# Patient Record
Sex: Male | Born: 1940
Health system: Southern US, Community
[De-identification: ages and names within clinical notes are randomized; demographics above are authoritative.]

## PROBLEM LIST (undated history)

## (undated) DIAGNOSIS — I739 Peripheral vascular disease, unspecified: Secondary | ICD-10-CM

## (undated) DIAGNOSIS — D649 Anemia, unspecified: Secondary | ICD-10-CM

## (undated) DIAGNOSIS — T7840XA Allergy, unspecified, initial encounter: Secondary | ICD-10-CM

## (undated) DIAGNOSIS — I10 Essential (primary) hypertension: Secondary | ICD-10-CM

## (undated) DIAGNOSIS — M1990 Unspecified osteoarthritis, unspecified site: Secondary | ICD-10-CM

## (undated) HISTORY — DX: Peripheral vascular disease, unspecified: I73.9

## (undated) HISTORY — DX: Allergy, unspecified, initial encounter: T78.40XA

## (undated) HISTORY — DX: Anemia, unspecified: D64.9

## (undated) HISTORY — DX: Unspecified osteoarthritis, unspecified site: M19.90

## (undated) HISTORY — DX: Essential (primary) hypertension: I10

## (undated) HISTORY — PX: NM MYOVIEW LTD: HXRAD82

---

## 2005-01-04 ENCOUNTER — Ambulatory Visit: Payer: Self-pay | Admitting: Internal Medicine

## 2005-02-12 ENCOUNTER — Ambulatory Visit: Payer: Self-pay | Admitting: Internal Medicine

## 2005-03-26 ENCOUNTER — Ambulatory Visit: Payer: Self-pay | Admitting: Internal Medicine

## 2005-07-12 ENCOUNTER — Ambulatory Visit: Payer: Self-pay | Admitting: Internal Medicine

## 2005-09-23 ENCOUNTER — Ambulatory Visit: Payer: Self-pay | Admitting: Internal Medicine

## 2005-09-23 LAB — CONVERTED CEMR LAB: PSA: 0.66 ng/mL

## 2005-10-10 ENCOUNTER — Ambulatory Visit: Payer: Self-pay | Admitting: Internal Medicine

## 2006-03-27 ENCOUNTER — Ambulatory Visit: Payer: Self-pay | Admitting: Internal Medicine

## 2006-04-04 ENCOUNTER — Ambulatory Visit: Payer: Self-pay

## 2006-04-11 ENCOUNTER — Ambulatory Visit: Payer: Self-pay | Admitting: Internal Medicine

## 2006-04-30 ENCOUNTER — Ambulatory Visit: Payer: Self-pay | Admitting: Internal Medicine

## 2006-07-16 ENCOUNTER — Ambulatory Visit: Payer: Self-pay | Admitting: Internal Medicine

## 2006-09-18 ENCOUNTER — Encounter: Payer: Self-pay | Admitting: Internal Medicine

## 2006-09-18 DIAGNOSIS — I10 Essential (primary) hypertension: Secondary | ICD-10-CM

## 2006-09-18 DIAGNOSIS — J301 Allergic rhinitis due to pollen: Secondary | ICD-10-CM | POA: Insufficient documentation

## 2006-09-18 DIAGNOSIS — D510 Vitamin B12 deficiency anemia due to intrinsic factor deficiency: Secondary | ICD-10-CM | POA: Insufficient documentation

## 2006-09-26 ENCOUNTER — Ambulatory Visit: Payer: Self-pay | Admitting: Internal Medicine

## 2006-09-29 LAB — CONVERTED CEMR LAB
Albumin: 3.7 g/dL (ref 3.5–5.2)
BUN: 19 mg/dL (ref 6–23)
Basophils Relative: 0.1 % (ref 0.0–1.0)
CO2: 29 meq/L (ref 19–32)
Calcium: 9 mg/dL (ref 8.4–10.5)
Creatinine, Ser: 1.3 mg/dL (ref 0.4–1.5)
Eosinophils Relative: 6.3 % — ABNORMAL HIGH (ref 0.0–5.0)
GFR calc non Af Amer: 59 mL/min
Monocytes Relative: 12.4 % — ABNORMAL HIGH (ref 3.0–11.0)
Neutro Abs: 3 10*3/uL (ref 1.4–7.7)
PSA: 0.7 ng/mL (ref 0.10–4.00)
Phosphorus: 3.1 mg/dL (ref 2.3–4.6)
Platelets: 235 10*3/uL (ref 150–400)
RBC: 4.55 M/uL (ref 4.22–5.81)
RDW: 12.4 % (ref 11.5–14.6)
TSH: 0.79 microintl units/mL (ref 0.35–5.50)
WBC: 5.5 10*3/uL (ref 4.5–10.5)

## 2006-10-16 ENCOUNTER — Ambulatory Visit: Payer: Self-pay | Admitting: Internal Medicine

## 2006-10-17 ENCOUNTER — Encounter (INDEPENDENT_AMBULATORY_CARE_PROVIDER_SITE_OTHER): Payer: Self-pay | Admitting: *Deleted

## 2007-03-04 ENCOUNTER — Ambulatory Visit: Payer: Self-pay | Admitting: Family Medicine

## 2007-05-29 ENCOUNTER — Ambulatory Visit: Payer: Self-pay | Admitting: Family Medicine

## 2007-07-03 ENCOUNTER — Ambulatory Visit: Payer: Self-pay | Admitting: Internal Medicine

## 2007-07-20 ENCOUNTER — Ambulatory Visit: Payer: Self-pay | Admitting: Gastroenterology

## 2008-01-05 ENCOUNTER — Ambulatory Visit: Payer: Self-pay | Admitting: Internal Medicine

## 2008-01-06 LAB — CONVERTED CEMR LAB
ALT: 31 units/L (ref 0–53)
AST: 27 units/L (ref 0–37)
Alkaline Phosphatase: 63 units/L (ref 39–117)
BUN: 20 mg/dL (ref 6–23)
Basophils Absolute: 0 10*3/uL (ref 0.0–0.1)
CO2: 28 meq/L (ref 19–32)
Calcium: 8.9 mg/dL (ref 8.4–10.5)
Creatinine, Ser: 1.5 mg/dL (ref 0.4–1.5)
Glucose, Bld: 90 mg/dL (ref 70–99)
Hemoglobin: 14.1 g/dL (ref 13.0–17.0)
Lymphocytes Relative: 20.1 % (ref 12.0–46.0)
MCHC: 35 g/dL (ref 30.0–36.0)
Monocytes Relative: 10.5 % (ref 3.0–12.0)
Neutrophils Relative %: 65.5 % (ref 43.0–77.0)
Phosphorus: 3.3 mg/dL (ref 2.3–4.6)
RBC: 4.49 M/uL (ref 4.22–5.81)
RDW: 12 % (ref 11.5–14.6)
Total Bilirubin: 1.4 mg/dL — ABNORMAL HIGH (ref 0.3–1.2)

## 2008-01-12 ENCOUNTER — Ambulatory Visit: Payer: Self-pay | Admitting: Family Medicine

## 2008-01-25 ENCOUNTER — Ambulatory Visit: Payer: Self-pay | Admitting: Family Medicine

## 2008-07-05 ENCOUNTER — Ambulatory Visit: Payer: Self-pay | Admitting: Internal Medicine

## 2008-07-05 DIAGNOSIS — M1990 Unspecified osteoarthritis, unspecified site: Secondary | ICD-10-CM | POA: Insufficient documentation

## 2008-08-15 ENCOUNTER — Ambulatory Visit: Payer: Self-pay | Admitting: Gastroenterology

## 2008-08-26 ENCOUNTER — Ambulatory Visit: Payer: Self-pay | Admitting: Gastroenterology

## 2008-08-26 ENCOUNTER — Encounter: Payer: Self-pay | Admitting: Gastroenterology

## 2008-08-29 ENCOUNTER — Encounter: Payer: Self-pay | Admitting: Gastroenterology

## 2008-10-24 ENCOUNTER — Ambulatory Visit: Payer: Self-pay | Admitting: Internal Medicine

## 2009-01-16 ENCOUNTER — Ambulatory Visit: Payer: Self-pay | Admitting: Internal Medicine

## 2009-01-17 LAB — CONVERTED CEMR LAB
ALT: 25 units/L (ref 0–53)
Albumin: 3.9 g/dL (ref 3.5–5.2)
BUN: 21 mg/dL (ref 6–23)
Basophils Relative: 0.5 % (ref 0.0–3.0)
Bilirubin, Direct: 0.1 mg/dL (ref 0.0–0.3)
Calcium: 8.9 mg/dL (ref 8.4–10.5)
Eosinophils Absolute: 0.3 10*3/uL (ref 0.0–0.7)
Glucose, Bld: 90 mg/dL (ref 70–99)
HCT: 40.8 % (ref 39.0–52.0)
Hemoglobin: 14.2 g/dL (ref 13.0–17.0)
Lymphocytes Relative: 24.8 % (ref 12.0–46.0)
Lymphs Abs: 1.5 10*3/uL (ref 0.7–4.0)
MCHC: 34.8 g/dL (ref 30.0–36.0)
MCV: 90.6 fL (ref 78.0–100.0)
Monocytes Absolute: 0.8 10*3/uL (ref 0.1–1.0)
Neutro Abs: 3.5 10*3/uL (ref 1.4–7.7)
Potassium: 4.4 meq/L (ref 3.5–5.1)
RBC: 4.51 M/uL (ref 4.22–5.81)
Total Protein: 7.3 g/dL (ref 6.0–8.3)

## 2009-02-02 ENCOUNTER — Ambulatory Visit: Payer: Self-pay | Admitting: Family Medicine

## 2009-07-18 ENCOUNTER — Ambulatory Visit: Payer: Self-pay | Admitting: Internal Medicine

## 2009-07-18 DIAGNOSIS — K5289 Other specified noninfective gastroenteritis and colitis: Secondary | ICD-10-CM

## 2009-08-09 ENCOUNTER — Ambulatory Visit: Payer: Self-pay | Admitting: Family Medicine

## 2009-08-09 DIAGNOSIS — L255 Unspecified contact dermatitis due to plants, except food: Secondary | ICD-10-CM

## 2009-08-21 ENCOUNTER — Ambulatory Visit: Payer: Self-pay | Admitting: Family Medicine

## 2010-01-17 ENCOUNTER — Ambulatory Visit: Payer: Self-pay | Admitting: Internal Medicine

## 2010-01-18 LAB — CONVERTED CEMR LAB
Albumin: 3.8 g/dL (ref 3.5–5.2)
Bilirubin, Direct: 0.1 mg/dL (ref 0.0–0.3)
Calcium: 8.9 mg/dL (ref 8.4–10.5)
Creatinine, Ser: 1.6 mg/dL — ABNORMAL HIGH (ref 0.4–1.5)
Eosinophils Absolute: 0.3 10*3/uL (ref 0.0–0.7)
Glucose, Bld: 88 mg/dL (ref 70–99)
MCHC: 34.3 g/dL (ref 30.0–36.0)
MCV: 89.8 fL (ref 78.0–100.0)
Monocytes Absolute: 0.7 10*3/uL (ref 0.1–1.0)
Neutrophils Relative %: 60.9 % (ref 43.0–77.0)
Platelets: 217 10*3/uL (ref 150.0–400.0)
Sodium: 139 meq/L (ref 135–145)
Total Bilirubin: 1.1 mg/dL (ref 0.3–1.2)
WBC: 6.3 10*3/uL (ref 4.5–10.5)

## 2010-03-06 NOTE — Assessment & Plan Note (Signed)
Summary: Darren IVEY/DLO   Vital Signs:  Patient profile:   70 year old male Height:      72 inches Weight:      188.25 pounds BMI:     25.62 Temp:     98.1 degrees F oral Pulse rate:   76 / minute Pulse rhythm:   regular BP sitting:   138 / 76  (left arm) Cuff size:   large  Vitals Entered By: Lewanda Rife LPN (August 10, 6071 12:41 PM) CC: ?Darren Atkins on legs and arms   History of Present Illness: got out in [place] recently - now on arms and legs and ankles and itches all night long  started with benadryl and calamine lotion ankles are better  arms and legs are not  still getting new spots   day of exposure close to 2 weeks ago   did wash his clothes afterwards  no animals exposed     Allergies: 1)  * Sulfa (Sulfonamides) Group 2)  Dyazide (Triamterene-Hctz)  Past History:  Past Medical History: Last updated: 07/05/2008 Allergic rhinitis Hypertension Pernicious anemia Osteoarthritis  Past Surgical History: Last updated: 10/03/2006 [place]. Myoview- apical thinning, no ischemia EF 53% 02/08  Family History: Last updated: 10/03/06 Dad died @58  of cerebral hemorrhage. Had HTN Mom died @86 --old age.  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 Walks a lot at work  Risk Factors: Smoking Status: never (2006/10/03)  Review of Systems General:  Denies fatigue, loss of appetite, and malaise. Eyes:  Denies blurring, discharge, and eye irritation. CV:  Denies palpitations. Resp:  Denies cough, shortness of breath, and wheezing. GI:  Denies nausea and vomiting. MS:  Denies joint pain. Derm:  Complains of itching and rash; denies poor wound healing. Heme:  Denies abnormal bruising  and bleeding.  Physical Exam  General:  alert and normal appearance.   Head:  normocephalic, atraumatic, and no abnormalities observed.  no rash on face  Eyes:  vision grossly intact, pupils equal, pupils round, pupils reactive to light, and no injection.   Mouth:  pharynx pink and moist.   Neck:  No deformities, masses, or tenderness noted. Lungs:  Normal respiratory effort, chest expands symmetrically. Lungs are clear to auscultation, no crackles or wheezes. Heart:  Normal rate and regular rhythm. S1 and S2 normal without gallop, murmur, click, rub or other extra sounds. Skin:  diffuse rash of erythematous vesicles over arms and legs in diff stages of healing and scab no signs of bacterial infection some in linear distribution Cervical Nodes:  No lymphadenopathy noted Psych:  normal affect, talkative and pleasant    Impression & Recommendations:  Problem # 1:  PLANT DERMATITIS (ICD-692.6) Assessment New  [place] arms and legs - not fully resolved after 2 weeks  will cover with pred 40 mg taper and update disc poss side eff- hunger/ anx / and infection risk  adv to keep clean try zyrtec for itch calamine ok  pt advised to update me if symptoms worsen or do not improve - esp if redness or fever  His updated medication list for this problem includes:    Prednisone 10 Mg Tabs (Prednisone) .Marland Kitchen... Take by mouth as directed  Orders: Prescription Created Electronically [id number])  Complete Medication List: 1)  Amlodipine Besylate 5 Mg Tabs (Amlodipine besylate) .... Take 1 tablet by mouth once a day 2)  Cyanocobalamin 1000 Mcg/ml Inj Soln (Cyanocobalamin) .... Inject one cc once monthly 3)  Fluticasone Propionate 50 Mcg/act Susp (Fluticasone propionate) .... 2 sprays each nostril once daily as needed 4)  Folic Acid 1 Mg Tabs (Folic acid) .... Take one by mouth once a day 5)  Multivitamins Tabs (Multiple vitamin) .... Take one by mouth once a day 6)  Dulcolax Stool Softener 100 Mg  Caps (Docusate sodium) .... Nightly as needed 7)  Calamine Lotn (Calamine) .... Otc as directed. 8)  Prednisone 10 Mg Tabs (Prednisone) .... Take by mouth as directed  Patient Instructions: 1)  continue the calamine lotion as needed  2)  try zyrtec 10 mg one pill daily for itch  3)  keep areas clean and dry  4)  take the prednisone as follows 5)  4 pills once daily for 3 days then  6)  3 pills once daily for 3 days then  7)  2 pills once daily for 3 days and then  8)  1 pill once daily for 3 days and then stop  9)  update me if worse or not improved in 1 weeks 10)  wash anything that touched the plant   Prescriptions: PREDNISONE 10 MG TABS (PREDNISONE) take by mouth as directed  #30 x 0   Entered and Authorized by:   Judith Part MD   Signed by:   Judith Part MD on 08/09/2009   Method used:   Electronically to        [facility] [id number]* (retail)       [address]       [place], [place]  [address]       Ph: [phone]       Fax: [phone]   RxID:   [id number]   Current Allergies (reviewed today): * SULFA (SULFONAMIDES) GROUP DYAZIDE (TRIAMTERENE-HCTZ)

## 2010-03-06 NOTE — Assessment & Plan Note (Signed)
Summary: 6 Month follow up   Vital Signs:  Patient profile:   70 year old male Weight:      187 pounds Temp:     98.3 degrees F oral Pulse rate:   64 / minute Pulse rhythm:   regular BP sitting:   140 / 80  (left arm) Cuff size:   large  Vitals Entered By: Mervin Hack CMA [place] [facility]) (July 18, 2009 10:28 AM) CC: 6 month follow-up   History of Present Illness: Doing fine Has stomach virus last week may have lost some weight with it---- couldn't eat for a while took 4-5 days to get back to normal no fever Bloating and some diarrhea  Occ checks BP--not in some time No headaches No chest pain No cheange in exercise tolerance Stays busy despite retirement---keeps cows, goat, farm  allergies have been good  No arthritis pain of note only mild in hands--no regular meds  Allergies: 1)  * Sulfa (Sulfonamides) Group 2)  Dyazide (Triamterene-Hctz)  Past History:  Past medical, surgical, family and social histories (including risk factors) reviewed for relevance to current acute and chronic problems.  Past Medical History: Reviewed history from 07/05/2008 and no changes required. Allergic rhinitis Hypertension Pernicious anemia Osteoarthritis  Past Surgical History: Reviewed history from 09/18/2006 and no changes required. [place]. Myoview- apical thinning, no ischemia EF 53% 02/08  Family History: Reviewed history from 09/18/2006 and no changes required. Dad died @58  of cerebral hemorrhage. Had HTN Mom died @86 --old age.  Had osteroporosis, HTN No CAD, DM, or cancer  Social History: Reviewed history from 01/16/2009 and no changes required. 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 Walks a lot at work  Physical Exam  General:  alert and normal appearance.   Neck:  supple,  no masses, no thyromegaly, no carotid bruits, and no cervical lymphadenopathy.   Lungs:  normal respiratory effort and normal breath sounds.   Heart:  normal rate, regular rhythm, no murmur, and no gallop.   Abdomen:  soft, non-tender, and no masses.   Msk:  no joint tenderness and no joint swelling.   Extremities:  no edema Psych:  normally interactive, good eye contact, not anxious appearing, and not depressed appearing.     Impression & Recommendations:  Problem # 1:  GASTROENTERITIS (ICD-558.9) Assessment New may just have been viral infection could have been diverticulitis though discussed criteria for evaluation and possible antibiotics if this happens again  Problem # 2:  HYPERTENSION (ICD-401.9) Assessment: Unchanged good control no changes needed  His updated medication list for this problem includes:    Amlodipine Besylate 5 Mg Tabs (Amlodipine besylate) .Marland Kitchen... Take 1 tablet by mouth once a day  BP today: 140/80 Prior BP: 128/78 (02/02/2009)  Labs Reviewed: K+: 4.4 (01/16/2009) Creat: : 1.6 (01/16/2009)     Problem # 3:  OSTEOARTHRITIS (ICD-715.90) Assessment: Unchanged mild symptoms no Rx needed  Problem # 4:  ALLERGIC RHINITIS (ICD-477.9) Assessment: Unchanged got through the season pretty well  His updated medication list for this problem includes:    Fluticasone Propionate 50 Mcg/act Susp (Fluticasone propionate) .Marland Kitchen... 2 sprays each nostril once daily as needed  Complete Medication List: 1)  Amlodipine Besylate 5 Mg Tabs (Amlodipine besylate) .... Take 1 tablet by mouth once a day 2)  Cyanocobalamin 1000 Mcg/ml Inj Soln (Cyanocobalamin) .... Inject  one cc once monthly 3)  Fluticasone Propionate 50 Mcg/act Susp (Fluticasone propionate) .... 2 sprays each nostril once daily as needed 4)  Folic Acid 1 Mg Tabs (Folic acid) .... Take one by mouth once a day 5)  Multivitamins Tabs (Multiple vitamin) .... Take one by mouth once a day 6)  Dulcolax Stool Softener  100 Mg Caps (Docusate sodium) .... Nightly as needed  Patient Instructions: 1)  Please schedule a follow-up appointment in 6 months for physical  Current Allergies (reviewed today): * SULFA (SULFONAMIDES) GROUP DYAZIDE (TRIAMTERENE-HCTZ)

## 2010-03-06 NOTE — Assessment & Plan Note (Signed)
Summary: POISON OAK/CLE   Vital Signs:  Patient profile:   70 year old male Weight:      186.13 pounds Temp:     98.9 degrees F oral Pulse rate:   94 / minute Pulse rhythm:   regular BP sitting:   178 / 81  (left arm) Cuff size:   large  Vitals Entered By: Janee Morn CMA (August 21, 2009 3:24 PM) CC: Poison oak   History of Present Illness: 70 yo here for persistent poison ivy. Seen on 7/6 for presumed poison ivy on arms, legs and ankles. Given prednisone taper.  Felt much better by day 3 of his prednisone but once he finsihed it on Friday, itching started back again. Zyrtec not helping. Itching woke him up last night. Now also has some spots on his arms, does not think the plant touched his arms.  did wash his clothes afterwards  no animals exposed   Washed all of his sheets, shoes, clothes.  Current Medications (verified): 1)  Amlodipine Besylate 5 Mg Tabs (Amlodipine Besylate) .... Take 1 Tablet By Mouth Once A Day 2)  Cyanocobalamin 1000 Mcg/ml Inj Soln (Cyanocobalamin) .... Inject One Cc Once Monthly 3)  Fluticasone Propionate 50 Mcg/act  Susp (Fluticasone Propionate) .... 2 Sprays Each Nostril Once Daily As Needed 4)  Folic Acid 1 Mg  Tabs (Folic Acid) .... Take One By Mouth Once A Day 5)  Multivitamins   Tabs (Multiple Vitamin) .... Take One By Mouth Once A Day 6)  Dulcolax Stool Softener 100 Mg  Caps (Docusate Sodium) .... Nightly As Needed 7)  Calamine  Lotn (Calamine) .... Otc As Directed. 8)  Prednisone 10 Mg  Tabs (Prednisone) .... Take By Mouth Every Morning As Directed.  Allergies: 1)  * Sulfa (Sulfonamides) Group 2)  Dyazide (Triamterene-Hctz)  Review of Systems      See HPI General:  Denies fever. GI:  Denies nausea.  Physical Exam  General:  alert and normal appearance.   Skin:  diffuse rash of erythematous vesicles over arms, ankles, and legs in diff stages of healing and scab no signs of bacterial infection some in linear distribution Psych:   normal affect, talkative and pleasant    Impression & Recommendations:  Problem # 1:  PLANT DERMATITIS (ICD-692.6) Assessment Deteriorated Discussed with pt.  Decadron IM vs oral steroid burst again. He prefers oral tablets.  Will prescirbe another course, advised oral benadryl at night. Advised to call me on Friday with update. The following medications were removed from the medication list:    Prednisone 10 Mg Tabs (Prednisone) .Marland Kitchen... Take by mouth as directed His updated medication list for this problem includes:    Prednisone 10 Mg Tabs (Prednisone) .Marland Kitchen... Take by mouth every morning as directed.  Complete Medication List: 1)  Amlodipine Besylate 5 Mg Tabs (Amlodipine besylate) .... Take 1 tablet by mouth once a day 2)  Cyanocobalamin 1000 Mcg/ml Inj Soln (Cyanocobalamin) .... Inject one cc once monthly 3)  Fluticasone Propionate 50 Mcg/act Susp (Fluticasone propionate) .... 2 sprays each nostril once daily as needed 4)  Folic Acid 1 Mg Tabs (Folic acid) .... Take one by mouth once a day 5)  Multivitamins Tabs (Multiple vitamin) .... Take one by mouth once a day 6)  Dulcolax Stool Softener 100 Mg Caps (Docusate sodium) .... Nightly as needed 7)  Calamine Lotn (Calamine) .... Otc as directed. 8)  Prednisone 10 Mg Tabs (Prednisone) .... Take by mouth every morning as directed.  Patient Instructions: 1)  continue the calamine lotion as needed  2)  try benadryl tablets at bedtime. 3)  keep areas clean and dry  4)  take the prednisone as follows 5)  4 pills once daily for 3 days then  6)  3 pills once daily for 3 days then  7)  2 pills once daily for 3 days and then  8)  1 pill once daily for 3 days and then stop  9)  update me if worse or not improved in 1 weeks 10)  wash anything that touched the plant  Prescriptions: PREDNISONE 10 MG  TABS (PREDNISONE) Take by mouth every morning as directed.  #30 x 0   Entered and Authorized by:   Ruthe Mannan MD   Signed by:   Ruthe Mannan MD on  08/21/2009   Method used:   Electronically to        [facility] [id number]* (retail)       [address]       [place], [place]  [address]       Ph: [phone]       Fax: [phone]   RxID:   [id number]   Current Allergies (reviewed today): * SULFA (SULFONAMIDES) GROUP DYAZIDE (TRIAMTERENE-HCTZ)

## 2010-03-08 NOTE — Assessment & Plan Note (Signed)
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 He helps in and out of tub, dries hair. Occ needs help even with transfers out of bed  No new concerns for him  Allergies: 1)  * Sulfa (Sulfonamides) Group 2)  Dyazide (Triamterene-Hctz)  Past History:  Past medical, surgical, family and social histories (including risk factors) reviewed for relevance to current acute and chronic problems.  Past Medical History: Reviewed history from 07/05/2008 and no changes required. Allergic rhinitis Hypertension Pernicious anemia Osteoarthritis  Past Surgical History: Reviewed history from 09/18/2006 and no changes required. [place]. Myoview- apical thinning, no ischemia EF 53% 02/08  Family History: Reviewed history from 09/18/2006 and no changes required. Dad died @58  of cerebral hemorrhage. Had HTN Mom died @86 --old age.  Had osteroporosis, HTN No CAD, DM, or cancer  Social History: Reviewed history from 01/16/2009 and no changes required. 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 Walks a lot at work  Review of Systems General:  weight up 3# sleeps okay wears seat belt . Eyes:  Denies double vision and vision loss-1 eye. ENT:  Denies decreased hearing and ringing in ears; full dentures. CV:  Denies chest pain or  discomfort, difficulty breathing at night, difficulty breathing while lying down, fainting, lightheadness, palpitations, and shortness of breath with exertion. Resp:  Denies cough and shortness of breath. GI:  Denies abdominal pain, bloody stools, change in bowel habits, dark tarry stools, indigestion, nausea, and vomiting. GU:  Complains of nocturia; denies urinary frequency and urinary hesitancy; nocturia x 1 at most No sexual problems. MS:  Denies joint pain and joint swelling. Derm:  Denies lesion(s) and rash. Neuro:  Complains of headaches; denies numbness, tingling, and weakness; occ headaches---advil cold and sinus helps. Psych:  Denies anxiety and depression. Heme:  Denies abnormal bruising and enlarge lymph nodes. Allergy:  Complains of seasonal allergies and sneezing; uses fluticasone seasonally.  Physical Exam  General:  alert and normal appearance.   Eyes:  pupils equal, pupils round, pupils reactive to light, and no optic disk abnormalities.   Ears:  R ear normal and L ear normal.   Mouth:  no erythema, no exudates, and no lesions.   Neck:  supple, no masses, no thyromegaly, no carotid bruits, and no cervical lymphadenopathy.   Lungs:  normal respiratory effort, no intercostal retractions, no accessory muscle use, and normal breath sounds.   Heart:  normal rate, regular rhythm, no murmur, and no gallop.   Abdomen:  soft, non-tender, and no masses.   Msk:  no joint tenderness and no joint swelling.   Pulses:  normal in feet Extremities:  no edema Neurologic:  alert & oriented X3, strength normal in all extremities, and gait normal.   Skin:  no rashes and no  suspicious lesions.   Axillary Nodes:  No palpable lymphadenopathy Psych:  normally interactive, good eye contact, not anxious appearing, and not depressed appearing.     Impression & Recommendations:  Problem # 1:  HEALTH MAINTENANCE EXAM (ICD-V70.0) Assessment Comment Only Healthy discussed PSA--will not do  anymore up to date on imms and colonoscopy  Problem # 2:  HYPERTENSION (ICD-401.9) Assessment: Unchanged  BP control is fine no changes needed due for labs  His updated medication list for this problem includes:    Amlodipine Besylate 5 Mg Tabs (Amlodipine besylate) .Marland Kitchen... Take 1 tablet by mouth once a day  BP today: 148/88 Prior BP: 178/81 (08/21/2009)  Labs Reviewed: K+: 4.4 (01/16/2009) Creat: : 1.6 (01/16/2009)     Orders: Venipuncture ([address]) TLB-Renal Function Panel (80069-RENAL) TLB-CBC Platelet - w/Differential (85025-CBCD) TLB-Hepatic/Liver Function Pnl (80076-HEPATIC) TLB-TSH (Thyroid Stimulating Hormone) (84443-TSH)  Problem # 3:  ANEMIA, PERNICIOUS (ICD-281.0) Assessment: Comment Only he gives himself shots monthly  His updated medication list for this problem includes:    Cyanocobalamin 1000 Mcg/ml Inj Soln (Cyanocobalamin) ..... Inject one cc once monthly    Folic Acid 1 Mg Tabs (Folic acid) .Marland Kitchen... Take one by mouth once a day  Complete Medication List: 1)  Amlodipine Besylate 5 Mg Tabs (Amlodipine besylate) .... Take 1 tablet by mouth once a day 2)  Cyanocobalamin 1000 Mcg/ml Inj Soln (Cyanocobalamin) .... Inject one cc once monthly 3)  Fluticasone Propionate 50 Mcg/act Susp (Fluticasone propionate) .... 2 sprays each nostril once daily as needed 4)  Folic Acid 1 Mg Tabs (Folic acid) .... Take one by mouth once a day 5)  Multivitamins Tabs (Multiple vitamin) .... Take one by mouth once a day 6)  Dulcolax Stool Softener 100 Mg Caps (Docusate sodium) .... Nightly as needed 7)  Calamine Lotn (Calamine) .... Otc as directed.  Other Orders: Flu Vaccine 62yrs + MEDICARE PATIENTS (U0454) Administration Flu vaccine - MCR (U9811)  Patient Instructions: 1)  Please schedule a follow-up appointment in 6 months .    Orders Added: 1)  Flu Vaccine 31yrs + MEDICARE PATIENTS [Q2039] 2)  Administration Flu vaccine - MCR [G0008] 3)  Est. Patient 65& > [99397] 4)   Venipuncture [36415] 5)  TLB-Renal Function Panel [80069-RENAL] 6)  TLB-CBC Platelet - w/Differential [85025-CBCD] 7)  TLB-Hepatic/Liver Function Pnl [80076-HEPATIC] 8)  TLB-TSH (Thyroid Stimulating Hormone) [91478-GNF]  Flu Vaccine Consent Questions     Do you have a history of severe allergic reactions to this vaccine? no    Any prior history of allergic reactions to egg and/or gelatin? no    Do you have a sensitivity to the preservative Thimersol? no    Do you have a past history of Guillan-Barre Syndrome? no    Do you currently have an acute febrile illness? no    Have you ever had a severe reaction to latex? no    Vaccine information given and explained to patient? yes    Are you currently pregnant? no    Lot Number:[id number]   Exp Date:08/04/2010   Site Given  Left Deltoid IM Current Allergies (reviewed today): * SULFA (SULFONAMIDES) GROUP DYAZIDE (TRIAMTERENE-HCTZ)    .[email or web address]

## 2010-05-10 ENCOUNTER — Encounter: Payer: Self-pay | Admitting: Internal Medicine

## 2010-05-14 ENCOUNTER — Ambulatory Visit (INDEPENDENT_AMBULATORY_CARE_PROVIDER_SITE_OTHER): Payer: Medicare Other | Admitting: Internal Medicine

## 2010-05-14 ENCOUNTER — Encounter: Payer: Self-pay | Admitting: Internal Medicine

## 2010-05-14 VITALS — BP 140/80 | HR 74 | Temp 98.4°F | Ht 72.0 in | Wt 188.0 lb

## 2010-05-14 DIAGNOSIS — N529 Male erectile dysfunction, unspecified: Secondary | ICD-10-CM | POA: Insufficient documentation

## 2010-05-14 MED ORDER — VARDENAFIL HCL 20 MG PO TABS: 10.0000 mg | ORAL_TABLET | Freq: Every day | ORAL | Status: DC | PRN

## 2010-05-14 NOTE — Progress Notes (Signed)
  Subjective:    Patient ID: Darren Atkins, male    DOB: [birth year], 70 y.o.   MRN: [id number]  HPI Having some trouble with erection problems Slowly worsening over 6 months or more Over the past few weeks, unable to penetrate at all now Had been progressively worsening but now really bad  No urinary problems No daytime urgency or frequency Nocturia is not an issue  Past Medical History  Diagnosis Date  . Allergy   . Hypertension   . Anemia   . Osteoarthritis     Past Surgical History  Procedure Date  . Nm myoview ltd     [place]. Myoview- apical thinning, no ischemia EF 53% 02/08    Family History  Problem Relation Age of Onset  . Hypertension Mother   . Osteoporosis Mother   . Hypertension Father   . Coronary artery disease Neg Hx   . Diabetes Neg Hx   . Cancer Neg Hx     History   Social History  . Marital Status: Married    Spouse Name: N/A    Number of Children: 1  . Years of Education: N/A   Occupational History  . 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 Smoking status: Never Smoker   . Smokeless tobacco: Former Neurosurgeon    Types: Chew    Quit date: 02/05/1983  . Alcohol Use: No  . Drug Use: Not on file  . Sexually Active: Not on file   Other Topics Concern  . Not on file   Social History Narrative   Does pushups and situps every day. Walks a lot at work   Review of [employer] enough Appetite is gone Relationship is still strong despite her disability    Objective:   Physical Exam  Psychiatric: He has a normal mood and affect. His behavior is normal. Judgment and thought content normal.          Assessment & Plan:

## 2010-07-13 ENCOUNTER — Ambulatory Visit (INDEPENDENT_AMBULATORY_CARE_PROVIDER_SITE_OTHER): Payer: Medicare Other | Admitting: Internal Medicine

## 2010-07-13 ENCOUNTER — Encounter: Payer: Self-pay | Admitting: Internal Medicine

## 2010-07-13 DIAGNOSIS — M1990 Unspecified osteoarthritis, unspecified site: Secondary | ICD-10-CM

## 2010-07-13 DIAGNOSIS — J309 Allergic rhinitis, unspecified: Secondary | ICD-10-CM

## 2010-07-13 DIAGNOSIS — I10 Essential (primary) hypertension: Secondary | ICD-10-CM

## 2010-07-13 DIAGNOSIS — N529 Male erectile dysfunction, unspecified: Secondary | ICD-10-CM

## 2010-07-13 DIAGNOSIS — D510 Vitamin B12 deficiency anemia due to intrinsic factor deficiency: Secondary | ICD-10-CM

## 2010-07-13 NOTE — Assessment & Plan Note (Signed)
Mild symptoms Tylenol only for Rx

## 2010-07-13 NOTE — Assessment & Plan Note (Signed)
Still gives himself the injections

## 2010-07-13 NOTE — Assessment & Plan Note (Signed)
Not a bad spring but seems to have secondary infection now---likely viral If worsens next week, would Rx with amoxil

## 2010-07-13 NOTE — Assessment & Plan Note (Signed)
BP Readings from Last 3 Encounters:  07/13/10 140/80  05/14/10 140/80  01/17/10 148/88   Good control No changes

## 2010-07-13 NOTE — Assessment & Plan Note (Signed)
levitra works well but hasn't needed it as much now

## 2010-07-13 NOTE — Progress Notes (Signed)
Subjective:    Patient ID: Darren Atkins, male    DOB: Dec 09, 1940, 70 y.o.   MRN: [id number]  HPI He is doing fine Has a touch of sinus infection--using advil cold and sinus  Uses the fluticasone bid at times now Gums are sore Started 2-3 days and is some better  Allergies not that bad this spring  Monitors BP at home Usually okay---doesn't remember systolic but diastolic under 90 No chest pain No SOB No edema  Occ mild arthritis pain Tylenol arthritis may help some No limitations in activity Notes esp CMC pain at times  Still gives vitamin b12 shots  Current Outpatient Prescriptions on File Prior to Visit  Medication Sig Dispense Refill  . amLODipine (NORVASC) 5 MG tablet Take 5 mg by mouth daily.        . cyanocobalamin (,VITAMIN B-12,) 1000 MCG/ML injection Inject 1,000 mcg into the muscle every 30 (thirty) days.        Marland Kitchen docusate sodium (COLACE) 100 MG capsule Take 100 mg by mouth at bedtime as needed.        . fluticasone (FLONASE) 50 MCG/ACT nasal spray 2 sprays by Nasal route daily as needed.        . folic acid (FOLVITE) 1 MG tablet Take 1 mg by mouth daily.        . Multiple Vitamin (MULTIVITAMIN) capsule Take 1 capsule by mouth daily.        Marland Kitchen DISCONTD: calamine lotion Apply topically as needed.         Past Medical History  Diagnosis Date  . Allergy   . Hypertension   . Anemia   . Osteoarthritis     Past Surgical History  Procedure Date  . Nm myoview ltd     Aden. Myoview- apical thinning, no ischemia EF 53% 02/08    Family History  Problem Relation Age of Onset  . Hypertension Mother   . Osteoporosis Mother   . Hypertension Father   . Coronary artery disease Neg Hx   . Diabetes Neg Hx   . Cancer Neg Hx     History   Social History  . Marital Status: Married    Spouse Name: N/A    Number of Children: 1  . Years of Education: N/A   Occupational History  . 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 Smoking status: Never Smoker   . Smokeless tobacco: Former Neurosurgeon    Types: Chew    Quit date: 02/05/1983  . Alcohol Use: No  . Drug Use: Not on file  . Sexually Active: Not on file   Other Topics Concern  . Not on file   Social History Narrative   Does pushups and situps every day. Walks a lot at work   Review of Systems Eats fine [employer] is stable Sleeps okay Still with same caregiving responsibilities for wife----pursuing deep brain stimulation    Objective:   Physical Exam  Constitutional: He appears well-developed and well-nourished. No distress.  HENT:  Mouth/Throat: Oropharynx is clear and moist. No oropharyngeal exudate.       TMs normal Marked inflammation R>L nare  Neck: Normal range of motion. Neck supple. No thyromegaly present.  Musculoskeletal: Normal range of motion. He exhibits no edema and no tenderness.  Lymphadenopathy:    He has no cervical adenopathy.  Psychiatric: He has a normal mood and affect.  His behavior is normal. Thought content normal.          Assessment & Plan:

## 2010-07-20 ENCOUNTER — Ambulatory Visit (INDEPENDENT_AMBULATORY_CARE_PROVIDER_SITE_OTHER): Payer: Medicare Other | Admitting: Family Medicine

## 2010-07-20 ENCOUNTER — Encounter: Payer: Self-pay | Admitting: Family Medicine

## 2010-07-20 VITALS — BP 148/80 | HR 80 | Temp 98.7°F | Ht 72.0 in | Wt 187.0 lb

## 2010-07-20 DIAGNOSIS — J019 Acute sinusitis, unspecified: Secondary | ICD-10-CM

## 2010-07-20 MED ORDER — AMOXICILLIN-POT CLAVULANATE 875-125 MG PO TABS: 1.0000 | ORAL_TABLET | Freq: Two times a day (BID) | ORAL | Status: AC

## 2010-07-20 NOTE — Patient Instructions (Signed)
You have a sinus infection. Take medicine as prescribed: augmentin twice daily for 10 days. Push fluids and plenty of rest.  Continue ibuprofen. Nasal saline irrigation to help drain sinuses.  Continue flonase. May use simple mucinex or guaifenesin with plenty of fluid to help mobilize mucous. Return if fever >101.5, trouble opening/closing mouth, difficulty swallowing, or worsening.

## 2010-07-20 NOTE — Assessment & Plan Note (Signed)
Recent worsening after initial improvement. See pt instructions for supportive care. Treat with augmentin twice daily x 10 days, update if not imporving.

## 2010-07-20 NOTE — Progress Notes (Signed)
  Subjective:    Patient ID: Darren Atkins, male    DOB: 1940-12-30, 70 y.o.   MRN: [id number]  HPI CC: sinus infx?  1+ wk h/o sinus sxs.  Started using advil cold/sinus.  Saw PCP for CPE last week.  Advised if not better to call him.  Things got better, then last night worsening again.  Thinks got into ragweed.  Slept sitting up.  Pain in sinuses on L side.  + congestion bilaterally.  Using flonase as well.  Not using nasal saline.  No abd pain, f/c/n/v/d, new rashes.  No ear pain or tooth pain.  H/o sinus infections in past.  No sick contacts at home, no smokers at home.  No asthma, COPD.  + h/o allergies.  Review of Systems Per HPI    Objective:   Physical Exam  Nursing note and vitals reviewed. Constitutional: He appears well-developed and well-nourished. No distress.  HENT:  Head: Normocephalic and atraumatic.  Right Ear: Tympanic membrane, external ear and ear canal normal.  Left Ear: Tympanic membrane, external ear and ear canal normal.  Nose: Nose normal. No mucosal edema or rhinorrhea. Right sinus exhibits no maxillary sinus tenderness and no frontal sinus tenderness. Left sinus exhibits no maxillary sinus tenderness and no frontal sinus tenderness.  Mouth/Throat: Mucous membranes are normal. Posterior oropharyngeal erythema present. No oropharyngeal exudate, posterior oropharyngeal edema or tonsillar abscesses.       Sinuses congested  Eyes: Conjunctivae and EOM are normal. Pupils are equal, round, and reactive to light. No scleral icterus.  Neck: Normal range of motion. Neck supple. No thyromegaly present.  Cardiovascular: Normal rate, regular rhythm, normal heart sounds and intact distal pulses.   No murmur heard. Pulmonary/Chest: Effort normal and breath sounds normal. No respiratory distress. He has no wheezes. He has no rales.  Lymphadenopathy:    He has no cervical adenopathy.  Skin: Skin is warm and dry. No rash noted.          Assessment & Plan:

## 2010-09-17 ENCOUNTER — Encounter: Payer: Self-pay | Admitting: Family Medicine

## 2010-09-17 ENCOUNTER — Ambulatory Visit (INDEPENDENT_AMBULATORY_CARE_PROVIDER_SITE_OTHER): Payer: Medicare Other | Admitting: Family Medicine

## 2010-09-17 VITALS — BP 156/82 | HR 64 | Temp 98.3°F | Wt 188.8 lb

## 2010-09-17 DIAGNOSIS — M7042 Prepatellar bursitis, left knee: Secondary | ICD-10-CM | POA: Insufficient documentation

## 2010-09-17 DIAGNOSIS — M7040 Prepatellar bursitis, unspecified knee: Secondary | ICD-10-CM

## 2010-09-17 MED ORDER — NAPROXEN 500 MG PO TABS: ORAL_TABLET | ORAL | Status: DC

## 2010-09-17 NOTE — Assessment & Plan Note (Addendum)
Anticipate due to trauma. Aspiration performed - bloody.  Likely trauma. Fluid not sent for analysis. Discussed NSAID use, use of knee brace to help avoid future return.

## 2010-09-17 NOTE — Patient Instructions (Signed)
Likely from repetitive clutch use. Drained today, sent for analysis. Treat with compression bandage for 24-36 hours, then consider using pull on knee brace or neoprene knee brace. Anti inflammatory for next few days (sent to pharmacy.)  Ice to knee as well as keeping elevated. Update [place] if any concerns. Prepatellar Bursitis with Rehab    Bursitis is a condition that is characterized by inflammation of a bursa. Kateri Mc exists in many areas of the body. They are fluid filled sacs that lie between a soft tissue (skin, tendon, or ligament) and a bone, and they reduce friction between the structures as well as the stress placed on the soft tissue. Prepatellar bursitis is inflammation of the bursa that lies between the skin and the kneecap (patella). This condition often causes pain over the patella.   SYMPTOMS  Pain, tenderness, and/or inflammation over the patella.  Pain that worsens with movement of the knee joint.  Decreased range of motion for the knee joint.  A crackling sound (crepitation) when the bursa is moved or touched.  Occasionally, painless swelling of the bursa.  Fever (when infected).   CAUSES  Bursitis is caused by damage to the bursa, which results in an inflammatory response. Common mechanisms of injury include:  Direct trauma to the front of the knee.  Repetitive and/ or stressful use of the knee.   RISK INCREASES WITH   Activities in which kneeling and/or falling on one's knees is likely (volleyball or football).  Repetitive and stressful training, especially if it involves running on hills.  Improper training techniques, such as a sudden increase in the intensity, frequency or duration of training.  Failure to warm-up properly before activity.  Poor technique.  Artificial turf.   PREVENTIVE MEASURES   Avoid kneeling or falling on your knees.  Warm up and stretch properly before activity.  Allow for adequate recovery between workouts.  Maintain  physical fitness: l Strength, flexibility, and endurance. l Cardiovascular fitness.  Learn and use proper technique. When possible, a have coach correct improper technique.  Wear properly fitted and padded protective equipment (knee pads).   PROGNOSIS If treated properly, then the symptoms of prepatellar bursitis usually resolve within 2 weeks.   POSSIBLE COMPLICATIONS   Recurrent symptoms that result in a chronic problem.  Prolonged healing time, if improperly treated or re-injured.  Limited range of motion.  Infection of bursa.  Chronic inflammation or scarring of bursa.   GENERAL TREATMENT CONSIDERATIONS  Treatment initially involves the use of ice and medication to help reduce pain and inflammation. The use of strengthening and stretching exercises may help reduce pain with activity, especially those of the quadriceps and hamstring muscles. These exercises may be performed at home or with referral to a therapist. Your caregiver may recommend kneepads when you return to playing sports, in order to reduce the stress on the prepatellar bursa. If symptoms persist despite treatment, then your caregiver may drain fluid out with a needle (aspirate) the bursa. If symptoms persist for greater than 6 months despite non-surgical (conservative) treatment, then surgery may be recommended to remove the bursa.    MEDICATION:   If pain medication is necessary, then nonsteroidal anti-inflammatory medications, such as aspirin and ibuprofen, or other minor pain relievers, such as acetaminophen, are often recommended.   Do not take pain medication for 7 days before surgery.   Prescription pain relievers may be given if deemed necessary by your caregiver. Use only as directed and only as much as you need.  Corticosteroid  injections may be given by your caregiver. These injections should be reserved for the most serious cases, because they may only be given a certain number of times.   HEAT AND  COLD:   Cold treatment (icing) relieves pain and reduces inflammation. Cold treatment should be applied for 10 to 15 minutes every 2 to 3 hours for inflammation and pain and immediately after any activity that aggravates your symptoms. Use ice packs or massage the area with a piece of ice (ice massage).  Heat treatment may be used prior to performing the stretching and strengthening activities prescribed by your caregiver, physical therapist, or athletic trainer. Use a heat pack or soak the injury in warm water.   SEEK MEDICAL TREATMENT IF:  Treatment seems to offer no benefit, or the condition worsens.  Any medications produce adverse side effects.    Document Released: 01/21/2005  Document Re-Released: 02/12/2009 [facility] Patient Information 2011 [place], [place].

## 2010-09-17 NOTE — Progress Notes (Signed)
  Subjective:    Patient ID: ILEY BREEDEN, male    DOB: [birth year], 70 y.o.   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  After 2d, noticed L kneecap swelling.  No pain.  Swelling remained.  Feels tight.  Some warmth.  No redness.  Hasn't tried anything to bring swelling down.  No h/o kneeling  In past has had knee swelling when walking up stairs with warmth to knee, never pain, never red.  No fevers/chills, nausea, rashes, other joints swollen, muscle pains.  Has been told has thumb arthritis bilaterally.  Review of Systems Per HPI    Objective:   Physical Exam  Nursing note and vitals reviewed. Constitutional: He appears well-developed and well-nourished. No distress.  Musculoskeletal:       Right knee: Normal.       Left knee: He exhibits swelling, effusion and abnormal patellar mobility (tight). He exhibits normal range of motion. no tenderness found.       swollen prepatellar bursa  Skin: Skin is warm and dry. No rash noted.  Psychiatric: He has a normal mood and affect.     IC obtained.  Area prepped with betadine.  Anesthesia with ethyl chloride.  21g needle used to aspirate prepatellar bursa at inferior margin, 21cc bloody fluid aspirated.  Dressed with compression bandage.  Pt tolerated procedure well.     Assessment & Plan:

## 2010-09-18 ENCOUNTER — Telehealth: Payer: Self-pay | Admitting: *Deleted

## 2010-09-18 NOTE — Telephone Encounter (Signed)
Notified patient to use OTC ibuprofen. He was asking if compression stockings would be okay for him to use. He said that his whole lower leg is now swollen and was thinking that may be better than the knee brace. He did say he didn't use any ice or keep it elevated yesterday or today. He just kept on doing what he always has. 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 I told him I wanted to ask before I told him one way or the other about the stockings. He denies pain, redness or warmth to his leg and no fever, just mild swelling.

## 2010-09-18 NOTE — Telephone Encounter (Signed)
Would recommend OTC ibuprofen 400mg  at a time 2 times a day for next week with food if able to tolerate that.  If not, then just tylenol as needed.  Will add naprosyn to intolerance list.

## 2010-09-18 NOTE — Telephone Encounter (Signed)
Pt states he was given naprosyn yesterday and he cant take this- it's causing insomnia, stomach ache and making him feel keyed up.  Her is asking that something else be called to [facility] in [place].

## 2010-09-18 NOTE — Telephone Encounter (Signed)
Would really recommend off leg at least temporarily while aspiration site heals. Recommend brace more than compression stocking. Needs to keep compression bandage on through the day. 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

## 2010-09-18 NOTE — Telephone Encounter (Signed)
Patient notified. He said he would try to stay off the tractor. I reminded him that if that's what caused the problem, it wasn't going to help it get better. He understood but said he just doesn't like to sit around. He will use the brace without the kneecap cutout as directed by Dr. Sharen Hones and call with any problems or questions.

## 2010-09-20 ENCOUNTER — Ambulatory Visit: Payer: Medicare Other | Admitting: Internal Medicine

## 2010-09-21 ENCOUNTER — Encounter: Payer: Self-pay | Admitting: Internal Medicine

## 2010-09-21 ENCOUNTER — Ambulatory Visit: Payer: Medicare Other | Admitting: Family Medicine

## 2010-09-21 ENCOUNTER — Ambulatory Visit (INDEPENDENT_AMBULATORY_CARE_PROVIDER_SITE_OTHER): Payer: Medicare Other | Admitting: Internal Medicine

## 2010-09-21 VITALS — BP 139/69 | HR 73 | Temp 98.7°F | Ht 72.0 in | Wt 190.0 lb

## 2010-09-21 DIAGNOSIS — M7042 Prepatellar bursitis, left knee: Secondary | ICD-10-CM

## 2010-09-21 DIAGNOSIS — M7040 Prepatellar bursitis, unspecified knee: Secondary | ICD-10-CM

## 2010-09-21 NOTE — Assessment & Plan Note (Signed)
Fluid has reaccumulated--not surprising No infection Discussed that the time course for this can be very protracted (weeks - months) Discussed signs of secondary infection Will use ACE wrap when working for compression and protection

## 2010-09-21 NOTE — Progress Notes (Signed)
  Subjective:    Patient ID: Darren Atkins, male    DOB: 20-Feb-1940, 70 y.o.   MRN: [id number]  HPI Reviewed Dr Timoteo Expose visit  He feels more fluid could have been taken out Couldn't take the naprosyn On ibuprofen--400 bid Has tried elevation and ice Still has the fluid reaccumulating--same place No internal knee swelling that he can tell  Some warmth  No tenderness  Current Outpatient Prescriptions on File Prior to Visit  Medication Sig Dispense Refill  . amLODipine (NORVASC) 5 MG tablet Take 5 mg by mouth daily.        . cyanocobalamin (,VITAMIN B-12,) 1000 MCG/ML injection Inject 1,000 mcg into the muscle every 30 (thirty) days.        Marland Kitchen docusate sodium (COLACE) 100 MG capsule Take 100 mg by mouth at bedtime as needed.        . fluticasone (FLONASE) 50 MCG/ACT nasal spray 2 sprays by Nasal route daily as needed.        . folic acid (FOLVITE) 1 MG tablet Take 1 mg by mouth daily.        . Multiple Vitamin (MULTIVITAMIN) capsule Take 1 capsule by mouth daily.          Allergies  Allergen Reactions  . Hydrochlorothiazide W/Triamterene     REACTION: Palps  . Naprosyn (Naproxen) Other (See Comments)    Insomnia, stomach upset  . Sulfonamide Derivatives     REACTION: unspecified    Past Medical History  Diagnosis Date  . Allergy   . Hypertension   . Anemia   . Osteoarthritis     Past Surgical History  Procedure Date  . Nm myoview ltd     Aden. Myoview- apical thinning, no ischemia EF 53% 02/08    Family History  Problem Relation Age of Onset  . Hypertension Mother   . Osteoporosis Mother   . Hypertension Father   . Coronary artery disease Neg Hx   . Diabetes Neg Hx   . Cancer Neg Hx     History   Social History  . Marital Status: Married    Spouse Name: N/A    Number of Children: 1  . Years of Education: N/A   Occupational History  . 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 Smoking status: Never Smoker   . Smokeless tobacco: Former Neurosurgeon    Types: Chew    Quit date: 02/05/1983  . Alcohol Use: No  . Drug Use: Not on file  . Sexually Active: Not on file   Other Topics Concern  . Not on file   Social History Narrative   Does pushups and situps every day. Walks a lot at work   Review of Systems No fever No nausea or change in appetite    Objective:   Physical Exam  Constitutional: He appears well-developed and well-nourished. No distress.  Musculoskeletal:       Swollen tense prepatellar bursa on the left No sig tenderness or redness Slightly warm          Assessment & Plan:

## 2010-09-24 ENCOUNTER — Other Ambulatory Visit: Payer: Self-pay | Admitting: Internal Medicine

## 2010-11-29 ENCOUNTER — Other Ambulatory Visit: Payer: Self-pay | Admitting: Internal Medicine

## 2011-01-16 ENCOUNTER — Encounter: Payer: Self-pay | Admitting: Internal Medicine

## 2011-01-16 ENCOUNTER — Ambulatory Visit (INDEPENDENT_AMBULATORY_CARE_PROVIDER_SITE_OTHER): Payer: Medicare Other | Admitting: Internal Medicine

## 2011-01-16 VITALS — BP 158/89 | HR 70 | Temp 97.9°F | Ht 72.0 in | Wt 191.0 lb

## 2011-01-16 DIAGNOSIS — I10 Essential (primary) hypertension: Secondary | ICD-10-CM

## 2011-01-16 DIAGNOSIS — J309 Allergic rhinitis, unspecified: Secondary | ICD-10-CM

## 2011-01-16 DIAGNOSIS — D510 Vitamin B12 deficiency anemia due to intrinsic factor deficiency: Secondary | ICD-10-CM

## 2011-01-16 DIAGNOSIS — Z0000 Encounter for general adult medical examination without abnormal findings: Secondary | ICD-10-CM | POA: Insufficient documentation

## 2011-01-16 LAB — CBC WITH DIFFERENTIAL/PLATELET
Basophils Relative: 0.2 % (ref 0.0–3.0)
Eosinophils Relative: 3 % (ref 0.0–5.0)
HCT: 43 % (ref 39.0–52.0)
Lymphs Abs: 1.4 10*3/uL (ref 0.7–4.0)
MCV: 90.9 fl (ref 78.0–100.0)
Monocytes Absolute: 0.7 10*3/uL (ref 0.1–1.0)
Monocytes Relative: 9.9 % (ref 3.0–12.0)
Neutrophils Relative %: 66.4 % (ref 43.0–77.0)
Platelets: 216 10*3/uL (ref 150.0–400.0)
RBC: 4.73 Mil/uL (ref 4.22–5.81)
WBC: 6.8 10*3/uL (ref 4.5–10.5)

## 2011-01-16 LAB — BASIC METABOLIC PANEL
BUN: 24 mg/dL — ABNORMAL HIGH (ref 6–23)
Chloride: 106 mEq/L (ref 96–112)
GFR: 51.13 mL/min — ABNORMAL LOW (ref >60.00)
Potassium: 4.2 mEq/L (ref 3.5–5.1)

## 2011-01-16 LAB — HEPATIC FUNCTION PANEL
ALT: 27 U/L (ref 0–53)
AST: 26 U/L (ref 0–37)
Alkaline Phosphatase: 74 U/L (ref 39–117)
Total Bilirubin: 1.4 mg/dL — ABNORMAL HIGH (ref 0.3–1.2)

## 2011-01-16 LAB — TSH: TSH: 1 u[IU]/mL (ref 0.35–5.50)

## 2011-01-16 LAB — VITAMIN B12: Vitamin B-12: 812 pg/mL (ref 211–911)

## 2011-01-16 NOTE — Progress Notes (Signed)
Subjective:    Patient ID: Darren Atkins, male    DOB: [birth year], 70 y.o.   MRN: [id number]  HPI Here for physical Reviewed the Medicare Wellness form No falls No depressed mood or anhedonia No cognitive changes  Still with stress with wife Further eval for her mild stroke Still waiting for deep brain stimulation procedure  No other concerns  Current Outpatient Prescriptions on File Prior to Visit  Medication Sig Dispense Refill  . amLODipine (NORVASC) 5 MG tablet Take 5 mg by mouth daily.        . cyanocobalamin (,VITAMIN B-12,) 1000 MCG/ML injection INJECT ONE ML IM ONCE A MONTH  10 mL  0  . docusate sodium (COLACE) 100 MG capsule Take 100 mg by mouth at bedtime as needed.        . fluticasone (FLONASE) 50 MCG/ACT nasal spray USE TWO SPRAYS IN EACH NOSTRIL EVERY DAY  16 g  10  . folic acid (FOLVITE) 1 MG tablet Take 1 mg by mouth daily.        . Multiple Vitamin (MULTIVITAMIN) capsule Take 1 capsule by mouth daily.          Allergies  Allergen Reactions  . Hydrochlorothiazide W/Triamterene     REACTION: Palps  . Naprosyn (Naproxen) Other (See Comments)    Insomnia, stomach upset  . Sulfonamide Derivatives     REACTION: unspecified    Past Medical History  Diagnosis Date  . Allergy   . Hypertension   . Anemia   . Osteoarthritis     Past Surgical History  Procedure Date  . Nm myoview ltd     Aden. Myoview- apical thinning, no ischemia EF 53% 02/08    Family History  Problem Relation Age of Onset  . Hypertension Mother   . Osteoporosis Mother   . Hypertension Father   . Coronary artery disease Neg Hx   . Diabetes Neg Hx   . Cancer Neg Hx     History   Social History  . Marital Status: Married    Spouse Name: N/A    Number of Children: 1  . Years of Education: N/A   Occupational History  . 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 Smoking status: Never Smoker     . Smokeless tobacco: Former Neurosurgeon    Types: Chew    Quit date: 02/05/1983  . Alcohol Use: No  . Drug Use: Not on file  . Sexually Active: Not on file   Other Topics Concern  . Not on file   Social History Narrative   Does pushups and situps every day. Walks a lot at work   Review of Systems  Constitutional: Negative for fatigue and unexpected weight change.       Wears seat belt  HENT: Positive for congestion and rhinorrhea. Negative for hearing loss, dental problem and tinnitus.        Ongoing allergy issues Uses OTC meds and flonase Full dentures  Eyes: Negative for visual disturbance.       No diplopia or unilateral vision loss  Respiratory: Negative for cough, chest tightness and shortness of breath.   Cardiovascular: Negative for chest pain, palpitations and leg swelling.  Gastrointestinal: Positive for constipation. Negative for nausea, vomiting, abdominal pain and blood in stool.       No heartburn Uses senekot with good success  Genitourinary: Negative for dysuria,  urgency, frequency and difficulty urinating.       Rarely uses levitra  Musculoskeletal: Negative for back pain, joint swelling and arthralgias.  Skin: Negative for rash.       Has a few scaly areas on face---sees Dr Orson Aloe  Neurological: Positive for headaches. Negative for dizziness, syncope, weakness, light-headedness and numbness.       Regular sinus headaches---OTC meds help  Hematological: Negative for adenopathy. Does not bruise/bleed easily.  Psychiatric/Behavioral: Negative for sleep disturbance and dysphoric mood. The patient is not nervous/anxious.        Objective:   Physical Exam  Constitutional: He is oriented to person, place, and time. He appears well-developed and well-nourished. No distress.  HENT:  Head: Normocephalic and atraumatic.  Right Ear: External ear normal.  Left Ear: External ear normal.  Mouth/Throat: Oropharynx is clear and moist. No oropharyngeal exudate.       TMs  normal  Eyes: Conjunctivae and EOM are normal. Pupils are equal, round, and reactive to light.  Neck: Normal range of motion. Neck supple. No thyromegaly present.  Cardiovascular: Normal rate, regular rhythm, normal heart sounds and intact distal pulses.  Exam reveals no gallop.   No murmur heard. Pulmonary/Chest: Effort normal and breath sounds normal. No respiratory distress. He has no wheezes. He has no rales.  Abdominal: Soft. There is no tenderness.  Musculoskeletal: Normal range of motion. He exhibits no edema and no tenderness.  Lymphadenopathy:    He has no cervical adenopathy.  Neurological: He is alert and oriented to person, place, and time.       President-- "Marylene Buerger" [phone] [id number] Recall  3/3  Skin: Skin is warm. No rash noted.       Several facial scaly areas (going to derm)  Psychiatric: He has a normal mood and affect. His behavior is normal. Judgment and thought content normal.          Assessment & Plan:

## 2011-01-16 NOTE — Assessment & Plan Note (Signed)
Satisfied with regimen May want to consider Neti pot

## 2011-01-16 NOTE — Assessment & Plan Note (Signed)
Generally healthy No concerns on wellness check Rx for zostavax NO PSA after discussion

## 2011-01-16 NOTE — Assessment & Plan Note (Signed)
BP Readings from Last 3 Encounters:  01/16/11 158/89  09/21/10 139/69  09/17/10 156/82   Up at times He will monitor at home Unclear if more meds needed if systolic BP under 160 at his age Will consider diuretic if stays up

## 2011-01-18 ENCOUNTER — Encounter: Payer: Medicare Other | Admitting: Internal Medicine

## 2011-01-23 ENCOUNTER — Ambulatory Visit: Payer: Medicare Other | Admitting: Internal Medicine

## 2011-01-30 ENCOUNTER — Other Ambulatory Visit: Payer: Self-pay | Admitting: Internal Medicine

## 2011-04-01 ENCOUNTER — Ambulatory Visit (INDEPENDENT_AMBULATORY_CARE_PROVIDER_SITE_OTHER): Payer: Medicare Other | Admitting: Family Medicine

## 2011-04-01 ENCOUNTER — Encounter: Payer: Self-pay | Admitting: Family Medicine

## 2011-04-01 VITALS — BP 144/74 | HR 67 | Temp 98.3°F | Wt 193.0 lb

## 2011-04-01 DIAGNOSIS — J069 Acute upper respiratory infection, unspecified: Secondary | ICD-10-CM

## 2011-04-01 MED ORDER — AMOXICILLIN 875 MG PO TABS: 875.0000 mg | ORAL_TABLET | Freq: Two times a day (BID) | ORAL | Status: AC

## 2011-04-01 NOTE — Patient Instructions (Signed)
Drink plenty of fluids, take tylenol as needed, and gargle with warm salt water for your throat.  Use the flonase.  This should gradually improve.  Take care.  Let us know if you have other concerns.  Start the amoxil in a few days if not better.

## 2011-04-01 NOTE — Progress Notes (Signed)
duration of symptoms: last few days Rhinorrhea:yes and post nasal gtt Congestion: no ear pain: no sore throat: mild from postnasal gtt cough:no myalgias:no He had 4 amoxil left over, stated them over the weekend No HA, no fever He's better than he was over the weekend.   Nonsmoker.  Darren Atkins.   ROS: See HPI.  Otherwise negative.    Meds, vitals, and allergies reviewed.   GEN: nad, alert and oriented HEENT: mucous membranes moist, TM w/o erythema, nasal epithelium injected and stuffy, more on R side then L side, OP with cobblestoning, sinuses not ttp NECK: supple w/o LA CV: rrr. PULM: ctab, no inc wob ABD: soft, +bs EXT: no edema

## 2011-04-01 NOTE — Assessment & Plan Note (Addendum)
Hold abx for now, continue with other meds.  Supportive tx o/w. Nontoxic. He agrees.  I think the improvement wasn't related to the amoxil.  He'll likely improve and then can shred the rx that was printed.

## 2011-04-07 ENCOUNTER — Other Ambulatory Visit: Payer: Self-pay | Admitting: Internal Medicine

## 2011-07-23 ENCOUNTER — Ambulatory Visit: Payer: Medicare Other | Admitting: Internal Medicine

## 2011-07-23 DIAGNOSIS — Z0289 Encounter for other administrative examinations: Secondary | ICD-10-CM

## 2011-10-29 ENCOUNTER — Telehealth: Payer: Self-pay | Admitting: Internal Medicine

## 2011-10-29 NOTE — Telephone Encounter (Signed)
Pt. Hoping to get CPE completed in Dec, 2013. There are no regular openings. Do you want to add this pt into a regular OV slot?

## 2011-10-29 NOTE — Telephone Encounter (Signed)
Okay to fit him in somewhere ?Friday afternoon sometime?

## 2011-10-29 NOTE — Telephone Encounter (Signed)
I spoke with patient and scheduled appointment on 01/24/12.

## 2011-11-11 ENCOUNTER — Ambulatory Visit (INDEPENDENT_AMBULATORY_CARE_PROVIDER_SITE_OTHER): Payer: Medicare Other | Admitting: Internal Medicine

## 2011-11-11 ENCOUNTER — Encounter: Payer: Self-pay | Admitting: Internal Medicine

## 2011-11-11 VITALS — BP 150/80 | HR 73 | Temp 97.9°F | Wt 190.0 lb

## 2011-11-11 DIAGNOSIS — J019 Acute sinusitis, unspecified: Secondary | ICD-10-CM

## 2011-11-11 MED ORDER — FLUTICASONE PROPIONATE 50 MCG/ACT NA SUSP: 2.0000 | Freq: Every day | NASAL | Status: DC

## 2011-11-11 MED ORDER — AMOXICILLIN 500 MG PO TABS: 1000.0000 mg | ORAL_TABLET | Freq: Two times a day (BID) | ORAL | Status: DC

## 2011-11-11 NOTE — Assessment & Plan Note (Signed)
Clear sinus pressure and severe pain ?viral vs bacterial He has started left over amoxil and feels it helped Will Rx to finish treatment Discussed possible viral etiology Continue supportive Rx

## 2011-11-11 NOTE — Progress Notes (Signed)
Subjective:    Patient ID: Darren Atkins, male    DOB: April 05, 1940, 71 y.o.   MRN: [id number]  HPI Has been sick for 3 days Has had to sleep sitting up in chair Has swelling and pain in left cheek--throbbing sensation Same as previous sinus infection Took some left over amoxicillin ---still not better though  No cough Mild post nasal drip Has been using saline nasal spray and fluticasone No sore throat No ear pain  Some help also from advil cold and sinus  Current Outpatient Prescriptions on File Prior to Visit  Medication Sig Dispense Refill  . amLODipine (NORVASC) 5 MG tablet TAKE ONE TABLET BY MOUTH EVERY DAY  30 tablet  11  . cyanocobalamin (,VITAMIN B-12,) 1000 MCG/ML injection INJECT ONE ML IM ONCE A MONTH  10 mL  0  . docusate sodium (COLACE) 100 MG capsule Take 100 mg by mouth at bedtime as needed.        . fluticasone (FLONASE) 50 MCG/ACT nasal spray USE TWO SPRAYS IN EACH NOSTRIL EVERY DAY  16 g  10  . folic acid (FOLVITE) 1 MG tablet TAKE ONE TABLET BY MOUTH EVERY DAY  30 tablet  11  . Multiple Vitamin (MULTIVITAMIN) capsule Take 1 capsule by mouth daily.          Allergies  Allergen Reactions  . Hydrochlorothiazide W-Triamterene     REACTION: Palps  . Naprosyn (Naproxen) Other (See Comments)    Insomnia, stomach upset  . Sulfonamide Derivatives     REACTION: unspecified    Past Medical History  Diagnosis Date  . Allergy   . Hypertension   . Anemia   . Osteoarthritis     Past Surgical History  Procedure Date  . Nm myoview ltd     Aden. Myoview- apical thinning, no ischemia EF 53% 02/08    Family History  Problem Relation Age of Onset  . Hypertension Mother   . Osteoporosis Mother   . Hypertension Father   . Coronary artery disease Neg Hx   . Diabetes Neg Hx   . Cancer Neg Hx     History   Social History  . Marital Status: Married    Spouse Name: N/A    Number of Children: 1  . Years of Education: N/A   Occupational History  .  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 Smoking status: Never Smoker   . Smokeless tobacco: Former Neurosurgeon    Types: Chew    Quit date: 02/05/1983  . Alcohol Use: No  . Drug Use: Not on file  . Sexually Active: Not on file   Other Topics Concern  . Not on file   Social History Narrative   Does pushups and situps every day. Walks a lot at work   Review of Systems No rash No nausea or vomiting  Appetite is still okay     Objective:   Physical Exam  Constitutional: He appears well-developed and well-nourished. No distress.  HENT:  Mouth/Throat: Oropharynx is clear and moist. No oropharyngeal exudate.       Left maxillary tenderness Moderate nasal swelling and some inflammation TMs normal  Neck: Normal range of motion. Neck supple.  Pulmonary/Chest: Effort normal and breath sounds normal. No respiratory distress. He has no wheezes. He has no rales.  Lymphadenopathy:    He has no cervical adenopathy.  Assessment & Plan:

## 2012-01-24 ENCOUNTER — Encounter: Payer: Self-pay | Admitting: Internal Medicine

## 2012-01-24 ENCOUNTER — Encounter: Payer: Medicare Other | Admitting: Internal Medicine

## 2012-01-24 ENCOUNTER — Ambulatory Visit (INDEPENDENT_AMBULATORY_CARE_PROVIDER_SITE_OTHER): Payer: Medicare Other | Admitting: Internal Medicine

## 2012-01-24 VITALS — BP 152/82 | HR 69 | Temp 98.2°F | Wt 190.0 lb

## 2012-01-24 DIAGNOSIS — J309 Allergic rhinitis, unspecified: Secondary | ICD-10-CM

## 2012-01-24 DIAGNOSIS — Z0000 Encounter for general adult medical examination without abnormal findings: Secondary | ICD-10-CM

## 2012-01-24 DIAGNOSIS — I10 Essential (primary) hypertension: Secondary | ICD-10-CM

## 2012-01-24 LAB — CBC WITH DIFFERENTIAL/PLATELET
Basophils Absolute: 0 10*3/uL (ref 0.0–0.1)
Eosinophils Absolute: 0.3 10*3/uL (ref 0.0–0.7)
Eosinophils Relative: 5.5 % — ABNORMAL HIGH (ref 0.0–5.0)
HCT: 41.5 % (ref 39.0–52.0)
Lymphs Abs: 1.3 10*3/uL (ref 0.7–4.0)
MCHC: 34.3 g/dL (ref 30.0–36.0)
MCV: 89.1 fl (ref 78.0–100.0)
Monocytes Absolute: 0.6 10*3/uL (ref 0.1–1.0)
Neutrophils Relative %: 58.2 % (ref 43.0–77.0)
Platelets: 204 10*3/uL (ref 150.0–400.0)
RDW: 13.3 % (ref 11.5–14.6)
WBC: 5.3 10*3/uL (ref 4.5–10.5)

## 2012-01-24 LAB — BASIC METABOLIC PANEL
BUN: 25 mg/dL — ABNORMAL HIGH (ref 6–23)
Chloride: 106 mEq/L (ref 96–112)
Creatinine, Ser: 1.5 mg/dL (ref 0.4–1.5)
Glucose, Bld: 100 mg/dL — ABNORMAL HIGH (ref 70–99)
Potassium: 3.8 mEq/L (ref 3.5–5.1)

## 2012-01-24 LAB — LIPID PANEL
Cholesterol: 182 mg/dL (ref 0–200)
LDL Cholesterol: 128 mg/dL — ABNORMAL HIGH (ref 0–99)
Triglycerides: 110 mg/dL (ref 0.0–149.0)
VLDL: 22 mg/dL (ref 0.0–40.0)

## 2012-01-24 LAB — HEPATIC FUNCTION PANEL
AST: 21 U/L (ref 0–37)
Albumin: 4 g/dL (ref 3.5–5.2)
Total Bilirubin: 1.2 mg/dL (ref 0.3–1.2)

## 2012-01-24 LAB — TSH: TSH: 1.93 u[IU]/mL (ref 0.35–5.50)

## 2012-01-24 NOTE — Assessment & Plan Note (Signed)
Healthy Discussed fitness No PSA due to age UTD with everything else

## 2012-01-24 NOTE — Assessment & Plan Note (Signed)
Uses OTC meds as needed

## 2012-01-24 NOTE — Progress Notes (Signed)
Subjective:    Patient ID: Darren Atkins, male    DOB: [birth year], 71 y.o.   MRN: [id number]  HPI Here for physical No concerns Did get shingles vaccine  Wife now has DBS It has helped some but he still has extensive caregiving responsibilities She is still very deconditioned and has worked with therapist  Current Outpatient Prescriptions on File Prior to Visit  Medication Sig Dispense Refill  . amLODipine (NORVASC) 5 MG tablet TAKE ONE TABLET BY MOUTH EVERY DAY  30 tablet  11  . cyanocobalamin (,VITAMIN B-12,) 1000 MCG/ML injection INJECT ONE ML IM ONCE A MONTH  10 mL  0  . docusate sodium (COLACE) 100 MG capsule Take 100 mg by mouth at bedtime as needed.        . fluticasone (FLONASE) 50 MCG/ACT nasal spray Place 2 sprays into the nose daily.  16 g  11  . folic acid (FOLVITE) 1 MG tablet TAKE ONE TABLET BY MOUTH EVERY DAY  30 tablet  11  . Multiple Vitamin (MULTIVITAMIN) capsule Take 1 capsule by mouth daily.          Allergies  Allergen Reactions  . Hydrochlorothiazide W-Triamterene     REACTION: Palps  . Naprosyn (Naproxen) Other (See Comments)    Insomnia, stomach upset  . Sulfonamide Derivatives     REACTION: unspecified    Past Medical History  Diagnosis Date  . Allergy   . Hypertension   . Anemia   . Osteoarthritis     Past Surgical History  Procedure Date  . Nm myoview ltd     Aden. Myoview- apical thinning, no ischemia EF 53% 02/08    Family History  Problem Relation Age of Onset  . Hypertension Mother   . Osteoporosis Mother   . Hypertension Father   . Coronary artery disease Neg Hx   . Diabetes Neg Hx   . Cancer Neg Hx     History   Social History  . Marital Status: Married    Spouse Name: N/A    Number of Children: 1  . Years of Education: N/A   Occupational History  . 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 Smoking status: Never Smoker   . Smokeless  tobacco: Former Neurosurgeon    Types: Chew    Quit date: 02/05/1983  . Alcohol Use: No  . Drug Use: Not on file  . Sexually Active: Not on file   Other Topics Concern  . Not on file   Social History Narrative   No living willNo health care POA-- daughter would be the one.Would accept resuscitation attempts but no prolonged artificial ventilationProbably would accept tube feeds   Review of Systems  Constitutional: Negative for fatigue and unexpected weight change.       Wears seat belt  HENT: Positive for congestion and rhinorrhea. Negative for hearing loss, dental problem and tinnitus.        Full dentures Uses advil cold and sinus prn with good effect   Eyes: Negative for visual disturbance.       No diplopia or unilateral vision loss  Respiratory: Negative for cough, chest tightness and shortness of breath.   Cardiovascular: Negative for chest pain, palpitations and leg swelling.  Gastrointestinal: Positive for constipation. Negative for nausea, vomiting, abdominal pain and blood in stool.       No heartburn Uses sennakot prn for  bowels  Genitourinary: Negative for urgency, frequency and difficulty urinating.       No sexual problems  Skin: Negative for rash.       Some dry skin No suspicious lesions  Neurological: Positive for headaches. Negative for dizziness, syncope, weakness, light-headedness and numbness.       Occ vertigo---will use meclizine with good results Occ sinus headache  Hematological: Negative for adenopathy. Does not bruise/bleed easily.  Psychiatric/Behavioral: Negative for sleep disturbance and dysphoric mood. The patient is not nervous/anxious.        Objective:   Physical Exam  Constitutional: He is oriented to person, place, and time. He appears well-developed and well-nourished.  HENT:  Head: Normocephalic and atraumatic.  Right Ear: External ear normal.  Left Ear: External ear normal.  Mouth/Throat: Oropharynx is clear and moist. No oropharyngeal  exudate.  Eyes: Conjunctivae normal and EOM are normal. Pupils are equal, round, and reactive to light.  Neck: Normal range of motion. Neck supple. No thyromegaly present.  Cardiovascular: Normal rate, regular rhythm, normal heart sounds and intact distal pulses.  Exam reveals no gallop.   No murmur heard. Pulmonary/Chest: Effort normal and breath sounds normal. No respiratory distress. He has no wheezes. He has no rales.  Abdominal: Soft. There is no tenderness.  Musculoskeletal: He exhibits no edema and no tenderness.  Lymphadenopathy:    He has no cervical adenopathy.  Neurological: He is alert and oriented to person, place, and time.  Skin: No rash noted. No erythema.  Psychiatric: He has a normal mood and affect. His behavior is normal.          Assessment & Plan:

## 2012-01-24 NOTE — Assessment & Plan Note (Signed)
BP Readings from Last 3 Encounters:  01/24/12 152/82  11/11/11 150/80  04/01/11 144/74   Reasonable control at his age  No changes Will check labs including lipid---he is not sure about primary prevention

## 2012-01-27 ENCOUNTER — Encounter: Payer: Self-pay | Admitting: *Deleted

## 2012-02-21 ENCOUNTER — Other Ambulatory Visit: Payer: Self-pay | Admitting: Internal Medicine

## 2012-05-06 ENCOUNTER — Other Ambulatory Visit: Payer: Self-pay | Admitting: Internal Medicine

## 2012-05-07 NOTE — Telephone Encounter (Signed)
Rx sent to pharmacy by escript  

## 2012-11-09 ENCOUNTER — Other Ambulatory Visit: Payer: Self-pay | Admitting: Internal Medicine

## 2012-12-23 ENCOUNTER — Other Ambulatory Visit: Payer: Self-pay | Admitting: Internal Medicine

## 2013-01-26 ENCOUNTER — Encounter: Payer: Self-pay | Admitting: *Deleted

## 2013-01-26 ENCOUNTER — Ambulatory Visit (INDEPENDENT_AMBULATORY_CARE_PROVIDER_SITE_OTHER): Payer: Medicare Other | Admitting: Internal Medicine

## 2013-01-26 ENCOUNTER — Encounter: Payer: Self-pay | Admitting: Internal Medicine

## 2013-01-26 VITALS — BP 148/84 | HR 78 | Temp 98.7°F | Ht 72.0 in | Wt 199.5 lb

## 2013-01-26 DIAGNOSIS — D510 Vitamin B12 deficiency anemia due to intrinsic factor deficiency: Secondary | ICD-10-CM

## 2013-01-26 DIAGNOSIS — I10 Essential (primary) hypertension: Secondary | ICD-10-CM

## 2013-01-26 DIAGNOSIS — Z0000 Encounter for general adult medical examination without abnormal findings: Secondary | ICD-10-CM

## 2013-01-26 LAB — BASIC METABOLIC PANEL
CO2: 27 mEq/L (ref 19–32)
Calcium: 8.9 mg/dL (ref 8.4–10.5)
Glucose, Bld: 100 mg/dL — ABNORMAL HIGH (ref 70–99)
Potassium: 4.6 mEq/L (ref 3.5–5.1)
Sodium: 139 mEq/L (ref 135–145)

## 2013-01-26 LAB — CBC WITH DIFFERENTIAL/PLATELET
Basophils Absolute: 0 10*3/uL (ref 0.0–0.1)
Eosinophils Absolute: 0.3 10*3/uL (ref 0.0–0.7)
HCT: 43.3 % (ref 39.0–52.0)
Hemoglobin: 14.7 g/dL (ref 13.0–17.0)
Lymphs Abs: 1.6 10*3/uL (ref 0.7–4.0)
MCHC: 34 g/dL (ref 30.0–36.0)
Monocytes Absolute: 0.7 10*3/uL (ref 0.1–1.0)
Neutro Abs: 4.3 10*3/uL (ref 1.4–7.7)
RDW: 13.3 % (ref 11.5–14.6)

## 2013-01-26 LAB — HEPATIC FUNCTION PANEL
Albumin: 4.2 g/dL (ref 3.5–5.2)
Total Protein: 7.3 g/dL (ref 6.0–8.3)

## 2013-01-26 LAB — VITAMIN B12: Vitamin B-12: 508 pg/mL (ref 211–911)

## 2013-01-26 MED ORDER — VARDENAFIL HCL 20 MG PO TABS: 20.0000 mg | ORAL_TABLET | Freq: Every day | ORAL | Status: DC | PRN

## 2013-01-26 MED ORDER — FOLIC ACID 1 MG PO TABS: 1.0000 mg | ORAL_TABLET | Freq: Every day | ORAL | Status: DC

## 2013-01-26 MED ORDER — CYANOCOBALAMIN 1000 MCG/ML IJ SOLN: 1000.0000 ug | INTRAMUSCULAR | Status: DC

## 2013-01-26 MED ORDER — AMLODIPINE BESYLATE 5 MG PO TABS: 5.0000 mg | ORAL_TABLET | Freq: Every day | ORAL | Status: DC

## 2013-01-26 NOTE — Progress Notes (Signed)
Subjective:    Patient ID: Darren Atkins, male    DOB: 1940-11-14, 72 y.o.   MRN: [id number]  HPI Here for physical Doing well Reviewed advanced directives Tries to exercise but weight slowly increasing  Still has caregiving responsibilities for wife with dystonia Her DBS is helping and she is able to do more  No chest pain No SOB No change in exercise tolerance No edema No dizziness or syncope  Some arthritis pain in thumbs No progression Doesn't need meds Still gives himself B12 shots No sensory changes--but rarely will have burning itch on bottom of feet at night. Uses OTC cream and it helps  Current Outpatient Prescriptions on File Prior to Visit  Medication Sig Dispense Refill  . amLODipine (NORVASC) 5 MG tablet TAKE ONE TABLET BY MOUTH EVERY DAY  30 tablet  10  . cyanocobalamin (,VITAMIN B-12,) 1000 MCG/ML injection INJECT 1 ML INTRAMUSCULARLY ONCE A MONTH  10 mL  0  . docusate sodium (COLACE) 100 MG capsule Take 100 mg by mouth at bedtime as needed.        . fluticasone (FLONASE) 50 MCG/ACT nasal spray USE TWO SPRAYS IN EACH NOSTRIL EVERY DAY  16 g  0  . folic acid (FOLVITE) 1 MG tablet TAKE ONE TABLET BY MOUTH EVERY DAY  30 tablet  10  . Multiple Vitamin (MULTIVITAMIN) capsule Take 1 capsule by mouth daily.         No current facility-administered medications on file prior to visit.    Allergies  Allergen Reactions  . Hydrochlorothiazide W-Triamterene     REACTION: Palps  . Naprosyn [Naproxen] Other (See Comments)    Insomnia, stomach upset  . Sulfonamide Derivatives     REACTION: unspecified    Past Medical History  Diagnosis Date  . Allergy   . Hypertension   . Anemia   . Osteoarthritis     Past Surgical History  Procedure Laterality Date  . Nm myoview ltd      Aden. Myoview- apical thinning, no ischemia EF 53% 02/08    Family History  Problem Relation Age of Onset  . Hypertension Mother   . Osteoporosis Mother   . Hypertension Father   .  Coronary artery disease Neg Hx   . Diabetes Neg Hx   . Cancer Neg Hx     History   Social History  . Marital Status: Married    Spouse Name: N/A    Number of Children: 1  . Years of Education: N/A   Occupational History  . 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 Smoking status: Never Smoker   . Smokeless tobacco: Former Neurosurgeon    Types: Chew    Quit date: 02/05/1983  . Alcohol Use: No  . Drug Use: Not on file  . Sexual Activity: Not on file   Other Topics Concern  . Not on file   Social History Narrative   No living will   No health care POA-- daughter would be the one.   Would accept resuscitation attempts but no prolonged artificial ventilation   Probably would accept tube feeds             Review of Systems  Constitutional: Positive for unexpected weight change. Negative for fatigue.       Weight slowly increasing Wears seat belt  HENT: Negative for hearing loss and tinnitus.  Has full dentures---has extra growth that bothers the lower plate  Eyes: Negative for visual disturbance.       No diplopia or unilateral vision loss  Respiratory: Negative for cough, chest tightness and shortness of breath.   Cardiovascular: Negative for chest pain, palpitations and leg swelling.  Gastrointestinal: Negative for nausea, vomiting, abdominal pain, constipation and blood in stool.       No heartburn  Endocrine: Negative for cold intolerance and heat intolerance.  Genitourinary: Negative for urgency, frequency and difficulty urinating.       Some ED--may be able to restart relationship and may need the levitra  Musculoskeletal: Positive for arthralgias. Negative for back pain and joint swelling.       Mild arthritis in thumbs  Skin: Negative for rash.       No suspicious lesions  Allergic/Immunologic: Positive for environmental allergies. Negative for immunocompromised state.        Uses flonase and advil cold and sinus  Neurological: Positive for headaches. Negative for dizziness, syncope, weakness and light-headedness.       Occasional sinus headaches  Psychiatric/Behavioral: Negative for sleep disturbance and dysphoric mood. The patient is not nervous/anxious.          Objective:   Physical Exam  Constitutional: He is oriented to person, place, and time. He appears well-developed and well-nourished. No distress.  HENT:  Head: Normocephalic and atraumatic.  Right Ear: External ear normal.  Left Ear: External ear normal.  Mouth/Throat: Oropharynx is clear and moist. No oropharyngeal exudate.  Eyes: Conjunctivae and EOM are normal. Pupils are equal, round, and reactive to light.  Neck: Normal range of motion. Neck supple. No thyromegaly present.  Cardiovascular: Normal rate, regular rhythm, normal heart sounds and intact distal pulses.  Exam reveals no gallop.   No murmur heard. Pulmonary/Chest: Effort normal and breath sounds normal. No respiratory distress. He has no wheezes. He has no rales.  Abdominal: Soft. There is no tenderness.  Musculoskeletal: He exhibits no edema and no tenderness.  Lymphadenopathy:    He has no cervical adenopathy.  Neurological: He is alert and oriented to person, place, and time.  Skin: No rash noted. No erythema.  Psychiatric: He has a normal mood and affect. His behavior is normal.          Assessment & Plan:

## 2013-01-26 NOTE — Addendum Note (Signed)
Addended by: Sueanne Margarita on: 01/26/2013 09:15 AM   Modules accepted: Orders, Medications

## 2013-01-26 NOTE — Assessment & Plan Note (Signed)
Healthy UTD on all preventative things Counseling done

## 2013-01-26 NOTE — Assessment & Plan Note (Signed)
BP Readings from Last 3 Encounters:  01/26/13 148/84  01/24/12 152/82  11/11/11 150/80   Okay for age

## 2013-01-26 NOTE — Progress Notes (Signed)
Pre-visit discussion using our clinic review tool. No additional management support is needed unless otherwise documented below in the visit note.  

## 2013-01-26 NOTE — Assessment & Plan Note (Signed)
On supplements 

## 2013-02-15 ENCOUNTER — Other Ambulatory Visit: Payer: Self-pay | Admitting: Internal Medicine

## 2013-04-02 ENCOUNTER — Encounter: Payer: Self-pay | Admitting: Internal Medicine

## 2013-04-02 ENCOUNTER — Ambulatory Visit (INDEPENDENT_AMBULATORY_CARE_PROVIDER_SITE_OTHER): Payer: Medicare Other | Admitting: Internal Medicine

## 2013-04-02 VITALS — BP 138/84 | HR 74 | Temp 98.2°F | Wt 194.8 lb

## 2013-04-02 DIAGNOSIS — J329 Chronic sinusitis, unspecified: Secondary | ICD-10-CM

## 2013-04-02 MED ORDER — AMOXICILLIN 500 MG PO TABS: 500.0000 mg | ORAL_TABLET | Freq: Three times a day (TID) | ORAL | Status: DC

## 2013-04-02 NOTE — Progress Notes (Signed)
Pre visit review using our clinic review tool, if applicable. No additional management support is needed unless otherwise documented below in the visit note. 

## 2013-04-02 NOTE — Progress Notes (Signed)
HPI  Pt presents to the clinic today with c/o facial pain and pressure. This started about 3 days ago. He denies any colored mucous. He has been taking Advil cold and sinus without much relief. He is prone to sinus infections. He does have a history of allergies. He has not had sick contacts that he is aware of.  Review of Systems    Past Medical History  Diagnosis Date  . Allergy   . Hypertension   . Anemia   . Osteoarthritis     Family History  Problem Relation Age of Onset  . Hypertension Mother   . Osteoporosis Mother   . Hypertension Father   . Coronary artery disease Neg Hx   . Diabetes Neg Hx   . Cancer Neg Hx     History   Social History  . Marital Status: Married    Spouse Name: N/A    Number of Children: 1  . Years of Education: N/A   Occupational History  . Retired---- [employer] for [employer]     works part time helping with cows and goats   Social History Main Topics  . Smoking status: Never Smoker   . Smokeless tobacco: Former Systems developer    Types: [place] date: 02/05/1983  . Alcohol Use: No  . Drug Use: Not on file  . Sexual Activity: Not on file   Other Topics Concern  . Not on file   Social History Narrative   No living will   No health care POA-- daughter would be the one.   Would accept resuscitation attempts but no prolonged artificial ventilation   Probably would accept tube feeds             Allergies  Allergen Reactions  . Hydrochlorothiazide W-Triamterene     REACTION: Palps  . Naprosyn [Naproxen] Other (See Comments)    Insomnia, stomach upset  . Sulfonamide Derivatives     REACTION: unspecified     Constitutional: Positive headache, fatigue and fever. Denies abrupt weight changes.  HEENT:  Positive eye pain, pressure behind the eyes, facial pain, nasal congestion and sore throat. Denies eye redness, ear pain, ringing in the ears, wax buildup, runny nose or bloody nose. Respiratory: Positive cough. Denies difficulty  breathing or shortness of breath.  Cardiovascular: Denies chest pain, chest tightness, palpitations or swelling in the hands or feet.   No other specific complaints in a complete review of systems (except as listed in HPI above).  Objective:   BP 138/84  Pulse 74  Temp(Src) 98.2 F (36.8 C) (Oral)  Wt 194 lb 12 oz (88.338 kg)  SpO2 98%  General: Appears his stated age, well developed, well nourished in NAD. HEENT: Head: normal shape and size, left maxillary sinus tenderness noted; Eyes: sclera white, no icterus, conjunctiva pink, PERRLA and EOMs intact; Ears: Tm's gray and intact, normal light reflex; Nose: mucosa pink and moist, septum midline; Throat/Mouth: + PND. Teeth present, mucosa pink and moist, no exudate noted, no lesions or ulcerations noted.  Neck: Neck supple, trachea midline. No massses, lumps or thyromegaly present.  Cardiovascular: Normal rate and rhythm. S1,S2 noted.  No murmur, rubs or gallops noted. No JVD or BLE edema. No carotid bruits noted. Pulmonary/Chest: Normal effort and positive vesicular breath sounds. No respiratory distress. No wheezes, rales or ronchi noted.      Assessment & Plan:   Acute sinusitis, viral versus bacterial  Can use a Neti Pot which can be purchased  from your local drug store. Flonase 2 sprays each nostril for 3 days and then as needed. Amoxil BID for 10 days  RTC as needed or if symptoms persist.

## 2013-04-02 NOTE — Patient Instructions (Addendum)

## 2013-04-02 NOTE — Addendum Note (Signed)
Addended by: Lurlean Nanny on: 04/02/2013 05:05 PM   Modules accepted: Orders

## 2013-05-11 ENCOUNTER — Other Ambulatory Visit: Payer: Self-pay | Admitting: Internal Medicine

## 2013-06-08 ENCOUNTER — Encounter: Payer: Self-pay | Admitting: Gastroenterology

## 2013-11-25 ENCOUNTER — Encounter: Payer: Self-pay | Admitting: Family Medicine

## 2013-11-25 ENCOUNTER — Ambulatory Visit (INDEPENDENT_AMBULATORY_CARE_PROVIDER_SITE_OTHER): Payer: Medicare Other | Admitting: Family Medicine

## 2013-11-25 VITALS — BP 150/88 | HR 78 | Temp 98.4°F | Ht 72.0 in | Wt 199.0 lb

## 2013-11-25 DIAGNOSIS — J019 Acute sinusitis, unspecified: Secondary | ICD-10-CM

## 2013-11-25 DIAGNOSIS — B9689 Other specified bacterial agents as the cause of diseases classified elsewhere: Secondary | ICD-10-CM | POA: Insufficient documentation

## 2013-11-25 MED ORDER — AMOXICILLIN 500 MG PO CAPS: 1000.0000 mg | ORAL_CAPSULE | Freq: Two times a day (BID) | ORAL | Status: DC

## 2013-11-25 NOTE — Progress Notes (Signed)
Pre visit review using our clinic review tool, if applicable. No additional management support is needed unless otherwise documented below in the visit note. 

## 2013-11-25 NOTE — Assessment & Plan Note (Addendum)
Treat with antibiotics because of history of recurrent sinus infection, age and > 7 days symptoms. Nasal steroid, saline. Stop decongestant because of HTN. Try mucinex DM instead.

## 2013-11-25 NOTE — Patient Instructions (Signed)
Treat with nasal saline spray. Continue flonase. Complete course of amoxicillin x 10 days. Can try mucinex DM instead of advil cold and sinus.

## 2013-11-25 NOTE — Progress Notes (Signed)
   Subjective:    Patient ID: Darren Atkins, male    DOB: 19-May-1940, 73 y.o.   MRN: [id number]  HPI Comments: Chronic issues with sinuses.  Sinusitis This is a new problem. The current episode started in the past 7 days. The problem has been gradually worsening since onset. There has been no fever. The pain is moderate. Associated symptoms include congestion, headaches, sinus pressure and a sore throat. Pertinent negatives include no coughing, ear pain, hoarse voice, neck pain, shortness of breath, sneezing or swollen glands. Past treatments include antibiotics (advil cold and sinus, 4 tabs of amox  ( 2 on sat and 2 on sunday), flonase). The treatment provided moderate relief.   BP Readings from Last 3 Encounters:  11/25/13 150/88  04/02/13 138/84  01/26/13 148/84  Nml BP for him.    Review of Systems  HENT: Positive for congestion, sinus pressure and sore throat. Negative for ear pain, hoarse voice and sneezing.   Respiratory: Negative for cough and shortness of breath.   Musculoskeletal: Negative for neck pain.  Neurological: Positive for headaches.       Objective:   Physical Exam  Constitutional: Vital signs are normal. He appears well-developed and well-nourished.  Non-toxic appearance. He does not appear ill. No distress.  HENT:  Head: Normocephalic and atraumatic.  Right Ear: Hearing, tympanic membrane, external ear and ear canal normal. No tenderness. No foreign bodies. Tympanic membrane is not retracted and not bulging.  Left Ear: Hearing, tympanic membrane, external ear and ear canal normal. No tenderness. No foreign bodies. Tympanic membrane is not retracted and not bulging.  Nose: Mucosal edema present. No rhinorrhea. Right sinus exhibits maxillary sinus tenderness. Right sinus exhibits no frontal sinus tenderness. Left sinus exhibits maxillary sinus tenderness. Left sinus exhibits no frontal sinus tenderness.  Mouth/Throat: Uvula is midline, oropharynx is clear and  moist and mucous membranes are normal. Normal dentition. No dental caries. No oropharyngeal exudate or tonsillar abscesses.  Eyes: Conjunctivae, EOM and lids are normal. Pupils are equal, round, and reactive to light. Lids are everted and swept, no foreign bodies found.  Neck: Trachea normal, normal range of motion and phonation normal. Neck supple. Carotid bruit is not present. No mass and no thyromegaly present.  Cardiovascular: Normal rate, regular rhythm, S1 normal, S2 normal, normal heart sounds, intact distal pulses and normal pulses.  Exam reveals no gallop.   No murmur heard. Pulmonary/Chest: Effort normal and breath sounds normal. No respiratory distress. He has no wheezes. He has no rhonchi. He has no rales.  Abdominal: Soft. Normal appearance and bowel sounds are normal. There is no hepatosplenomegaly. There is no tenderness. There is no rebound, no guarding and no CVA tenderness. No hernia.  Neurological: He is alert. He has normal reflexes.  Skin: Skin is warm, dry and intact. No rash noted.  Psychiatric: He has a normal mood and affect. His speech is normal and behavior is normal. Judgment normal.          Assessment & Plan:

## 2014-02-01 ENCOUNTER — Telehealth: Payer: Self-pay | Admitting: Internal Medicine

## 2014-02-01 ENCOUNTER — Encounter: Payer: Self-pay | Admitting: Internal Medicine

## 2014-02-01 ENCOUNTER — Ambulatory Visit (INDEPENDENT_AMBULATORY_CARE_PROVIDER_SITE_OTHER): Payer: Medicare Other | Admitting: Internal Medicine

## 2014-02-01 VITALS — BP 162/100 | HR 82 | Temp 98.1°F | Ht 72.0 in | Wt 202.0 lb

## 2014-02-01 DIAGNOSIS — Z23 Encounter for immunization: Secondary | ICD-10-CM

## 2014-02-01 DIAGNOSIS — D510 Vitamin B12 deficiency anemia due to intrinsic factor deficiency: Secondary | ICD-10-CM

## 2014-02-01 DIAGNOSIS — Z0000 Encounter for general adult medical examination without abnormal findings: Secondary | ICD-10-CM

## 2014-02-01 DIAGNOSIS — I10 Essential (primary) hypertension: Secondary | ICD-10-CM

## 2014-02-01 DIAGNOSIS — Z7189 Other specified counseling: Secondary | ICD-10-CM

## 2014-02-01 DIAGNOSIS — J301 Allergic rhinitis due to pollen: Secondary | ICD-10-CM

## 2014-02-01 LAB — COMPREHENSIVE METABOLIC PANEL
ALK PHOS: 63 U/L (ref 39–117)
ALT: 30 U/L (ref 0–53)
AST: 23 U/L (ref 0–37)
Albumin: 4 g/dL (ref 3.5–5.2)
BILIRUBIN TOTAL: 1.3 mg/dL — AB (ref 0.2–1.2)
BUN: 23 mg/dL (ref 6–23)
CO2: 27 mEq/L (ref 19–32)
Calcium: 8.7 mg/dL (ref 8.4–10.5)
Chloride: 105 mEq/L (ref 96–112)
Creatinine, Ser: 1.8 mg/dL — ABNORMAL HIGH (ref 0.4–1.5)
GFR: 40.27 mL/min — ABNORMAL LOW (ref >60.00)
GLUCOSE: 94 mg/dL (ref 70–99)
Potassium: 4.3 mEq/L (ref 3.5–5.1)
SODIUM: 139 meq/L (ref 135–145)
TOTAL PROTEIN: 7.4 g/dL (ref 6.0–8.3)

## 2014-02-01 LAB — CBC WITH DIFFERENTIAL/PLATELET
BASOS PCT: 0.7 % (ref 0.0–3.0)
Basophils Absolute: 0 10*3/uL (ref 0.0–0.1)
EOS ABS: 0.3 10*3/uL (ref 0.0–0.7)
Eosinophils Relative: 4.4 % (ref 0.0–5.0)
HCT: 43.6 % (ref 39.0–52.0)
HEMOGLOBIN: 14.5 g/dL (ref 13.0–17.0)
LYMPHS ABS: 1.5 10*3/uL (ref 0.7–4.0)
Lymphocytes Relative: 22.4 % (ref 12.0–46.0)
MCHC: 33.2 g/dL (ref 30.0–36.0)
MCV: 90.7 fl (ref 78.0–100.0)
Monocytes Absolute: 0.7 10*3/uL (ref 0.1–1.0)
Monocytes Relative: 11.2 % (ref 3.0–12.0)
NEUTROS ABS: 4 10*3/uL (ref 1.4–7.7)
Neutrophils Relative %: 61.3 % (ref 43.0–77.0)
Platelets: 208 10*3/uL (ref 150.0–400.0)
RBC: 4.81 Mil/uL (ref 4.22–5.81)
RDW: 13.5 % (ref 11.5–15.5)
WBC: 6.5 10*3/uL (ref 4.0–10.5)

## 2014-02-01 LAB — VITAMIN B12: VITAMIN B 12: 558 pg/mL (ref 211–911)

## 2014-02-01 LAB — T4, FREE: Free T4: 0.74 ng/dL (ref 0.60–1.60)

## 2014-02-01 MED ORDER — FOLIC ACID 1 MG PO TABS: 1.0000 mg | ORAL_TABLET | Freq: Every day | ORAL | Status: DC

## 2014-02-01 MED ORDER — CYANOCOBALAMIN 1000 MCG/ML IJ SOLN: 1000.0000 ug | INTRAMUSCULAR | Status: DC

## 2014-02-01 MED ORDER — AMLODIPINE BESYLATE 10 MG PO TABS: 10.0000 mg | ORAL_TABLET | Freq: Every day | ORAL | Status: DC

## 2014-02-01 NOTE — Assessment & Plan Note (Signed)
Persists but satisfied with his regimen

## 2014-02-01 NOTE — Progress Notes (Signed)
Subjective:    Patient ID: Darren Atkins, male    DOB: 20-Oct-1940, 73 y.o.   MRN: [id number]  HPI Here for Medicare wellness and follow up chronic medical problems Reviewed form and advanced directives No falls  No depression or anhedonia Reviewed other doctors---only eye doctor [place] No tobacco or alcohol Independent with all instrumental ADLs Hearing and vision are fine No cognitive issues of concern  Has been inconsistent taking his BP med Concentrates on giving his wife all her meds and forgets his Missing half his doses often No headaches other than sinus No chest pain or SOB No change in exercise tolerance--but not as good stamina as in the past  Ongoing allergy symptoms 2 sinus infections in past year Satisfied with his Rx  Still takes B12 shots No sensory changes No fatigue  Noticed less effect with the levitra Not as effective but does use it at times  Current Outpatient Prescriptions on File Prior to Visit  Medication Sig Dispense Refill  . amLODipine (NORVASC) 5 MG tablet Take 1 tablet (5 mg total) by mouth daily. 90 tablet 3  . cyanocobalamin (,VITAMIN B-12,) 1000 MCG/ML injection Inject 1 mL (1,000 mcg total) into the muscle every 30 (thirty) days. 10 mL 1  . docusate sodium (COLACE) 100 MG capsule Take 100 mg by mouth at bedtime as needed.      . fluticasone (FLONASE) 50 MCG/ACT nasal spray USE TWO SPRAY(S) IN EACH NOSTRIL ONCE DAILY 16 g 11  . folic acid (FOLVITE) 1 MG tablet Take 1 tablet (1 mg total) by mouth daily. 90 tablet 3  . Multiple Vitamin (MULTIVITAMIN) capsule Take 1 capsule by mouth daily.      . vardenafil (LEVITRA) 20 MG tablet Take 1 tablet (20 mg total) by mouth daily as needed for erectile dysfunction. 10 tablet 5   No current facility-administered medications on file prior to visit.    Allergies  Allergen Reactions  . Hydrochlorothiazide W-Triamterene     REACTION: Palps  . Naprosyn [Naproxen] Other (See  Comments)    Insomnia, stomach upset  . Sulfonamide Derivatives     REACTION: unspecified    Past Medical History  Diagnosis Date  . Allergy   . Hypertension   . Anemia   . Osteoarthritis     Past Surgical History  Procedure Laterality Date  . Nm myoview ltd      [place]. Myoview- apical thinning, no ischemia EF 53% 02/08    Family History  Problem Relation Age of Onset  . Hypertension Mother   . Osteoporosis Mother   . Hypertension Father   . Coronary artery disease Neg Hx   . Diabetes Neg Hx   . Cancer Neg Hx     History   Social History  . Marital Status: Married    Spouse Name: N/A    Number of Children: 1  . Years of Education: N/A   Occupational History  . Retired---- [employer] for [employer]     works part time helping with cows and goats   Social History Main Topics  . Smoking status: Never Smoker   . Smokeless tobacco: Former Systems developer    Types: [place] date: 02/05/1983  . Alcohol Use: No  . Drug Use: Not on file  . Sexual Activity: Not on file   Other Topics Concern  . Not on file   Social History Narrative   No living will   No health  care POA-- daughter would be the one.   Would accept resuscitation attempts but no prolonged artificial ventilation   Probably would accept tube feeds            Review of Systems Sleeps well Appetite is good--weight slowly drifting up. Voids well. No nocturia Bowels are slow--- uses the colace with success. No blood. No arthritis problems No skin rashes--- has spot on left side of face that stays red though. Hasn't really changed much. Had zostavax [facility] year    Objective:   Physical Exam  Constitutional: He is oriented to person, place, and time. He appears well-developed and well-nourished. No distress.  HENT:  Mouth/Throat: Oropharynx is clear and moist. No oropharyngeal exudate.  Full dentures  Neck: Normal range of motion. Neck supple. No thyromegaly present.  Cardiovascular:  Normal rate, regular rhythm, normal heart sounds and intact distal pulses.  Exam reveals no gallop.   No murmur heard. Pulmonary/Chest: Effort normal and breath sounds normal. No respiratory distress. He has no wheezes. He has no rales.  Abdominal: Soft. There is no tenderness.  Musculoskeletal: He exhibits no edema or tenderness.  Lymphadenopathy:    He has no cervical adenopathy.  Neurological: He is alert and oriented to person, place, and time.  President-- "Ramonita Lab, Bush---then ?" [phone] [id number] Recall 3/3  Skin:  Slight scaling in right preauricular area--but no clear actinic (counseled to watch for progression)  Psychiatric: He has a normal mood and affect. His behavior is normal.          Assessment & Plan:

## 2014-02-01 NOTE — Telephone Encounter (Signed)
emmi mailed  °

## 2014-02-01 NOTE — Addendum Note (Signed)
Addended by: Despina Hidden on: 02/01/2014 10:14 AM   Modules accepted: Orders

## 2014-02-01 NOTE — Assessment & Plan Note (Signed)
On B12 shots Last >1 month ago--will check trough level

## 2014-02-01 NOTE — Assessment & Plan Note (Signed)
See social history Has blank forms 

## 2014-02-01 NOTE — Progress Notes (Signed)
Pre visit review using our clinic review tool, if applicable. No additional management support is needed unless otherwise documented below in the visit note. 

## 2014-02-01 NOTE — Assessment & Plan Note (Signed)
I have personally reviewed the Medicare Annual Wellness questionnaire and have noted 1. The patient's medical and social history 2. Their use of alcohol, tobacco or illicit drugs 3. Their current medications and supplements 4. The patient's functional ability including ADL's, fall risks, home safety risks and hearing or visual             impairment. 5. Diet and physical activities 6. Evidence for depression or mood disorders  The patients weight, height, BMI and visual acuity have been recorded in the chart I have made referrals, counseling and provided education to the patient based review of the above and I have provided the pt with a written personalized care plan for preventive services.  I have provided you with a copy of your personalized plan for preventive services. Please take the time to review along with your updated medication list.  prevnar today Cholesterol has been good-won't check Colonoscopy due 2020--will decide then No PSA due to age 73 fairly fit---needs to watch weight now though

## 2014-02-01 NOTE — Assessment & Plan Note (Signed)
BP Readings from Last 3 Encounters:  02/01/14 162/100  11/25/13 150/88  04/02/13 138/84   Has missed a lot of med in the past 2 months--but taken mostly in past week Will increase amlodipine He will monitor at home Recheck 2 months--consider adding diuretic

## 2014-02-02 ENCOUNTER — Encounter: Payer: Self-pay | Admitting: *Deleted

## 2014-04-04 ENCOUNTER — Ambulatory Visit (INDEPENDENT_AMBULATORY_CARE_PROVIDER_SITE_OTHER): Payer: PPO | Admitting: Internal Medicine

## 2014-04-04 ENCOUNTER — Encounter: Payer: Self-pay | Admitting: Internal Medicine

## 2014-04-04 VITALS — BP 152/80 | HR 72 | Temp 98.6°F | Wt 197.4 lb

## 2014-04-04 DIAGNOSIS — N183 Chronic kidney disease, stage 3 unspecified: Secondary | ICD-10-CM

## 2014-04-04 DIAGNOSIS — N1832 Chronic kidney disease, stage 3b: Secondary | ICD-10-CM | POA: Insufficient documentation

## 2014-04-04 DIAGNOSIS — N1831 Chronic kidney disease, stage 3a: Secondary | ICD-10-CM | POA: Insufficient documentation

## 2014-04-04 DIAGNOSIS — I10 Essential (primary) hypertension: Secondary | ICD-10-CM

## 2014-04-04 MED ORDER — LISINOPRIL 5 MG PO TABS: 5.0000 mg | ORAL_TABLET | Freq: Every day | ORAL | Status: DC

## 2014-04-04 NOTE — Assessment & Plan Note (Signed)
Stable for some time Will start low dose lisinopril

## 2014-04-04 NOTE — Progress Notes (Signed)
Pre visit review using our clinic review tool, if applicable. No additional management support is needed unless otherwise documented below in the visit note. 

## 2014-04-04 NOTE — Assessment & Plan Note (Signed)
BP Readings from Last 3 Encounters:  04/04/14 152/80  02/01/14 162/100  11/25/13 150/88   Better and actually low at home No symptoms though Repeat on right 154/92  Will add low dose lisinopril

## 2014-04-04 NOTE — Patient Instructions (Signed)
Please start the lisinopril 5mg  daily Set up blood work in about 1 month---renal profile (N18.3) She should avoid taking any over the counter anti inflammatories like advil or aleve

## 2014-04-04 NOTE — Progress Notes (Signed)
Subjective:    Patient ID: Darren Atkins, male    DOB: 1940-11-21, 74 y.o.   MRN: [id number]  HPI Here for follow up of HTN  No problems increasing amlodipine BPs at home 500/93--818/29 Most systolics in the 937'J No dizziness No edema No chest pain No SOB  Current Outpatient Prescriptions on File Prior to Visit  Medication Sig Dispense Refill  . amLODipine (NORVASC) 10 MG tablet Take 1 tablet (10 mg total) by mouth daily. 90 tablet 3  . cyanocobalamin (,VITAMIN B-12,) 1000 MCG/ML injection Inject 1 mL (1,000 mcg total) into the muscle every 30 (thirty) days. 3 mL 3  . docusate sodium (COLACE) 100 MG capsule Take 100 mg by mouth at bedtime as needed.      . fluticasone (FLONASE) 50 MCG/ACT nasal spray USE TWO SPRAY(S) IN EACH NOSTRIL ONCE DAILY 16 g 11  . folic acid (FOLVITE) 1 MG tablet Take 1 tablet (1 mg total) by mouth daily. 90 tablet 3  . Multiple Vitamin (MULTIVITAMIN) capsule Take 1 capsule by mouth daily.      . vardenafil (LEVITRA) 20 MG tablet Take 1 tablet (20 mg total) by mouth daily as needed for erectile dysfunction. 10 tablet 5   No current facility-administered medications on file prior to visit.    Allergies  Allergen Reactions  . Hydrochlorothiazide W-Triamterene     REACTION: Palps  . Naprosyn [Naproxen] Other (See Comments)    Insomnia, stomach upset  . Sulfonamide Derivatives     REACTION: unspecified    Past Medical History  Diagnosis Date  . Allergy   . Hypertension   . Anemia   . Osteoarthritis     Past Surgical History  Procedure Laterality Date  . Nm myoview ltd      [place]. Myoview- apical thinning, no ischemia EF 53% 02/08    Family History  Problem Relation Age of Onset  . Hypertension Mother   . Osteoporosis Mother   . Hypertension Father   . Coronary artery disease Neg Hx   . Diabetes Neg Hx   . Cancer Neg Hx     History   Social History  . Marital Status: Married    Spouse Name: N/A  . Number of Children: 1  . Years  of Education: N/A   Occupational History  . Retired---- [employer] for [employer] [place]     works part time helping with cows and goats   Social History Main Topics  . Smoking status: Never Smoker   . Smokeless tobacco: Former Systems developer    Types: [place] date: 02/05/1983  . Alcohol Use: No  . Drug Use: Not on file  . Sexual Activity: Not on file   Other Topics Concern  . Not on file   Social History Narrative   No living will   No health care POA-- daughter would be the one.   Would accept resuscitation attempts but no prolonged artificial ventilation   Probably would accept tube feeds            Review of Systems Appetite is good---but has cut out sweets and bread Weight down 5#    Objective:   Physical Exam  Constitutional: He appears well-developed and well-nourished. No distress.  Neck: Normal range of motion. Neck supple. No thyromegaly present.  Cardiovascular: Normal rate, regular rhythm and normal heart sounds.  Exam reveals no gallop.   No murmur heard. Pulmonary/Chest: Effort normal and breath sounds normal. No respiratory distress. He  has no wheezes. He has no rales.  Musculoskeletal: He exhibits no edema or tenderness.  Lymphadenopathy:    He has no cervical adenopathy.          Assessment & Plan:

## 2014-05-02 ENCOUNTER — Other Ambulatory Visit: Payer: Self-pay | Admitting: Internal Medicine

## 2014-05-02 DIAGNOSIS — N183 Chronic kidney disease, stage 3 unspecified: Secondary | ICD-10-CM

## 2014-05-03 ENCOUNTER — Encounter: Payer: Self-pay | Admitting: *Deleted

## 2014-05-03 ENCOUNTER — Other Ambulatory Visit (INDEPENDENT_AMBULATORY_CARE_PROVIDER_SITE_OTHER): Payer: PPO

## 2014-05-03 DIAGNOSIS — N183 Chronic kidney disease, stage 3 unspecified: Secondary | ICD-10-CM

## 2014-05-03 LAB — RENAL FUNCTION PANEL
Albumin: 3.9 g/dL (ref 3.5–5.2)
BUN: 25 mg/dL — AB (ref 6–23)
CALCIUM: 9.1 mg/dL (ref 8.4–10.5)
CHLORIDE: 105 meq/L (ref 96–112)
CO2: 28 meq/L (ref 19–32)
Creatinine, Ser: 1.67 mg/dL — ABNORMAL HIGH (ref 0.40–1.50)
GFR: 43.04 mL/min — ABNORMAL LOW (ref >60.00)
GLUCOSE: 92 mg/dL (ref 70–99)
Phosphorus: 3.2 mg/dL (ref 2.3–4.6)
Potassium: 4.6 mEq/L (ref 3.5–5.1)
Sodium: 137 mEq/L (ref 135–145)

## 2014-06-06 ENCOUNTER — Encounter: Payer: Self-pay | Admitting: Internal Medicine

## 2014-06-06 ENCOUNTER — Ambulatory Visit (INDEPENDENT_AMBULATORY_CARE_PROVIDER_SITE_OTHER): Payer: PPO | Admitting: Internal Medicine

## 2014-06-06 VITALS — BP 130/80 | HR 80 | Temp 98.3°F | Wt 198.0 lb

## 2014-06-06 DIAGNOSIS — J0100 Acute maxillary sinusitis, unspecified: Secondary | ICD-10-CM

## 2014-06-06 MED ORDER — AMOXICILLIN 500 MG PO TABS: 1000.0000 mg | ORAL_TABLET | Freq: Two times a day (BID) | ORAL | Status: DC

## 2014-06-06 NOTE — Progress Notes (Signed)
Subjective:    Patient ID: Darren Atkins, male    DOB: 1940-09-22, 74 y.o.   MRN: [id number]  HPI Here with wife due to sinus infection Has had sinus symptoms for over a week--- worsened this weekend and took a few extra amoxil with some improvement  Having left maxillary pressure Not too much congestion with flonase Some sore throat and nasal drainage Some soreness by left ear--may be just that whole side of his face No cough No SOB No fever  advil cold and sinus some help  Current Outpatient Prescriptions on File Prior to Visit  Medication Sig Dispense Refill  . amLODipine (NORVASC) 10 MG tablet Take 1 tablet (10 mg total) by mouth daily. 90 tablet 3  . cyanocobalamin (,VITAMIN B-12,) 1000 MCG/ML injection Inject 1 mL (1,000 mcg total) into the muscle every 30 (thirty) days. 3 mL 3  . docusate sodium (COLACE) 100 MG capsule Take 100 mg by mouth at bedtime as needed.      . fluticasone (FLONASE) 50 MCG/ACT nasal spray USE TWO SPRAY(S) IN EACH NOSTRIL ONCE DAILY 16 g 11  . folic acid (FOLVITE) 1 MG tablet Take 1 tablet (1 mg total) by mouth daily. 90 tablet 3  . lisinopril (PRINIVIL,ZESTRIL) 5 MG tablet Take 1 tablet (5 mg total) by mouth daily. 90 tablet 3  . Multiple Vitamin (MULTIVITAMIN) capsule Take 1 capsule by mouth daily.      . vardenafil (LEVITRA) 20 MG tablet Take 1 tablet (20 mg total) by mouth daily as needed for erectile dysfunction. 10 tablet 5   No current facility-administered medications on file prior to visit.    Allergies  Allergen Reactions  . Hydrochlorothiazide W-Triamterene     REACTION: Palps  . Naprosyn [Naproxen] Other (See Comments)    Insomnia, stomach upset  . Sulfonamide Derivatives     REACTION: unspecified    Past Medical History  Diagnosis Date  . Allergy   . Hypertension   . Anemia   . Osteoarthritis     Past Surgical History  Procedure Laterality Date  . Nm myoview ltd      [place]. Myoview- apical thinning, no ischemia EF 53%  02/08    Family History  Problem Relation Age of Onset  . Hypertension Mother   . Osteoporosis Mother   . Hypertension Father   . Coronary artery disease Neg Hx   . Diabetes Neg Hx   . Cancer Neg Hx     History   Social History  . Marital Status: Married    Spouse Name: N/A  . Number of Children: 1  . Years of Education: N/A   Occupational History  . Retired---- [employer] for [employer]     works part time helping with cows and goats   Social History Main Topics  . Smoking status: Never Smoker   . Smokeless tobacco: Former Systems developer    Types: [place] date: 02/05/1983  . Alcohol Use: No  . Drug Use: Not on file  . Sexual Activity: Not on file   Other Topics Concern  . Not on file   Social History Narrative   No living will   No health care POA-- daughter would be the one.   Would accept resuscitation attempts but no prolonged artificial ventilation   Probably would accept tube feeds            Review of Systems No rash No vomiting or diarrhea Appetite is okay  Objective:   Physical Exam  Constitutional: He appears well-developed and well-nourished. No distress.  HENT:  Mouth/Throat: Oropharynx is clear and moist. No oropharyngeal exudate.  Moderate nasal inflammation Left maxillary tenderness TMs normal  Neck: Normal range of motion. Neck supple.  Pulmonary/Chest: Effort normal and breath sounds normal. No respiratory distress. He has no wheezes. He has no rales.  Lymphadenopathy:    He has no cervical adenopathy.          Assessment & Plan:

## 2014-06-06 NOTE — Assessment & Plan Note (Signed)
Seems to have secondary bacterial infection Will treat with amoxil Discussed supportive care

## 2014-06-06 NOTE — Progress Notes (Signed)
Pre visit review using our clinic review tool, if applicable. No additional management support is needed unless otherwise documented below in the visit note. 

## 2014-06-14 ENCOUNTER — Other Ambulatory Visit: Payer: Self-pay | Admitting: *Deleted

## 2014-06-14 MED ORDER — CYANOCOBALAMIN 1000 MCG/ML IJ SOLN: 1000.0000 ug | INTRAMUSCULAR | Status: DC

## 2014-09-22 ENCOUNTER — Encounter: Payer: Self-pay | Admitting: Internal Medicine

## 2014-09-22 ENCOUNTER — Ambulatory Visit (INDEPENDENT_AMBULATORY_CARE_PROVIDER_SITE_OTHER): Payer: PPO | Admitting: Internal Medicine

## 2014-09-22 VITALS — BP 150/80 | HR 82 | Temp 98.4°F | Wt 194.0 lb

## 2014-09-22 DIAGNOSIS — L049 Acute lymphadenitis, unspecified: Secondary | ICD-10-CM | POA: Insufficient documentation

## 2014-09-22 MED ORDER — AMOXICILLIN 500 MG PO TABS: 1000.0000 mg | ORAL_TABLET | Freq: Two times a day (BID) | ORAL | Status: DC

## 2014-09-22 NOTE — Assessment & Plan Note (Signed)
Seems like a saliva gland Will treat with amoxil due to the tenderness and not feeling well (mild systemic symptoms)

## 2014-09-22 NOTE — Progress Notes (Signed)
Subjective:    Patient ID: Darren Atkins, male    DOB: 03/22/1940, 74 y.o.   MRN: [id number]  HPI Here for evaluation of sinus symptoms  Did get over previous infection  Started with some symptoms yesterday Sinus drainage and left maxillary soreness  Now feels tender spot under left chin  No cough, sore throat or ear pain No fever No SOB  Hasn't tried anything other than advil cold and sinus  Current Outpatient Prescriptions on File Prior to Visit  Medication Sig Dispense Refill  . amLODipine (NORVASC) 10 MG tablet Take 1 tablet (10 mg total) by mouth daily. 90 tablet 3  . cyanocobalamin (,VITAMIN B-12,) 1000 MCG/ML injection Inject 1 mL (1,000 mcg total) into the muscle every 30 (thirty) days. Dx:D51.0 3 mL 3  . docusate sodium (COLACE) 100 MG capsule Take 100 mg by mouth at bedtime as needed.      . fluticasone (FLONASE) 50 MCG/ACT nasal spray USE TWO SPRAY(S) IN EACH NOSTRIL ONCE DAILY 16 g 11  . folic acid (FOLVITE) 1 MG tablet Take 1 tablet (1 mg total) by mouth daily. 90 tablet 3  . lisinopril (PRINIVIL,ZESTRIL) 5 MG tablet Take 1 tablet (5 mg total) by mouth daily. 90 tablet 3  . Multiple Vitamin (MULTIVITAMIN) capsule Take 1 capsule by mouth daily.       No current facility-administered medications on file prior to visit.    Allergies  Allergen Reactions  . Hydrochlorothiazide W-Triamterene     REACTION: Palps  . Naprosyn [Naproxen] Other (See Comments)    Insomnia, stomach upset  . Sulfonamide Derivatives     REACTION: unspecified    Past Medical History  Diagnosis Date  . Allergy   . Hypertension   . Anemia   . Osteoarthritis     Past Surgical History  Procedure Laterality Date  . Nm myoview ltd      [place]. Myoview- apical thinning, no ischemia EF 53% 02/08    Family History  Problem Relation Age of Onset  . Hypertension Mother   . Osteoporosis Mother   . Hypertension Father   . Coronary artery disease Neg Hx   . Diabetes Neg Hx   . Cancer  Neg Hx     Social History   Social History  . Marital Status: Married    Spouse Name: N/A  . Number of Children: 1  . Years of Education: N/A   Occupational History  . Retired---- [employer] for [employer]     works part time helping with cows and goats   Social History Main Topics  . Smoking status: Never Smoker   . Smokeless tobacco: Former Systems developer    Types: [place] date: 02/05/1983  . Alcohol Use: No  . Drug Use: Not on file  . Sexual Activity: Not on file   Other Topics Concern  . Not on file   Social History Narrative   No living will   No health care POA-- daughter would be the one.   Would accept resuscitation attempts but no prolonged artificial ventilation   Probably would accept tube feeds             Review of Systems  No swallowing problems Appetite is okay      Objective:   Physical Exam  Constitutional: He appears well-developed and well-nourished. No distress.  HENT:  No sinus tenderness TMs normal Inflammation along left lower molars (of denture) No sig nasal inflammation  Neck:  Mildly tender gland under mid left mandible          Assessment & Plan:

## 2014-09-22 NOTE — Progress Notes (Signed)
Pre visit review using our clinic review tool, if applicable. No additional management support is needed unless otherwise documented below in the visit note. 

## 2014-12-16 ENCOUNTER — Other Ambulatory Visit: Payer: Self-pay | Admitting: Internal Medicine

## 2015-01-10 ENCOUNTER — Other Ambulatory Visit: Payer: Self-pay | Admitting: Internal Medicine

## 2015-01-10 NOTE — Telephone Encounter (Signed)
Find out what is going on I don't usually refill antibiotics

## 2015-01-10 NOTE — Telephone Encounter (Signed)
Spoke with patient and he thinks he may have a sinus infection, no temp, clear drainage, head stopped up and small cough. Please advise

## 2015-01-11 ENCOUNTER — Encounter: Payer: Self-pay | Admitting: Internal Medicine

## 2015-01-11 ENCOUNTER — Ambulatory Visit (INDEPENDENT_AMBULATORY_CARE_PROVIDER_SITE_OTHER): Payer: PPO | Admitting: Internal Medicine

## 2015-01-11 VITALS — BP 148/70 | HR 88 | Temp 97.7°F | Wt 200.0 lb

## 2015-01-11 DIAGNOSIS — J069 Acute upper respiratory infection, unspecified: Secondary | ICD-10-CM

## 2015-01-11 NOTE — Telephone Encounter (Signed)
Pt scheduled appt today at 12:15

## 2015-01-11 NOTE — Assessment & Plan Note (Signed)
Discussed apparent viral etiology Doesn't need antibiotic--though would try if he worsens next week Analgesics Suggested his nasal steroid as preventative--keeps ostia open

## 2015-01-11 NOTE — Progress Notes (Signed)
Pre visit review using our clinic review tool, if applicable. No additional management support is needed unless otherwise documented below in the visit note. 

## 2015-01-11 NOTE — Progress Notes (Signed)
Subjective:    Patient ID: Darren Atkins, male    DOB: 03-13-1940, 74 y.o.   MRN: [id number]  HPI Here due to sinus infection  Felt he was coming down with infection about 3-4 days ago Cough, rhinorrhea Started left over amoxicillin then Seemed to be worse yesterday--cough more hacky and more clear rhinorrhea, more sneezing  No fever No SOB No ear pain Slight sore throat from sinus drainage  Tried advil cold and sinus--may have helped some  Current Outpatient Prescriptions on File Prior to Visit  Medication Sig Dispense Refill  . amLODipine (NORVASC) 10 MG tablet Take 1 tablet (10 mg total) by mouth daily. 90 tablet 3  . cyanocobalamin (,VITAMIN B-12,) 1000 MCG/ML injection Inject 1 mL (1,000 mcg total) into the muscle every 30 (thirty) days. Dx:D51.0 3 mL 3  . docusate sodium (COLACE) 100 MG capsule Take 100 mg by mouth at bedtime as needed.      . fluticasone (FLONASE) 50 MCG/ACT nasal spray USE TWO SPRAY(S) IN EACH NOSTRIL ONCE DAILY 16 g 11  . folic acid (FOLVITE) 1 MG tablet Take 1 tablet (1 mg total) by mouth daily. 90 tablet 3  . lisinopril (PRINIVIL,ZESTRIL) 5 MG tablet Take 1 tablet (5 mg total) by mouth daily. 90 tablet 3  . Multiple Vitamin (MULTIVITAMIN) capsule Take 1 capsule by mouth daily.       No current facility-administered medications on file prior to visit.    Allergies  Allergen Reactions  . Hydrochlorothiazide W-Triamterene     REACTION: Palps  . Naprosyn [Naproxen] Other (See Comments)    Insomnia, stomach upset  . Sulfonamide Derivatives     REACTION: unspecified    Past Medical History  Diagnosis Date  . Allergy   . Hypertension   . Anemia   . Osteoarthritis     Past Surgical History  Procedure Laterality Date  . Nm myoview ltd      [place]. Myoview- apical thinning, no ischemia EF 53% 02/08    Family History  Problem Relation Age of Onset  . Hypertension Mother   . Osteoporosis Mother   . Hypertension Father   . Coronary artery  disease Neg Hx   . Diabetes Neg Hx   . Cancer Neg Hx     Social History   Social History  . Marital Status: Married    Spouse Name: N/A  . Number of Children: 1  . Years of Education: N/A   Occupational History  . Retired---- [employer] for [place]     works part time helping with cows and goats   Social History Main Topics  . Smoking status: Never Smoker   . Smokeless tobacco: Former Systems developer    Types: [place] date: 02/05/1983  . Alcohol Use: No  . Drug Use: Not on file  . Sexual Activity: Not on file   Other Topics Concern  . Not on file   Social History Narrative   No living will   No health care POA-- daughter would be the one.   Would accept resuscitation attempts but no prolonged artificial ventilation   Probably would accept tube feeds            Review of Systems  No rash No vomiting or diarrhea Appetite is fine     Objective:   Physical Exam  Constitutional: He appears well-developed and well-nourished. No distress.  HENT:  Mouth/Throat: Oropharynx is clear and moist. No oropharyngeal exudate.  No sinus  tenderness TMs normal Moderate nasal inflammation  Neck: Normal range of motion. Neck supple. No thyromegaly present.  Pulmonary/Chest: Effort normal and breath sounds normal. No respiratory distress. He has no wheezes. He has no rales.  Lymphadenopathy:    He has no cervical adenopathy.          Assessment & Plan:

## 2015-01-11 NOTE — Telephone Encounter (Signed)
Let him know that a virus has been going around the community and that is probably what is causing it (and antibiotics don't help) If he is worsening after 10-14 days--he probably needs to be evaluated (but usually it resolves without treatment except for some people who occasionally get secondary bacterial infections)

## 2015-01-19 ENCOUNTER — Telehealth: Payer: Self-pay

## 2015-01-19 MED ORDER — AMOXICILLIN 500 MG PO TABS: 1000.0000 mg | ORAL_TABLET | Freq: Two times a day (BID) | ORAL | Status: DC

## 2015-01-19 NOTE — Telephone Encounter (Signed)
Spoke with patient and advised results   

## 2015-01-19 NOTE — Telephone Encounter (Signed)
Please let him know I sent in the antibiotic

## 2015-01-19 NOTE — Telephone Encounter (Signed)
Pt left v/m; pt seen 01/11/15 and sinus infection did clear up but now pt has swollen salivary gland; this has happened in the past and pt request abx to [place]. Pt request cb.

## 2015-01-23 ENCOUNTER — Encounter: Payer: Self-pay | Admitting: Internal Medicine

## 2015-01-23 ENCOUNTER — Ambulatory Visit (INDEPENDENT_AMBULATORY_CARE_PROVIDER_SITE_OTHER): Payer: PPO | Admitting: Internal Medicine

## 2015-01-23 VITALS — BP 144/78 | HR 76 | Temp 98.0°F | Wt 197.0 lb

## 2015-01-23 DIAGNOSIS — K1120 Sialoadenitis, unspecified: Secondary | ICD-10-CM

## 2015-01-23 MED ORDER — CEPHALEXIN 500 MG PO CAPS: 500.0000 mg | ORAL_CAPSULE | Freq: Four times a day (QID) | ORAL | Status: DC

## 2015-01-23 NOTE — Patient Instructions (Signed)
Salivary Gland Infection °A salivary gland infection is an infection in one or more of the glands that produce spit (saliva). You have six major salivary glands. Each gland has a duct that carries saliva into your mouth. Saliva keeps your mouth moist and breaks down the food that you eat. It also helps to prevent tooth decay. °Two salivary glands are located just in front of your ears (parotid). The ducts for these glands open up inside your cheeks, near your back teeth. You also have two glands under your tongue (sublingual) and two glands under your jaw (submandibular). The ducts for these glands open under your tongue. Any salivary gland can become infected. Most infections occur in the parotid glands or submandibular glands. °CAUSES °Salivary glands can be infected by viruses or bacteria. °· The mumps virus is the most common cause of viral salivary gland infections, though mumps is now rare in many areas because of vaccination. °¨ This infection causes swelling in both parotid glands. °¨ Viral infections are more common in children. °· The bacteria that cause salivary gland infections are usually the same bacteria that normally live in your mouth. °¨ A stone can form in a salivary gland and block the flow of saliva. As a result, saliva backs up into the salivary gland. Bacteria may then start to grow behind the blockage and cause infection. °¨ Bacterial infections usually cause pain and swelling on one side of the face. Submandibular gland swelling occurs under the jaw. Parotid swelling occurs in front of the ear. °¨ Bacterial infections are more common in adults. °RISK FACTORS °Children who do not get the MMR (measles, mumps, rubella) vaccine are more likely to get mumps, which can cause a viral salivary gland infection. °Risk factors for bacterial infections include: °· Poor dental care (oral hygiene). °· Smoking. °· Not drinking enough water. °· Having a disease that causes dry mouth and dry eyes (Mikulicz  syndrome or Sjogren syndrome). °SIGNS AND SYMPTOMS °The main sign of salivary gland infection is a swollen salivary gland. This type of inflammation is often called sialadenitis. You may have swelling in front of your ear, under your jaw, or under your tongue. Swelling may get worse when you eat and decrease after you eat. Other signs and symptoms include: °· Pain. °· Tenderness. °· Redness. °· Dry mouth. °· Bad taste in your mouth. °· Difficulty chewing and swallowing. °· Fever. °DIAGNOSIS °Your health care provider may suspect a salivary gland infection based on your signs and symptoms. He or she will also do a physical exam. The health care provider will look and feel inside your mouth to see whether a stone is blocking a salivary gland duct. You may need to see an ear, nose, and throat specialist (ENT or otolaryngologist) for diagnosis and treatment. You may also need to have diagnostic tests, such as: °· An X-ray to check for a stone. °· Other imaging studies to look for an abscess and to rule out other causes of swelling. These tests may include: °¨ Ultrasound. °¨ CT scan. °¨ MRI. °· Culture and sensitivity test. This involves collecting a sample of pus for testing in the lab to see what bacteria grow and what antibiotics they are sensitive to. The testing sample may be: °¨ Swabbed from a salivary gland duct. °¨ Withdrawn from a swollen gland with a needle (aspiration). °TREATMENT °Viral salivary gland infections usually clear up without treatment. Bacterial infections are usually treated with antibiotic medicine. Severe infections that cause difficulty with swallowing may   be treated with an IV antibiotic in the hospital. Other treatments may include:  Probing and widening the salivary duct to allow a stone to pass. In some cases, a thin, flexible scope (endoscope) may be inserted into the duct to find a stone and remove it.  Breaking up a stone using sound waves.  Draining an infected gland (abscess)  with a needle.  In some cases, you may need surgery so your health care provider can:  Remove a stone.  Drain pus from an abscess.  Remove a badly infected gland. HOME CARE INSTRUCTIONS  Take medicines only as directed by your health care provider.  If you were prescribed an antibiotic medicine, finish it all even if you start to feel better.  Follow these instructions every few hours:  Suck on a lemon candy to stimulate the flow of saliva.  Put a warm compress over the gland.  Gently massage the gland.  Drink enough fluid to keep your urine clear or pale yellow.  Rinse your mouth with a mixture of warm water and salt every few hours. To make this mixture, add a pinch of salt to 1 cup of warm water.  Practice good oral hygiene by brushing and flossing your teeth after meals and before you go to bed.  Do not use any tobacco products, including cigarettes, chewing tobacco, or electronic cigarettes. If you need help quitting, ask your health care provider. SEEK MEDICAL CARE IF:  You have pain and swelling in your face, jaw, or mouth after eating.  You have persistent swelling in any of these places:  In front of your ear.  Under your jaw.  Inside your mouth. SEEK IMMEDIATE MEDICAL CARE IF:   You have pain and swelling in your face, jaw, or mouth that are getting worse.  Your pain and swelling make it hard to swallow or breathe.   This information is not intended to replace advice given to you by your health care provider. Make sure you discuss any questions you have with your health care provider.   Document Released: 02/29/2004 Document Revised: 02/11/2014 Document Reviewed: 06/23/2013 Elsevier Interactive Patient Education [employer].

## 2015-01-23 NOTE — Progress Notes (Signed)
Subjective:    Patient ID: Darren Atkins, male    DOB: 04-20-1940, 74 y.o.   MRN: [id number]  HPI  Pt presents to the clinic today with c/o jaw swelling. He reports this started 5 days ago. He called Dr. Silvio Pate, and he prescribed him Amoxicillin, but he has not noticed any improvement. He does have pain on the left side mostly. He denies difficulty swallowing or breathing. He has had salivary gland stones in the past and reports this is similar. He denies fever, chills or body aches. He has tried Advil cold and sinus with minimal relief.  Review of Systems      Past Medical History  Diagnosis Date  . Allergy   . Hypertension   . Anemia   . Osteoarthritis     Current Outpatient Prescriptions  Medication Sig Dispense Refill  . amLODipine (NORVASC) 10 MG tablet Take 1 tablet (10 mg total) by mouth daily. 90 tablet 3  . amoxicillin (AMOXIL) 500 MG tablet Take 2 tablets (1,000 mg total) by mouth 2 (two) times daily. 40 tablet 0  . cyanocobalamin (,VITAMIN B-12,) 1000 MCG/ML injection Inject 1 mL (1,000 mcg total) into the muscle every 30 (thirty) days. Dx:D51.0 3 mL 3  . docusate sodium (COLACE) 100 MG capsule Take 100 mg by mouth at bedtime as needed.      . fluticasone (FLONASE) 50 MCG/ACT nasal spray USE TWO SPRAY(S) IN EACH NOSTRIL ONCE DAILY 16 g 11  . folic acid (FOLVITE) 1 MG tablet Take 1 tablet (1 mg total) by mouth daily. 90 tablet 3  . lisinopril (PRINIVIL,ZESTRIL) 5 MG tablet Take 1 tablet (5 mg total) by mouth daily. 90 tablet 3  . Multiple Vitamin (MULTIVITAMIN) capsule Take 1 capsule by mouth daily.       No current facility-administered medications for this visit.    Allergies  Allergen Reactions  . Hydrochlorothiazide W-Triamterene     REACTION: Palps  . Naprosyn [Naproxen] Other (See Comments)    Insomnia, stomach upset  . Sulfonamide Derivatives     REACTION: unspecified    Family History  Problem Relation Age of Onset  . Hypertension Mother   .  Osteoporosis Mother   . Hypertension Father   . Coronary artery disease Neg Hx   . Diabetes Neg Hx   . Cancer Neg Hx     Social History   Social History  . Marital Status: Married    Spouse Name: N/A  . Number of Children: 1  . Years of Education: N/A   Occupational History  . Retired---- [employer] for [employer]     works part time helping with cows and goats   Social History Main Topics  . Smoking status: Never Smoker   . Smokeless tobacco: Former Systems developer    Types: [place] date: 02/05/1983  . Alcohol Use: No  . Drug Use: Not on file  . Sexual Activity: Not on file   Other Topics Concern  . Not on file   Social History Narrative   No living will   No health care POA-- daughter would be the one.   Would accept resuscitation attempts but no prolonged artificial ventilation   Probably would accept tube feeds              Constitutional: Denies fever, malaise, fatigue, headache or abrupt weight changes.  HEENT: Denies eye pain, eye redness, ear pain, ringing in the ears, wax buildup, runny nose, nasal congestion, bloody  nose, or sore throat. Respiratory: Denies difficulty breathing, shortness of breath, cough or sputum production.   Cardiovascular: Denies chest pain, chest tightness, palpitations or swelling in the hands or feet.  Musculoskeletal: Pt reports jaw swelling. Denies decrease in range of motion, difficulty with gait, muscle pain.  Skin: Denies redness, rashes, lesions or ulcercations.   No other specific complaints in a complete review of systems (except as listed in HPI above).  Objective:   Physical Exam   BP 144/78 mmHg  Pulse 76  Temp(Src) 98 F (36.7 C) (Oral)  Wt 197 lb (89.359 kg) Wt Readings from Last 3 Encounters:  01/23/15 197 lb (89.359 kg)  01/11/15 200 lb (90.719 kg)  09/22/14 194 lb (87.998 kg)    General: Appears his stated age,  in NAD. Skin: Warm, dry and intact. Redness noted of the right side of the jaw. HEENT:  Head: normal shape and size;  Throat/Mouth: Teeth present, mucosa pink and moist, no exudate, lesions or ulcerations noted. ? Salivary gland stones noted bilaterally. Neck:  No adenopathy noted. Swelling and hardness noted of the left submandibular gland. Neurological: Alert and oriented.   BMET    Component Value Date/Time   NA 137 05/03/2014 0816   K 4.6 05/03/2014 0816   CL 105 05/03/2014 0816   CO2 28 05/03/2014 0816   GLUCOSE 92 05/03/2014 0816   BUN 25* 05/03/2014 0816   CREATININE 1.67* 05/03/2014 0816   CALCIUM 9.1 05/03/2014 0816   GFRNONAA 47.13* 01/17/2010 0959   GFRAA 60 01/05/2008 0909    Lipid Panel     Component Value Date/Time   CHOL 182 01/24/2012 0837   TRIG 110.0 01/24/2012 0837   HDL 31.70* 01/24/2012 0837   CHOLHDL 6 01/24/2012 0837   VLDL 22.0 01/24/2012 0837   LDLCALC 128* 01/24/2012 0837    CBC    Component Value Date/Time   WBC 6.5 02/01/2014 0928   RBC 4.81 02/01/2014 0928   HGB 14.5 02/01/2014 0928   HCT 43.6 02/01/2014 0928   PLT 208.0 02/01/2014 0928   MCV 90.7 02/01/2014 0928   MCHC 33.2 02/01/2014 0928   RDW 13.5 02/01/2014 0928   LYMPHSABS 1.5 02/01/2014 0928   MONOABS 0.7 02/01/2014 0928   EOSABS 0.3 02/01/2014 0928   BASOSABS 0.0 02/01/2014 0928    Hgb A1C No results found for: HGBA1C      Assessment & Plan:   Submandibular gland swelling:  Stop Amoxil Start Keflex 500 mg QID x 10 days If persist, consider referral placed to ENT for further evaluation Return precautions given  RTC as needed or if symptoms persist or worsen

## 2015-01-23 NOTE — Progress Notes (Signed)
Pre visit review using our clinic review tool, if applicable. No additional management support is needed unless otherwise documented below in the visit note. 

## 2015-01-24 ENCOUNTER — Other Ambulatory Visit: Payer: Self-pay | Admitting: Internal Medicine

## 2015-01-24 ENCOUNTER — Telehealth: Payer: Self-pay

## 2015-01-24 DIAGNOSIS — R221 Localized swelling, mass and lump, neck: Principal | ICD-10-CM

## 2015-01-24 DIAGNOSIS — R220 Localized swelling, mass and lump, head: Secondary | ICD-10-CM

## 2015-01-24 NOTE — Telephone Encounter (Signed)
Faxed referral to [facility]. Called Christy at [facility], she is checking with the docs to see if he can be worked in. Alyse Low will call us back.  Pt aware we are working on getting him into [facility] or [facility].

## 2015-01-24 NOTE — Telephone Encounter (Signed)
Rosaria Ferries, this is an urgent referral. Can we get him scheduled for tomorrow.

## 2015-01-24 NOTE — Telephone Encounter (Signed)
Pt left v/m; pt seen 01/23/15 and saliva gland is no better and pt wants ENT referral ASAP. Pt request cb.

## 2015-01-25 ENCOUNTER — Other Ambulatory Visit: Payer: Self-pay | Admitting: Family Medicine

## 2015-01-25 ENCOUNTER — Ambulatory Visit
Admission: RE | Admit: 2015-01-25 | Discharge: 2015-01-25 | Disposition: A | Discharge status: Home / Self Care | Payer: PPO | Source: Ambulatory Visit | Attending: Family Medicine | Admitting: Family Medicine

## 2015-01-25 ENCOUNTER — Ambulatory Visit (INDEPENDENT_AMBULATORY_CARE_PROVIDER_SITE_OTHER): Payer: PPO | Admitting: Family Medicine

## 2015-01-25 ENCOUNTER — Encounter: Payer: Self-pay | Admitting: Family Medicine

## 2015-01-25 VITALS — BP 132/62 | HR 73 | Temp 97.9°F | Wt 197.0 lb

## 2015-01-25 DIAGNOSIS — K047 Periapical abscess without sinus: Secondary | ICD-10-CM | POA: Insufficient documentation

## 2015-01-25 DIAGNOSIS — M278 Other specified diseases of jaws: Secondary | ICD-10-CM

## 2015-01-25 DIAGNOSIS — R220 Localized swelling, mass and lump, head: Secondary | ICD-10-CM

## 2015-01-25 DIAGNOSIS — M869 Osteomyelitis, unspecified: Secondary | ICD-10-CM | POA: Insufficient documentation

## 2015-01-25 DIAGNOSIS — M272 Inflammatory conditions of jaws: Secondary | ICD-10-CM | POA: Insufficient documentation

## 2015-01-25 LAB — CBC WITH DIFFERENTIAL/PLATELET
BASOS ABS: 0 10*3/uL (ref 0.0–0.1)
Basophils Relative: 0.5 % (ref 0.0–3.0)
EOS ABS: 0.4 10*3/uL (ref 0.0–0.7)
Eosinophils Relative: 4.4 % (ref 0.0–5.0)
HCT: 41.8 % (ref 39.0–52.0)
Hemoglobin: 14 g/dL (ref 13.0–17.0)
LYMPHS ABS: 1.6 10*3/uL (ref 0.7–4.0)
Lymphocytes Relative: 17.5 % (ref 12.0–46.0)
MCHC: 33.4 g/dL (ref 30.0–36.0)
MCV: 89.6 fl (ref 78.0–100.0)
MONO ABS: 0.9 10*3/uL (ref 0.1–1.0)
MONOS PCT: 10 % (ref 3.0–12.0)
NEUTROS ABS: 6.3 10*3/uL (ref 1.4–7.7)
NEUTROS PCT: 67.6 % (ref 43.0–77.0)
PLATELETS: 360 10*3/uL (ref 150.0–400.0)
RBC: 4.67 Mil/uL (ref 4.22–5.81)
RDW: 13.4 % (ref 11.5–15.5)
WBC: 9.4 10*3/uL (ref 4.0–10.5)

## 2015-01-25 LAB — BASIC METABOLIC PANEL
BUN: 25 mg/dL — AB (ref 6–23)
CALCIUM: 9.3 mg/dL (ref 8.4–10.5)
CO2: 26 meq/L (ref 19–32)
CREATININE: 1.65 mg/dL — AB (ref 0.40–1.50)
Chloride: 104 mEq/L (ref 96–112)
GFR: 43.55 mL/min — AB (ref >60.00)
GLUCOSE: 97 mg/dL (ref 70–99)
Potassium: 4.1 mEq/L (ref 3.5–5.1)
SODIUM: 137 meq/L (ref 135–145)

## 2015-01-25 LAB — SEDIMENTATION RATE: SED RATE: 38 mm/h — AB (ref 0–22)

## 2015-01-25 LAB — HIGH SENSITIVITY CRP: CRP, High Sensitivity: 40.94 mg/L — ABNORMAL HIGH (ref 0.000–5.000)

## 2015-01-25 LAB — POCT I-STAT CREATININE: Creatinine, Ser: 1.8 mg/dL — ABNORMAL HIGH (ref 0.61–1.24)

## 2015-01-25 IMAGING — CT CT NECK W/ CM
3 of 5 series · 12 of 33 positions shown, 14 images · IV contrast (omnipaque)
Comparison: None.

CLINICAL DATA: Left jaw mass.  Swelling 1 week.

EXAM:
CT NECK WITH CONTRAST
TECHNIQUE: Multidetector CT imaging of the neck was performed using the
standard protocol following the bolus administration of intravenous
contrast.
CONTRAST:  60mL OMNIPAQUE IOHEXOL 300 MG/ML  SOLN

[Series 4: sag neck · sagittal · 0.48mm/px · 5 of 102 slices shown, 6 images]
[im 34/102  bone]
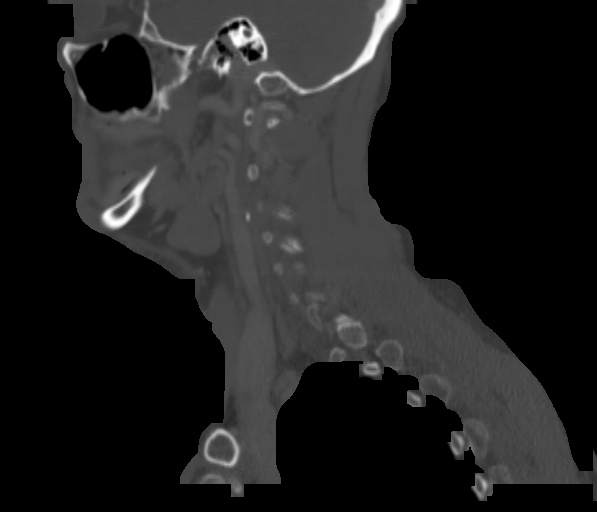
[im 43/102  bone]
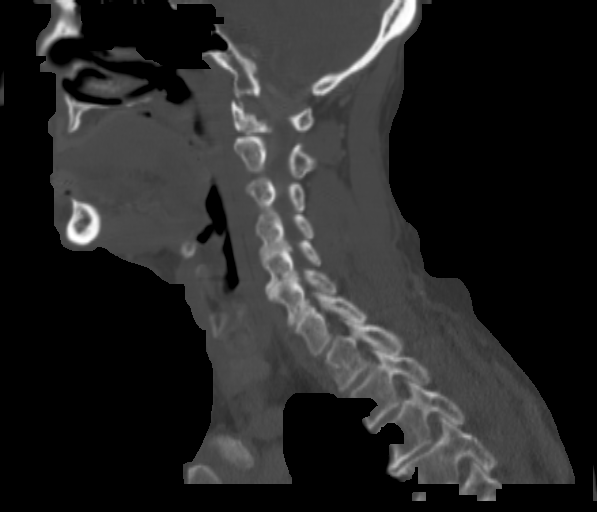
[im 51/102  soft-tissue]
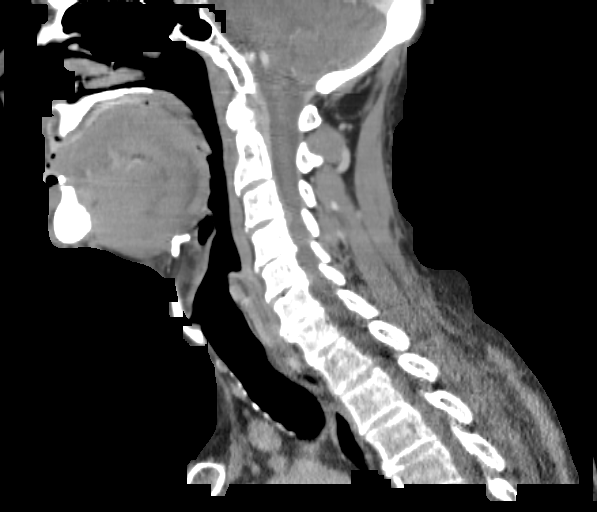
[im 51/102  bone]
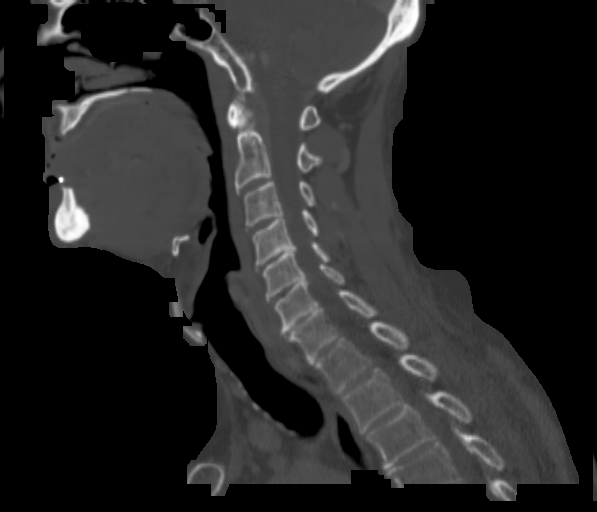
[im 59/102  bone]
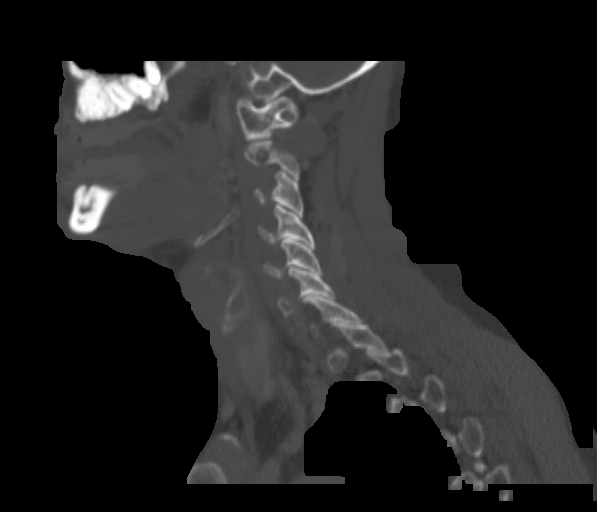
[im 68/102  bone]
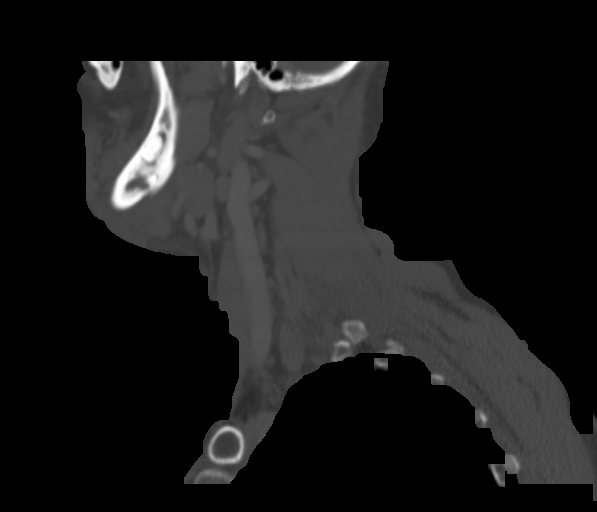

[Series 5: cor neck · coronal · 0.39mm/px · 3 of 128 slices shown]
[im 26/128  bone]
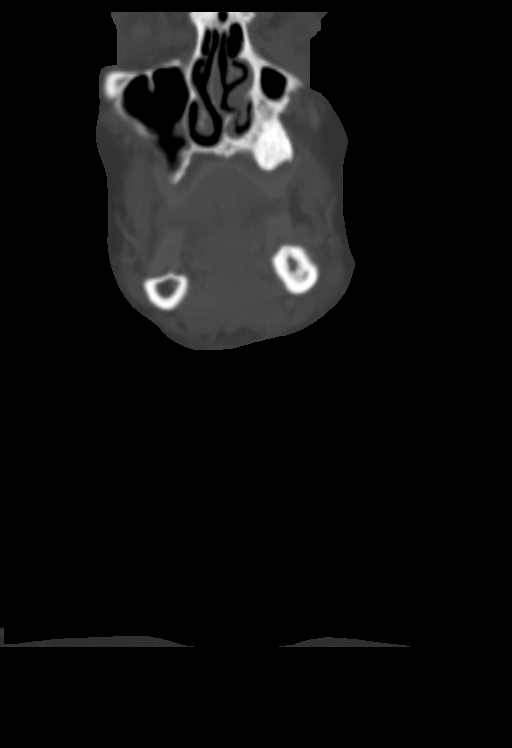
[im 51/128  bone]
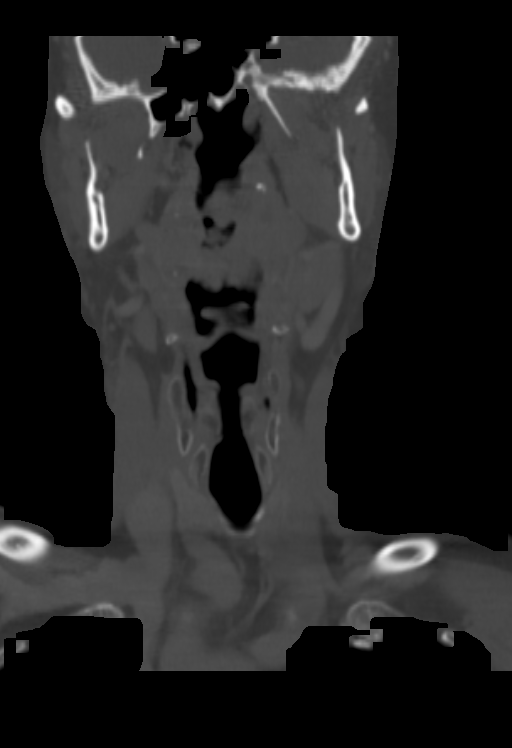
[im 77/128  bone]
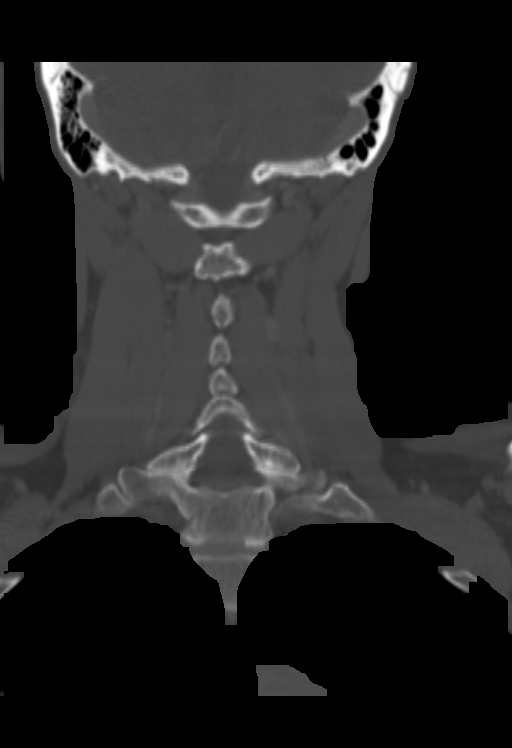

[Series 6: ax oropharynx · axial · 0.39mm/px · z∈[-380,-179]mm · 4 of 157 slices shown, 5 images]
[im 27/157  soft-tissue]
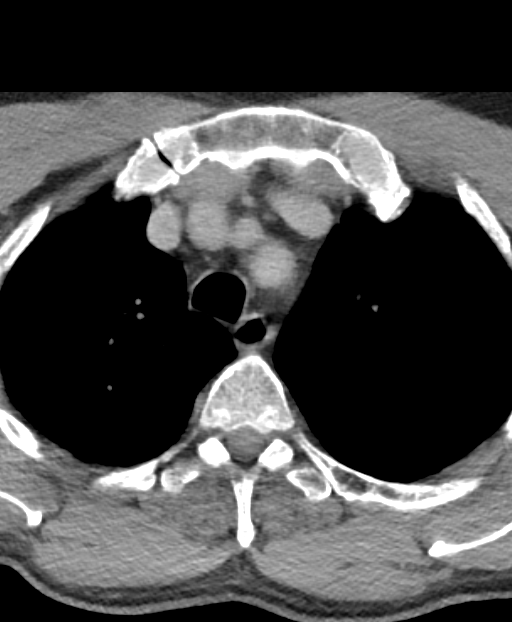
[im 27/157  bone]
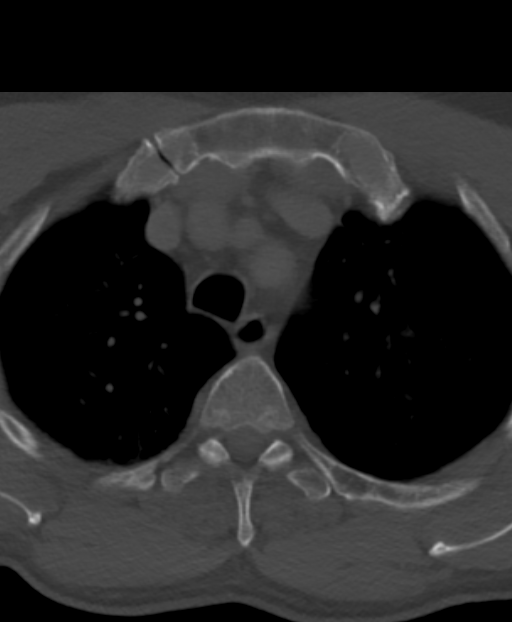
[im 53/157  bone]
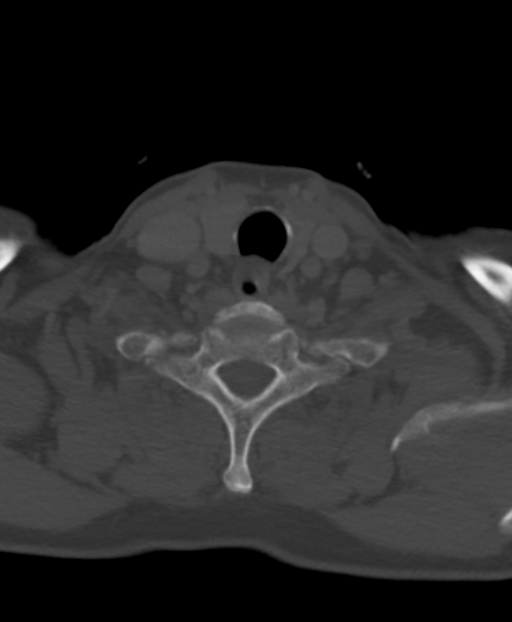
[im 105/157  bone]
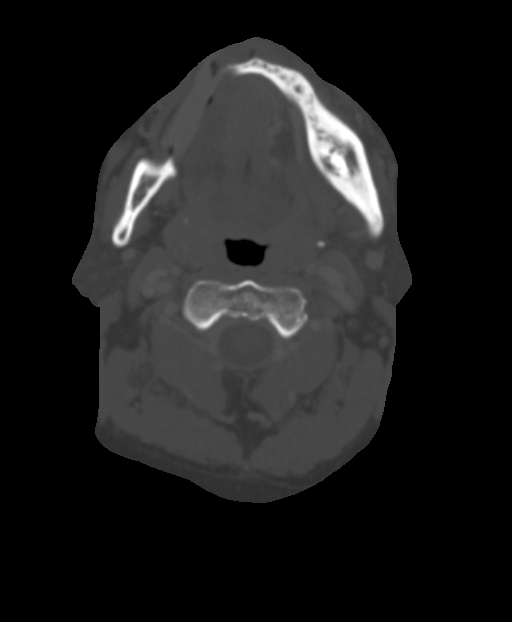
[im 131/157  bone]
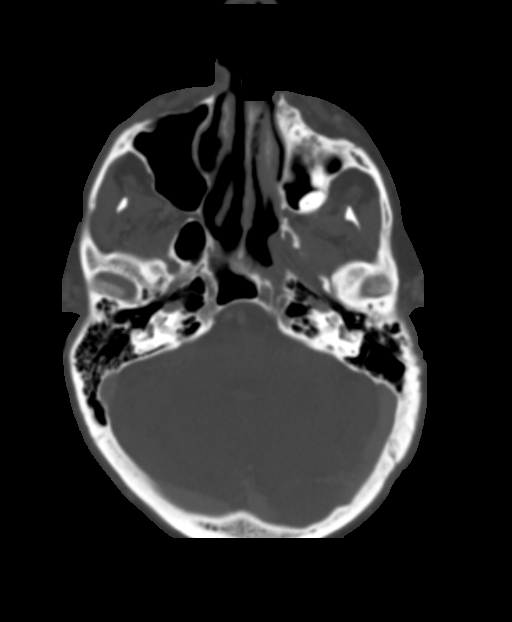

[12 of 33 positions shown; findings below may reference images not displayed]

FINDINGS: Pharynx and larynx: Tonsils contain calcification due to chronic
infection. No mass left tonsil. Normal larynx. No airway compromise

Salivary glands: Parotid and submandibular glands normal
bilaterally. No mass or calcification.

Thyroid: Negative

Lymph nodes: Negative for lymphadenopathy

Vascular: Mild atherosclerotic disease of the carotid bifurcation.
Carotid artery and jugular vein patent bilaterally.

Limited intracranial: Negative

Visualized orbits: Negative

Mastoids and visualized paranasal sinuses: Paranasal sinuses are
clear. Mastoid sinus clear bilaterally.

Skeleton: Impacted left upper premolar and molar teeth with
surrounding sclerosis of the maxilla. Teeth are eroded. However

Lucency around left lower second and third molars. There is a sinus
tract extending inferior to the left lower second molar with
adjacent extensive soft tissue swelling. Findings are consistent
with dental infection and osteomyelitis of the mandible. Sclerotic
changes the mandible extend anteriorly on the left. Soft tissue
swelling below the left mandible at the site of the sinus tract
measures approximately 15 mm. This is slightly lower density than
enhancing tissue and likely is an area of soft tissue infection
without a well-developed abscess. The platysmas is thickened and
there is edema in the overlying subcutaneous tissues.

Upper chest: Lung apices clear
IMPRESSION: Soft tissue swelling globe left mandible appears to represent dental
infection. There is phlegmon in the soft tissues without
well-defined abscess. There is sclerotic change in the left mandible
compatible with osteomyelitis. There is a sinus tract extending
inferior to the left lower second molar. There is also extensive
lucency around the left lower third molar.

Sclerotic changes left maxilla felt to be due to chronic dental
infection and impacted molars.

These results were called by telephone at the time of interpretation
on [DATE] at [DATE] to SKO, who verbally acknowledged
these results.

## 2015-01-25 MED ORDER — IOHEXOL 300 MG/ML  SOLN
60.0000 mL | Freq: Once | INTRAMUSCULAR | Status: AC | PRN
  Administered 2015-01-25: 60 mL via INTRAVENOUS

## 2015-01-25 MED ORDER — IOHEXOL 300 MG/ML  SOLN: 100.0000 mL | Freq: Once | INTRAMUSCULAR | Status: DC | PRN

## 2015-01-25 NOTE — Patient Instructions (Addendum)
Pass by lab for labwork and then see Rosaria Ferries for CT scan.  Keep appointment Friday with ENT.

## 2015-01-25 NOTE — Progress Notes (Addendum)
BP 132/62 mmHg  Pulse 73  Temp(Src) 97.9 F (36.6 C) (Oral)  Wt 197 lb (89.359 kg)  SpO2 95%   CC: recheck salivary gland  Subjective:    Patient ID: Darren Atkins, male    DOB: 1941/01/11, 74 y.o.   MRN: [id number]  HPI: Darren Atkins is a 74 y.o. male presenting on 01/25/2015 for No chief complaint on file.   2 wk h/o cold sxs which did resolve. Then 1 week ago noticed sudden swelling of L lower jaw gland. No fevers/chills, appetite changes or weight changes, no ear pain, wears dentures. Has been treating with ibuprofen 400mg  Q6 hours. Also on keflex 500mg  QID (started on Monday).  Never smoker. Remote smokeless tobacco.  No fmhx cancer.   Dentures for years, chronically poorly fitting, have dug into left lower gumline with erosion/growth over the last few years. Last saw dentist >5 yrs ago.   Seen here 01/11/2015 by PCP with viral URI, rec supportive care. Swelling noted 1 wk later so started on amoxicillin course.  Seen here 01/22/2015 by Rollene Fare with L jaw swelling ?L submandibular LAD treated with keflex 500mg  QID x 10 days. No improvement so referred to ENT. Here for a recheck. H/o left salivary adenitis treated by PCP with amox course 09/2014.   Relevant past medical, surgical, family and social history reviewed and updated as indicated. Interim medical history since our last visit reviewed. Allergies and medications reviewed and updated. Current Outpatient Prescriptions on File Prior to Visit  Medication Sig  . amLODipine (NORVASC) 10 MG tablet Take 1 tablet (10 mg total) by mouth daily.  . cephALEXin (KEFLEX) 500 MG capsule Take 1 capsule (500 mg total) by mouth 4 (four) times daily.  . cyanocobalamin (,VITAMIN B-12,) 1000 MCG/ML injection Inject 1 mL (1,000 mcg total) into the muscle every 30 (thirty) days. Dx:D51.0  . docusate sodium (COLACE) 100 MG capsule Take 100 mg by mouth at bedtime as needed.    . fluticasone (FLONASE) 50 MCG/ACT nasal spray USE TWO SPRAY(S) IN  EACH NOSTRIL ONCE DAILY  . folic acid (FOLVITE) 1 MG tablet Take 1 tablet (1 mg total) by mouth daily.  Marland Kitchen lisinopril (PRINIVIL,ZESTRIL) 5 MG tablet Take 1 tablet (5 mg total) by mouth daily.  . Multiple Vitamin (MULTIVITAMIN) capsule Take 1 capsule by mouth daily.     No current facility-administered medications on file prior to visit.   Past Medical History  Diagnosis Date  . Allergy   . Hypertension   . Anemia   . Osteoarthritis     Past Surgical History  Procedure Laterality Date  . Nm myoview ltd      [place]. Myoview- apical thinning, no ischemia EF 53% 02/08    Family History  Problem Relation Age of Onset  . Hypertension Mother   . Osteoporosis Mother   . Hypertension Father   . Coronary artery disease Neg Hx   . Diabetes Neg Hx   . Cancer Neg Hx     Social History  Substance Use Topics  . Smoking status: Never Smoker   . Smokeless tobacco: Former Systems developer    Types: [place] date: 02/05/1983  . Alcohol Use: No    Review of Systems Per HPI unless specifically indicated in ROS section     Objective:    BP 132/62 mmHg  Pulse 73  Temp(Src) 97.9 F (36.6 C) (Oral)  Wt 197 lb (89.359 kg)  SpO2 95%  Wt Readings from Last 3  Encounters:  01/25/15 197 lb (89.359 kg)  01/23/15 197 lb (89.359 kg)  01/11/15 200 lb (90.719 kg)    Physical Exam  Constitutional: He appears well-developed and well-nourished. No distress.  HENT:  Head: Normocephalic and atraumatic.    Right Ear: Hearing, tympanic membrane, external ear and ear canal normal.  Left Ear: Hearing, tympanic membrane, external ear and ear canal normal.  Nose: Nose normal. No mucosal edema or rhinorrhea. Right sinus exhibits no maxillary sinus tenderness and no frontal sinus tenderness. Left sinus exhibits no maxillary sinus tenderness and no frontal sinus tenderness.  Mouth/Throat: Uvula is midline, oropharynx is clear and moist and mucous membranes are normal. He has dentures. No trismus in the jaw. No  oropharyngeal exudate, posterior oropharyngeal edema, posterior oropharyngeal erythema or tonsillar abscesses.  Inflammation and firmness at stetson's duct on left Hard firm mass 4cm L lower jaw  Parotid gland without significant swelling Friable enlargement of left lower gumline inside mouth  Eyes: Conjunctivae and EOM are normal. Pupils are equal, round, and reactive to light. No scleral icterus.  Neck: Normal range of motion. Neck supple.  Cardiovascular: Normal rate, regular rhythm, normal heart sounds and intact distal pulses.   No murmur heard. Pulmonary/Chest: Effort normal and breath sounds normal. No respiratory distress. He has no wheezes. He has no rales.  Lymphadenopathy:    He has no cervical adenopathy.  Skin: Skin is warm and dry. No rash noted.  Nursing note and vitals reviewed.  Results for orders placed or performed in visit on [id number]  Basic metabolic panel  Result Value Ref Range   Sodium 137 135 - 145 mEq/L   Potassium 4.1 3.5 - 5.1 mEq/L   Chloride 104 96 - 112 mEq/L   CO2 26 19 - 32 mEq/L   Glucose, Bld 97 70 - 99 mg/dL   BUN 25 (H) 6 - 23 mg/dL   Creatinine, Ser 1.65 (H) 0.40 - 1.50 mg/dL   Calcium 9.3 8.4 - 10.5 mg/dL   GFR 43.55 (L) >60.00 mL/min  CBC with Differential/Platelet  Result Value Ref Range   WBC 9.4 4.0 - 10.5 K/uL   RBC 4.67 4.22 - 5.81 Mil/uL   Hemoglobin 14.0 13.0 - 17.0 g/dL   HCT 41.8 39.0 - 52.0 %   MCV 89.6 78.0 - 100.0 fl   MCHC 33.4 30.0 - 36.0 g/dL   RDW 13.4 11.5 - 15.5 %   Platelets 360.0 150.0 - 400.0 K/uL   Neutrophils Relative % 67.6 43.0 - 77.0 %   Lymphocytes Relative 17.5 12.0 - 46.0 %   Monocytes Relative 10.0 3.0 - 12.0 %   Eosinophils Relative 4.4 0.0 - 5.0 %   Basophils Relative 0.5 0.0 - 3.0 %   Neutro Abs 6.3 1.4 - 7.7 K/uL   Lymphs Abs 1.6 0.7 - 4.0 K/uL   Monocytes Absolute 0.9 0.1 - 1.0 K/uL   Eosinophils Absolute 0.4 0.0 - 0.7 K/uL   Basophils Absolute 0.0 0.0 - 0.1 K/uL  Sedimentation rate  Result  Value Ref Range   Sed Rate 38 (H) 0 - 22 mm/hr  High sensitivity CRP  Result Value Ref Range   CRP, High Sensitivity 40.940 (H) 0.000 - 5.000 mg/L      Assessment & Plan:   Problem List Items Addressed This Visit    Chronic osteomyelitis of jaw - Primary    Swelling of left lower jaw that has come on suddenly over the last week, slightly better with keflex, in setting of  chronically poorly fitting dentures. Concern for chronic osteo vs tumor vs other acute suppurative infection. Check Cr, CBC, CRP today as well as neck CT scan with contrast (assuming kidneys are stable). Will ask to do low dose CT given CKD.  rec keep appt Fri with ENT. Continue keflex for now           Follow up plan: No Follow-up on file.

## 2015-01-25 NOTE — Addendum Note (Signed)
Addended by: Ria Bush on: 01/25/2015 04:18 PM   Modules accepted: Orders, Medications

## 2015-01-25 NOTE — Assessment & Plan Note (Addendum)
Swelling of left lower jaw that has come on suddenly over the last week, slightly better with keflex, in setting of chronically poorly fitting dentures. Concern for chronic osteo vs tumor vs other acute suppurative infection. Check Cr, CBC, CRP today as well as neck CT scan with contrast (assuming kidneys are stable). Will ask to do low dose CT given CKD.  rec keep appt Fri with ENT. Continue keflex for now

## 2015-01-26 NOTE — Telephone Encounter (Signed)
Since he has evidence of chronic osteomyelitis from this--it should be covered as a medical condition. We will follow up with this depending on how the insurance handles it

## 2015-01-26 NOTE — Telephone Encounter (Signed)
Darren Atkins called me back to let me know how his appt went. He said there were two teeth in the gum that were infected, one of them was a wisdom tooth. The infection was there for awhile he said. They opened up his gum and had to remove the teeth and put a drain in. He has a few stitches and will followup with Dr Hendricks Limes in a week. They changed his antibiotic to clindamyacin 300mg  4 times a day and gave him 56 pills to take. They also gave him hydrocodone to take if he needs it. Darren Atkins said he paid $1800 today for everything but they will try to get some reimbursement from his medical insurance, [employer]. They couldn't use the CT disc unfortunately and had to do xrays also. Darren Atkins liked Dr Hendricks Limes and just wanted Dr Darnell Level and Dr Silvio Pate to know how everything went.

## 2015-01-31 ENCOUNTER — Encounter: Payer: Self-pay | Admitting: Internal Medicine

## 2015-01-31 ENCOUNTER — Ambulatory Visit (INDEPENDENT_AMBULATORY_CARE_PROVIDER_SITE_OTHER): Payer: PPO | Admitting: Internal Medicine

## 2015-01-31 VITALS — BP 130/80 | HR 75 | Temp 98.4°F | Wt 198.0 lb

## 2015-01-31 DIAGNOSIS — R6884 Jaw pain: Secondary | ICD-10-CM

## 2015-01-31 DIAGNOSIS — M272 Inflammatory conditions of jaws: Secondary | ICD-10-CM | POA: Insufficient documentation

## 2015-01-31 NOTE — Progress Notes (Signed)
Pre visit review using our clinic review tool, if applicable. No additional management support is needed unless otherwise documented below in the visit note. 

## 2015-01-31 NOTE — Progress Notes (Signed)
Subjective:    Patient ID: Darren DEIGNAN, male    DOB: [birth year], 74 y.o.   MRN: [id number]  HPI Here due to left jaw pain  Reviewed CT scan after Dr Synthia Innocent visit Had 2 teeth removed from left mandible Drain in place Going back in 2 days to oral surgeon Given 14 day course of clinda Swelling is 80% better Has had regular drainage from the drain  Having pain in maxillary area and over to left ear Worsened over the past 2 days Mandible area is better though Only noticed it since the surgery--not sure if it was there before also (but pain was much worse in mandible before the surgery)  No fever No night sweats or chills  Current Outpatient Prescriptions on File Prior to Visit  Medication Sig Dispense Refill  . amLODipine (NORVASC) 10 MG tablet Take 1 tablet (10 mg total) by mouth daily. 90 tablet 3  . cyanocobalamin (,VITAMIN B-12,) 1000 MCG/ML injection Inject 1 mL (1,000 mcg total) into the muscle every 30 (thirty) days. Dx:D51.0 3 mL 3  . docusate sodium (COLACE) 100 MG capsule Take 100 mg by mouth at bedtime as needed.      . fluticasone (FLONASE) 50 MCG/ACT nasal spray USE TWO SPRAY(S) IN EACH NOSTRIL ONCE DAILY 16 g 11  . folic acid (FOLVITE) 1 MG tablet Take 1 tablet (1 mg total) by mouth daily. 90 tablet 3  . lisinopril (PRINIVIL,ZESTRIL) 5 MG tablet Take 1 tablet (5 mg total) by mouth daily. 90 tablet 3  . Multiple Vitamin (MULTIVITAMIN) capsule Take 1 capsule by mouth daily.       No current facility-administered medications on file prior to visit.    Allergies  Allergen Reactions  . Hydrochlorothiazide W-Triamterene     REACTION: Palps  . Naprosyn [Naproxen] Other (See Comments)    Insomnia, stomach upset  . Sulfonamide Derivatives     REACTION: unspecified    Past Medical History  Diagnosis Date  . Allergy   . Hypertension   . Anemia   . Osteoarthritis     Past Surgical History  Procedure Laterality Date  . Nm myoview ltd      [facility]. Myoview- apical  thinning, no ischemia EF 53% 02/08    Family History  Problem Relation Age of Onset  . Hypertension Mother   . Osteoporosis Mother   . Hypertension Father   . Coronary artery disease Neg Hx   . Diabetes Neg Hx   . Cancer Neg Hx     Social History   Social History  . Marital Status: Married    Spouse Name: N/A  . Number of Children: 1  . Years of Education: N/A   Occupational History  . Retired---- [employer] for [employer]     works part time helping with cows and goats   Social History Main Topics  . Smoking status: Never Smoker   . Smokeless tobacco: Former Systems developer    Types: [place] date: 02/05/1983  . Alcohol Use: No  . Drug Use: Not on file  . Sexual Activity: Not on file   Other Topics Concern  . Not on file   Social History Narrative   No living will   No health care POA-- daughter would be the one.   Would accept resuscitation attempts but no prolonged artificial ventilation   Probably would accept tube feeds            Review of Systems Eating  okay-- only using upper dentures and soft food Sleep isn't great--but okay    Objective:   Physical Exam  HENT:  Some pressure over left maxilla but no marked tenderness TMJ negative on left  Significant swelling along left mandible (much better per patient) with extension posteriorly as well as towards the buccal mucose No sig external mandibular tenderness. Small persistent indurated area though          Assessment & Plan:

## 2015-01-31 NOTE — Assessment & Plan Note (Signed)
Could be extension from extensive infection in mandible--but more likely from the impacted premolar and molar seen on CT scan Will likely need these removed as well

## 2015-01-31 NOTE — Assessment & Plan Note (Signed)
With extractions, clearly much better On clinda--but may need extended course of 4-6 weeks Going back to Dr Hendricks Limes --oral surgeon [facility]) on Thursday

## 2015-02-03 ENCOUNTER — Encounter: Payer: PPO | Admitting: Internal Medicine

## 2015-02-04 ENCOUNTER — Other Ambulatory Visit: Payer: Self-pay | Admitting: Internal Medicine

## 2015-02-10 DIAGNOSIS — K1379 Other lesions of oral mucosa: Secondary | ICD-10-CM | POA: Diagnosis not present

## 2015-03-10 ENCOUNTER — Other Ambulatory Visit: Payer: Self-pay | Admitting: Internal Medicine

## 2015-04-03 ENCOUNTER — Encounter: Payer: Self-pay | Admitting: Internal Medicine

## 2015-04-03 ENCOUNTER — Ambulatory Visit (INDEPENDENT_AMBULATORY_CARE_PROVIDER_SITE_OTHER): Payer: PPO | Admitting: Internal Medicine

## 2015-04-03 VITALS — BP 138/70 | HR 79 | Temp 98.1°F | Ht 72.0 in | Wt 197.0 lb

## 2015-04-03 DIAGNOSIS — G629 Polyneuropathy, unspecified: Secondary | ICD-10-CM | POA: Diagnosis not present

## 2015-04-03 DIAGNOSIS — N183 Chronic kidney disease, stage 3 unspecified: Secondary | ICD-10-CM

## 2015-04-03 DIAGNOSIS — I10 Essential (primary) hypertension: Secondary | ICD-10-CM

## 2015-04-03 DIAGNOSIS — D510 Vitamin B12 deficiency anemia due to intrinsic factor deficiency: Secondary | ICD-10-CM

## 2015-04-03 DIAGNOSIS — Z7189 Other specified counseling: Secondary | ICD-10-CM

## 2015-04-03 DIAGNOSIS — Z0000 Encounter for general adult medical examination without abnormal findings: Secondary | ICD-10-CM | POA: Diagnosis not present

## 2015-04-03 DIAGNOSIS — J301 Allergic rhinitis due to pollen: Secondary | ICD-10-CM

## 2015-04-03 LAB — CBC WITH DIFFERENTIAL/PLATELET
BASOS ABS: 0 10*3/uL (ref 0.0–0.1)
Basophils Relative: 0.4 % (ref 0.0–3.0)
EOS ABS: 0.3 10*3/uL (ref 0.0–0.7)
EOS PCT: 3.2 % (ref 0.0–5.0)
HCT: 42.3 % (ref 39.0–52.0)
Hemoglobin: 14.1 g/dL (ref 13.0–17.0)
LYMPHS ABS: 2.2 10*3/uL (ref 0.7–4.0)
Lymphocytes Relative: 23 % (ref 12.0–46.0)
MCHC: 33.4 g/dL (ref 30.0–36.0)
MCV: 87.8 fl (ref 78.0–100.0)
Monocytes Absolute: 1.1 10*3/uL — ABNORMAL HIGH (ref 0.1–1.0)
Monocytes Relative: 11.3 % (ref 3.0–12.0)
Neutro Abs: 6 10*3/uL (ref 1.4–7.7)
Neutrophils Relative %: 62.1 % (ref 43.0–77.0)
Platelets: 261 10*3/uL (ref 150.0–400.0)
RBC: 4.82 Mil/uL (ref 4.22–5.81)
RDW: 13.6 % (ref 11.5–15.5)
WBC: 9.6 10*3/uL (ref 4.0–10.5)

## 2015-04-03 LAB — COMPREHENSIVE METABOLIC PANEL
ALBUMIN: 4.2 g/dL (ref 3.5–5.2)
ALK PHOS: 79 U/L (ref 39–117)
ALT: 25 U/L (ref 0–53)
AST: 20 U/L (ref 0–37)
BUN: 24 mg/dL — ABNORMAL HIGH (ref 6–23)
CO2: 27 mEq/L (ref 19–32)
Calcium: 9.4 mg/dL (ref 8.4–10.5)
Chloride: 104 mEq/L (ref 96–112)
Creatinine, Ser: 1.69 mg/dL — ABNORMAL HIGH (ref 0.40–1.50)
GFR: 42.34 mL/min — AB (ref >60.00)
Glucose, Bld: 98 mg/dL (ref 70–99)
POTASSIUM: 4.4 meq/L (ref 3.5–5.1)
Sodium: 138 mEq/L (ref 135–145)
TOTAL PROTEIN: 7.7 g/dL (ref 6.0–8.3)
Total Bilirubin: 1.2 mg/dL (ref 0.2–1.2)

## 2015-04-03 LAB — VITAMIN B12: VITAMIN B 12: 660 pg/mL (ref 211–911)

## 2015-04-03 NOTE — Assessment & Plan Note (Signed)
I have personally reviewed the Medicare Annual Wellness questionnaire and have noted 1. The patient's medical and social history 2. Their use of alcohol, tobacco or illicit drugs 3. Their current medications and supplements 4. The patient's functional ability including ADL's, fall risks, home safety risks and hearing or visual             impairment. 5. Diet and physical activities 6. Evidence for depression or mood disorders  The patients weight, height, BMI and visual acuity have been recorded in the chart I have made referrals, counseling and provided education to the patient based review of the above and I have provided the pt with a written personalized care plan for preventive services.  I have provided you with a copy of your personalized plan for preventive services. Please take the time to review along with your updated medication list.  Annual flu shots---otherwise UTD No PSA due to age Colon would be due 2020---will discuss then Keeps in shape

## 2015-04-03 NOTE — Progress Notes (Signed)
Pre visit review using our clinic review tool, if applicable. No additional management support is needed unless otherwise documented below in the visit note. 

## 2015-04-03 NOTE — Assessment & Plan Note (Signed)
Does okay with the flonase 

## 2015-04-03 NOTE — Assessment & Plan Note (Signed)
See social history Blank forms given 

## 2015-04-03 NOTE — Assessment & Plan Note (Signed)
BP Readings from Last 3 Encounters:  04/03/15 138/70  01/31/15 130/80  01/25/15 132/62   Good control No changes needed

## 2015-04-03 NOTE — Assessment & Plan Note (Signed)
Will refer to nephrology if any progression On low dose ACEI

## 2015-04-03 NOTE — Assessment & Plan Note (Signed)
Gets sensory changes when B12 is low No regular problems though

## 2015-04-03 NOTE — Progress Notes (Signed)
Subjective:    Patient ID: Darren Atkins, male    DOB: 1941-02-01, 75 y.o.   MRN: [id number]  HPI Here for Medicare wellness and follow up of chronic medical conditions Wife is with him Reviewed form and advanced directives Reviewed other doctors-- Dr Delice Bison is dentist No tobacco or alcohol Physically active daily--cattle and some goats Vision and hearing are fine No falls No depression or anhedonia Independent with instrumental ADLs No sig cognitive changes  The jaw problem is resolved Did have infected teeth removed clindamycin for 2 weeks and then another one for an additional week Trying his original lower plate again  No problems with BP meds No chest pain No palpitations  No dizziness or syncope No ankle swelling Doesn't check BP  Still gives himself B12 monthly Last time almost a month ago He can tell when his levels are lower---gets foot tingling. Known neuropathy from this  Has had some allergy symptoms On flonase regularly  Current Outpatient Prescriptions on File Prior to Visit  Medication Sig Dispense Refill  . amLODipine (NORVASC) 10 MG tablet TAKE ONE TABLET BY MOUTH DAILY 90 tablet 0  . cyanocobalamin (,VITAMIN B-12,) 1000 MCG/ML injection Inject 1 mL (1,000 mcg total) into the muscle every 30 (thirty) days. Dx:D51.0 3 mL 3  . docusate sodium (COLACE) 100 MG capsule Take 100 mg by mouth at bedtime as needed.      . fluticasone (FLONASE) 50 MCG/ACT nasal spray USE TWO SPRAY(S) IN EACH NOSTRIL ONCE DAILY 16 g 11  . folic acid (FOLVITE) 1 MG tablet TAKE ONE TABLET BY MOUTH DAILY 90 tablet 0  . lisinopril (PRINIVIL,ZESTRIL) 5 MG tablet TAKE ONE TABLET BY MOUTH ONCE DAILY 90 tablet 0  . Multiple Vitamin (MULTIVITAMIN) capsule Take 1 capsule by mouth daily.       No current facility-administered medications on file prior to visit.    Allergies  Allergen Reactions  . Hydrochlorothiazide W-Triamterene     REACTION: Palps  . Naprosyn [Naproxen] Other (See  Comments)    Insomnia, stomach upset  . Sulfonamide Derivatives     REACTION: unspecified    Past Medical History  Diagnosis Date  . Allergy   . Hypertension   . Anemia   . Osteoarthritis     Past Surgical History  Procedure Laterality Date  . Nm myoview ltd      [place]. Myoview- apical thinning, no ischemia EF 53% 02/08    Family History  Problem Relation Age of Onset  . Hypertension Mother   . Osteoporosis Mother   . Hypertension Father   . Coronary artery disease Neg Hx   . Diabetes Neg Hx   . Cancer Neg Hx     Social History   Social History  . Marital Status: Married    Spouse Name: N/A  . Number of Children: 1  . Years of Education: N/A   Occupational History  . Retired---- [employer] for [employer]     works part time helping with cows and goats   Social History Main Topics  . Smoking status: Never Smoker   . Smokeless tobacco: Former Systems developer    Types: [place] date: 02/05/1983  . Alcohol Use: No  . Drug Use: Not on file  . Sexual Activity: Not on file   Other Topics Concern  . Not on file   Social History Narrative   No living will   No health care POA-- daughter would be the one.  Would accept resuscitation attempts but no prolonged artificial ventilation   Probably would accept tube feeds            Review of Systems Full dentures Sleeps okay--never very well Appetite is fine Weight stable Wears seat belt No back or joint pain Bowels are okay---his usual. No blood. No abdominal pain, N/V or heartburn Voids fine. Nocturia is stable x 1. No daytime problems Mild scaly spots on face---nothing worrisome    Objective:   Physical Exam  Constitutional: He is oriented to person, place, and time. He appears well-developed. No distress.  HENT:  Mouth/Throat: Oropharynx is clear and moist. No oropharyngeal exudate.  Full dentures  Neck: Normal range of motion. Neck supple. No thyromegaly present.  Cardiovascular: Normal rate,  regular rhythm, normal heart sounds and intact distal pulses.  Exam reveals no gallop.   No murmur heard. Pulmonary/Chest: Effort normal and breath sounds normal. No respiratory distress. He has no wheezes. He has no rales.  Abdominal: Soft. There is no tenderness.  Musculoskeletal: He exhibits no edema or tenderness.  Lymphadenopathy:    He has no cervical adenopathy.  Neurological: He is alert and oriented to person, place, and time.  President-- "Daisy Floro, Obama, Bush" [phone] D-l-o-r-w Recall 3/3  Skin: No rash noted. No erythema.  Psychiatric: He has a normal mood and affect. His behavior is normal.          Assessment & Plan:

## 2015-04-03 NOTE — Assessment & Plan Note (Signed)
Does his own B12 shots Will check trough level today

## 2015-04-04 ENCOUNTER — Encounter: Payer: Self-pay | Admitting: *Deleted

## 2015-05-08 ENCOUNTER — Other Ambulatory Visit: Payer: Self-pay | Admitting: Internal Medicine

## 2015-06-12 ENCOUNTER — Other Ambulatory Visit: Payer: Self-pay | Admitting: Internal Medicine

## 2015-08-08 ENCOUNTER — Other Ambulatory Visit: Payer: Self-pay | Admitting: Internal Medicine

## 2016-02-12 ENCOUNTER — Other Ambulatory Visit: Payer: Self-pay | Admitting: Internal Medicine

## 2016-04-04 ENCOUNTER — Ambulatory Visit: Payer: PPO

## 2016-04-29 ENCOUNTER — Ambulatory Visit (INDEPENDENT_AMBULATORY_CARE_PROVIDER_SITE_OTHER): Payer: PPO | Admitting: Internal Medicine

## 2016-04-29 ENCOUNTER — Encounter: Payer: Self-pay | Admitting: Internal Medicine

## 2016-04-29 VITALS — BP 140/86 | HR 97 | Temp 98.3°F | Ht 71.0 in | Wt 198.0 lb

## 2016-04-29 DIAGNOSIS — N183 Chronic kidney disease, stage 3 unspecified: Secondary | ICD-10-CM

## 2016-04-29 DIAGNOSIS — G629 Polyneuropathy, unspecified: Secondary | ICD-10-CM | POA: Diagnosis not present

## 2016-04-29 DIAGNOSIS — Z0000 Encounter for general adult medical examination without abnormal findings: Secondary | ICD-10-CM | POA: Diagnosis not present

## 2016-04-29 DIAGNOSIS — Z7189 Other specified counseling: Secondary | ICD-10-CM | POA: Diagnosis not present

## 2016-04-29 DIAGNOSIS — D510 Vitamin B12 deficiency anemia due to intrinsic factor deficiency: Secondary | ICD-10-CM

## 2016-04-29 DIAGNOSIS — I10 Essential (primary) hypertension: Secondary | ICD-10-CM | POA: Diagnosis not present

## 2016-04-29 LAB — CBC WITH DIFFERENTIAL/PLATELET
Basophils Absolute: 0 K/uL (ref 0.0–0.1)
Basophils Relative: 0.6 % (ref 0.0–3.0)
Eosinophils Absolute: 0.2 K/uL (ref 0.0–0.7)
Eosinophils Relative: 1.9 % (ref 0.0–5.0)
HCT: 41.5 % (ref 39.0–52.0)
Hemoglobin: 14.3 g/dL (ref 13.0–17.0)
Lymphocytes Relative: 12.2 % (ref 12.0–46.0)
Lymphs Abs: 1 K/uL (ref 0.7–4.0)
MCHC: 34.4 g/dL (ref 30.0–36.0)
MCV: 88.1 fl (ref 78.0–100.0)
Monocytes Absolute: 1 K/uL (ref 0.1–1.0)
Monocytes Relative: 11.8 % (ref 3.0–12.0)
Neutro Abs: 6 K/uL (ref 1.4–7.7)
Neutrophils Relative %: 73.5 % (ref 43.0–77.0)
Platelets: 238 K/uL (ref 150.0–400.0)
RBC: 4.72 Mil/uL (ref 4.22–5.81)
RDW: 14 % (ref 11.5–15.5)
WBC: 8.1 K/uL (ref 4.0–10.5)

## 2016-04-29 LAB — COMPREHENSIVE METABOLIC PANEL WITH GFR
ALT: 19 U/L (ref 0–53)
AST: 16 U/L (ref 0–37)
Albumin: 4 g/dL (ref 3.5–5.2)
Alkaline Phosphatase: 73 U/L (ref 39–117)
BUN: 17 mg/dL (ref 6–23)
CO2: 26 meq/L (ref 19–32)
Calcium: 9 mg/dL (ref 8.4–10.5)
Chloride: 104 meq/L (ref 96–112)
Creatinine, Ser: 1.55 mg/dL — ABNORMAL HIGH (ref 0.40–1.50)
GFR: 46.65 mL/min — ABNORMAL LOW (ref >60.00)
Glucose, Bld: 99 mg/dL (ref 70–99)
Potassium: 4 meq/L (ref 3.5–5.1)
Sodium: 136 meq/L (ref 135–145)
Total Bilirubin: 1.7 mg/dL — ABNORMAL HIGH (ref 0.2–1.2)
Total Protein: 6.9 g/dL (ref 6.0–8.3)

## 2016-04-29 LAB — VITAMIN B12: VITAMIN B 12: 659 pg/mL (ref 211–911)

## 2016-04-29 MED ORDER — LISINOPRIL 10 MG PO TABS: 10.0000 mg | ORAL_TABLET | Freq: Every day | ORAL | 3 refills | Status: DC

## 2016-04-29 MED ORDER — CYANOCOBALAMIN 1000 MCG/ML IJ SOLN: INTRAMUSCULAR | 3 refills | Status: DC

## 2016-04-29 NOTE — Assessment & Plan Note (Signed)
Still hasn't done it See social history

## 2016-04-29 NOTE — Assessment & Plan Note (Signed)
Mild due to B12 deficiency

## 2016-04-29 NOTE — Assessment & Plan Note (Signed)
Tends to run higher at home Takes NSAID regularly---like at least once a day---discussed  BP Readings from Last 3 Encounters:  04/29/16 140/86  04/03/15 138/70  01/31/15 130/80   Repeat 150/84 Will increase lisinopril and monitor renal function

## 2016-04-29 NOTE — Assessment & Plan Note (Signed)
I have personally reviewed the Medicare Annual Wellness questionnaire and have noted 1. The patient's medical and social history 2. Their use of alcohol, tobacco or illicit drugs 3. Their current medications and supplements 4. The patient's functional ability including ADL's, fall risks, home safety risks and hearing or visual             impairment. 5. Diet and physical activities 6. Evidence for depression or mood disorders  The patients weight, height, BMI and visual acuity have been recorded in the chart I have made referrals, counseling and provided education to the patient based review of the above and I have provided the pt with a written personalized care plan for preventive services.  I have provided you with a copy of your personalized plan for preventive services. Please take the time to review along with your updated medication list.  No cancer screening due to age--will consider  FIT in 2020 UTD on immunizations Keeps fit  Yearly flu vaccine

## 2016-04-29 NOTE — Assessment & Plan Note (Signed)
Gives himself B12 shots Due now

## 2016-04-29 NOTE — Progress Notes (Signed)
Pre visit review using our clinic review tool, if applicable. No additional management support is needed unless otherwise documented below in the visit note. 

## 2016-04-29 NOTE — Progress Notes (Signed)
Subjective:    Patient ID: Darren Atkins, male    DOB: 12-28-1940, 76 y.o.   MRN: [id number]  HPI Here for Medicare wellness and follow up of chronic health conditions Reviewed form and advanced directives Reviewed other doctors No alcohol or tobacco Regular physical activity with his work No falls No depression or anhedonia Vision is fine Mild hearing problems Independent with instrumental ADLs No apparent memory problems  Started with sinus infection yesterday Clear nasal drainage Dry hacking cough No fever or SOB  He does check his BP at times Runs higher at home generally--- 160/90 or so Did correlate here in past No chest pain No palpitations No edema No dizziness or syncope  No urinary problems Voids okay Nocturia at times--not a big deal  Continues on B12 shots Will still get feet tingling at times---especially if he forgets his shot No weakness  Current Outpatient Prescriptions on File Prior to Visit  Medication Sig Dispense Refill  . amLODipine (NORVASC) 10 MG tablet TAKE ONE TABLET BY MOUTH DAILY 90 tablet 3  . cyanocobalamin (,VITAMIN B-12,) 1000 MCG/ML injection INJECT ONE ML INTRAMUSCULARLY  EVERY 30 DAYS 3 mL 3  . docusate sodium (COLACE) 100 MG capsule Take 100 mg by mouth at bedtime as needed.      . fluticasone (FLONASE) 50 MCG/ACT nasal spray USE TWO SPRAY(S) IN EACH NOSTRIL ONCE DAILY 16 g 11  . folic acid (FOLVITE) 1 MG tablet TAKE ONE TABLET BY MOUTH DAILY 90 tablet 3  . lisinopril (PRINIVIL,ZESTRIL) 5 MG tablet TAKE ONE TABLET BY MOUTH ONCE DAILY 90 tablet 0  . Multiple Vitamin (MULTIVITAMIN) capsule Take 1 capsule by mouth daily.       No current facility-administered medications on file prior to visit.     Allergies  Allergen Reactions  . Hydrochlorothiazide W-Triamterene     REACTION: Palps  . Naprosyn [Naproxen] Other (See Comments)    Insomnia, stomach upset  . Sulfonamide Derivatives     REACTION: unspecified    Past Medical  History:  Diagnosis Date  . Allergy   . Anemia   . Hypertension   . Osteoarthritis     Past Surgical History:  Procedure Laterality Date  . [place]. Myoview- apical thinning, no ischemia EF 53% 02/08    Family History  Problem Relation Age of Onset  . Hypertension Mother   . Osteoporosis Mother   . Hypertension Father   . Coronary artery disease Neg Hx   . Diabetes Neg Hx   . Cancer Neg Hx     Social History   Social History  . Marital status: Married    Spouse name: N/A  . Number of children: 1  . Years of education: N/A   Occupational History  . 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 Smoking status: Never Smoker  . Smokeless tobacco: Former Systems developer    Types: Chew    Quit date: 02/05/1983  . Alcohol use No  . Drug use: Unknown  . Sexual activity: Not on file   Other Topics Concern  . Not on file   Social History Narrative   No living will   No health care POA-- daughter would be the one.   Would accept resuscitation attempts but no prolonged artificial ventilation   Probably would accept tube feeds  Review of Systems Sleeps well Uses the flonase and advil cold and sinus for allergies (that raises BP). Is taking that now Appetite is fine Weight stable Wears seat belt Teeth fine-- keeps up with dentist ---Dr Delice Bison No heartburn or dysphagia Bowels are fine No skin rash or suspicious leisons    Objective:   Physical Exam  Constitutional: He is oriented to person, place, and time. He appears well-nourished. No distress.  HENT:  Mouth/Throat: Oropharynx is clear and moist. No oropharyngeal exudate.  Neck: No thyromegaly present.  Cardiovascular: Normal rate, regular rhythm, normal heart sounds and intact distal pulses.  Exam reveals no gallop.   No murmur heard. Pulmonary/Chest: Effort normal and breath sounds normal. No respiratory  distress. He has no wheezes. He has no rales.  Abdominal: Soft. There is no tenderness.  Musculoskeletal: He exhibits no edema or tenderness.  Lymphadenopathy:    He has no cervical adenopathy.  Neurological: He is alert and oriented to person, place, and time.  President--- "Darren Atkins, Darren Atkins---?" [phone] D-l-r-o-w Recall 3/3  Skin: No rash noted. No erythema.  Psychiatric: He has a normal mood and affect. His behavior is normal.          Assessment & Plan:

## 2016-04-29 NOTE — Assessment & Plan Note (Signed)
Discussed --- No NSAIDs Will check labs  Increase ACEI

## 2016-04-29 NOTE — Patient Instructions (Signed)
Please stop all over the counter anti inflammatories---like advil or aleve. It is okay to take tylenol (acetaminophen).

## 2016-05-15 ENCOUNTER — Other Ambulatory Visit: Payer: Self-pay | Admitting: Internal Medicine

## 2016-05-21 ENCOUNTER — Other Ambulatory Visit: Payer: Self-pay | Admitting: Internal Medicine

## 2016-06-03 ENCOUNTER — Other Ambulatory Visit (INDEPENDENT_AMBULATORY_CARE_PROVIDER_SITE_OTHER): Payer: PPO

## 2016-06-03 DIAGNOSIS — I10 Essential (primary) hypertension: Secondary | ICD-10-CM

## 2016-06-03 LAB — RENAL FUNCTION PANEL
Albumin: 4 g/dL (ref 3.5–5.2)
BUN: 18 mg/dL (ref 6–23)
CO2: 29 mEq/L (ref 19–32)
Calcium: 9 mg/dL (ref 8.4–10.5)
Chloride: 103 mEq/L (ref 96–112)
Creatinine, Ser: 1.62 mg/dL — ABNORMAL HIGH (ref 0.40–1.50)
GFR: 44.32 mL/min — ABNORMAL LOW (ref >60.00)
Glucose, Bld: 107 mg/dL — ABNORMAL HIGH (ref 70–99)
Phosphorus: 3.5 mg/dL (ref 2.3–4.6)
Potassium: 4.2 mEq/L (ref 3.5–5.1)
Sodium: 137 mEq/L (ref 135–145)

## 2016-12-02 ENCOUNTER — Telehealth: Payer: Self-pay | Admitting: Internal Medicine

## 2016-12-02 NOTE — Telephone Encounter (Signed)
Find out which toe. I don't have a record of any prior gout--has he had it before? I will probably have to check him if it persists (he shouldn't take NSAIDs so not okay to just try that) Will need to try to add him on if needed

## 2016-12-02 NOTE — Telephone Encounter (Signed)
Spoke to pt. He was fine with waiting until Wednesday to be seen.

## 2016-12-02 NOTE — Telephone Encounter (Signed)
Copied from [place] #[id number]. Topic: Appointment Scheduling - Scheduling Inquiry for Clinic >> Dec 02, 2016 10:24 AM Neva Seat wrote: Reason for CRM:   Woke up with toe hurting thinking it may be gout .

## 2016-12-04 ENCOUNTER — Ambulatory Visit: Payer: PPO | Admitting: Internal Medicine

## 2017-02-20 DIAGNOSIS — H5203 Hypermetropia, bilateral: Secondary | ICD-10-CM | POA: Diagnosis not present

## 2017-02-20 DIAGNOSIS — H52223 Regular astigmatism, bilateral: Secondary | ICD-10-CM | POA: Diagnosis not present

## 2017-04-30 ENCOUNTER — Ambulatory Visit (INDEPENDENT_AMBULATORY_CARE_PROVIDER_SITE_OTHER): Payer: PPO | Admitting: Internal Medicine

## 2017-04-30 ENCOUNTER — Encounter: Payer: Self-pay | Admitting: Internal Medicine

## 2017-04-30 VITALS — BP 138/86 | HR 71 | Temp 97.8°F | Ht 70.5 in | Wt 197.0 lb

## 2017-04-30 DIAGNOSIS — N183 Chronic kidney disease, stage 3 unspecified: Secondary | ICD-10-CM

## 2017-04-30 DIAGNOSIS — Z0000 Encounter for general adult medical examination without abnormal findings: Secondary | ICD-10-CM | POA: Diagnosis not present

## 2017-04-30 DIAGNOSIS — D510 Vitamin B12 deficiency anemia due to intrinsic factor deficiency: Secondary | ICD-10-CM | POA: Diagnosis not present

## 2017-04-30 DIAGNOSIS — Z7189 Other specified counseling: Secondary | ICD-10-CM

## 2017-04-30 DIAGNOSIS — Z23 Encounter for immunization: Secondary | ICD-10-CM | POA: Diagnosis not present

## 2017-04-30 DIAGNOSIS — G629 Polyneuropathy, unspecified: Secondary | ICD-10-CM

## 2017-04-30 DIAGNOSIS — I10 Essential (primary) hypertension: Secondary | ICD-10-CM | POA: Diagnosis not present

## 2017-04-30 LAB — T4, FREE: FREE T4: 0.74 ng/dL (ref 0.60–1.60)

## 2017-04-30 LAB — COMPREHENSIVE METABOLIC PANEL
ALBUMIN: 4 g/dL (ref 3.5–5.2)
ALT: 31 U/L (ref 0–53)
AST: 21 U/L (ref 0–37)
Alkaline Phosphatase: 81 U/L (ref 39–117)
BUN: 18 mg/dL (ref 6–23)
CO2: 27 mEq/L (ref 19–32)
Calcium: 9.3 mg/dL (ref 8.4–10.5)
Chloride: 103 mEq/L (ref 96–112)
Creatinine, Ser: 1.77 mg/dL — ABNORMAL HIGH (ref 0.40–1.50)
GFR: 39.92 mL/min — ABNORMAL LOW (ref >60.00)
GLUCOSE: 102 mg/dL — AB (ref 70–99)
POTASSIUM: 4.3 meq/L (ref 3.5–5.1)
SODIUM: 139 meq/L (ref 135–145)
Total Bilirubin: 1.4 mg/dL — ABNORMAL HIGH (ref 0.2–1.2)
Total Protein: 7.5 g/dL (ref 6.0–8.3)

## 2017-04-30 LAB — CBC
HEMATOCRIT: 43.6 % (ref 39.0–52.0)
HEMOGLOBIN: 15.4 g/dL (ref 13.0–17.0)
MCHC: 35.2 g/dL (ref 30.0–36.0)
MCV: 90.4 fl (ref 78.0–100.0)
Platelets: 259 10*3/uL (ref 150.0–400.0)
RBC: 4.82 Mil/uL (ref 4.22–5.81)
RDW: 13.6 % (ref 11.5–15.5)
WBC: 6.7 10*3/uL (ref 4.0–10.5)

## 2017-04-30 LAB — VITAMIN B12: VITAMIN B 12: 513 pg/mL (ref 211–911)

## 2017-04-30 MED ORDER — CYANOCOBALAMIN 1000 MCG/ML IJ SOLN: INTRAMUSCULAR | 3 refills | Status: DC

## 2017-04-30 NOTE — Assessment & Plan Note (Signed)
I have personally reviewed the Medicare Annual Wellness questionnaire and have noted 1. The patient's medical and social history 2. Their use of alcohol, tobacco or illicit drugs 3. Their current medications and supplements 4. The patient's functional ability including ADL's, fall risks, home safety risks and hearing or visual             impairment. 5. Diet and physical activities 6. Evidence for depression or mood disorders  The patients weight, height, BMI and visual acuity have been recorded in the chart I have made referrals, counseling and provided education to the patient based review of the above and I have provided the pt with a written personalized care plan for preventive services.  I have provided you with a copy of your personalized plan for preventive services. Please take the time to review along with your updated medication list.  Yearly flu vaccine Consider shingrix Pneumovax booster today Discussed resistance training FIT next year No PSA due to age

## 2017-04-30 NOTE — Addendum Note (Signed)
Addended by: Pilar Grammes on: 04/30/2017 12:46 PM   Modules accepted: Orders

## 2017-04-30 NOTE — Assessment & Plan Note (Signed)
See social history Blank forms given 

## 2017-04-30 NOTE — Assessment & Plan Note (Signed)
BP Readings from Last 3 Encounters:  04/30/17 138/86  04/29/16 140/86  04/03/15 138/70   Good control Due for labs

## 2017-04-30 NOTE — Progress Notes (Signed)
Hearing Screening   Method: Audiometry   125Hz  250Hz  500Hz  1000Hz  2000Hz  3000Hz  4000Hz  6000Hz  8000Hz   Right ear:   20 20 25   0    Left ear:   25 25 0  0    Vision Screening Comments: January 2019

## 2017-04-30 NOTE — Assessment & Plan Note (Signed)
Continues on monthly shots Today will be ~trough level

## 2017-04-30 NOTE — Progress Notes (Signed)
Subjective:    Patient ID: Darren Atkins, male    DOB: 09/12/1940, 77 y.o.   MRN: [id number]  HPI Visit for Medicare wellness and follow up of chronic health conditions Reviewed form and advanced directives Reviewed other doctors No alcohol or tobacco Vision is fine Mild hearing issues Stays active and some exercise No falls No depression or anhedonia Independent with instrumental ADLs---and still significant caregiving responsibilities for wife No sig memory problems  Ongoing sinus drainage Allergy related but continual and bad at night Gets post nasal drip Inconsistent with nasal spray--discussed taking it regularly  Having more leg problems Can't stand still--some balance issues and pain Numbness also Continues on the B12 shots  Gets aching over tibial plateaus Not exertional and not bad Discussed trying ice  No problems with BP meds No chest pain or SOB No dizziness or syncope No edema No palpitations On ACEI with known CKD--due for albs  Current Outpatient Medications on File Prior to Visit  Medication Sig Dispense Refill  . amLODipine (NORVASC) 10 MG tablet TAKE ONE TABLET BY MOUTH ONCE DAILY 90 tablet 3  . cyanocobalamin (,VITAMIN B-12,) 1000 MCG/ML injection INJECT ONE ML INTRAMUSCULARLY  EVERY 30 DAYS 3 mL 3  . docusate sodium (COLACE) 100 MG capsule Take 100 mg by mouth at bedtime as needed.      . fluticasone (FLONASE) 50 MCG/ACT nasal spray USE TWO SPRAY(S) IN EACH NOSTRIL ONCE DAILY 16 g 11  . folic acid (FOLVITE) 1 MG tablet TAKE ONE TABLET BY MOUTH ONCE DAILY 90 tablet 3  . lisinopril (PRINIVIL,ZESTRIL) 10 MG tablet Take 1 tablet (10 mg total) by mouth daily. 90 tablet 3  . Multiple Vitamin (MULTIVITAMIN) capsule Take 1 capsule by mouth daily.       No current facility-administered medications on file prior to visit.     Allergies  Allergen Reactions  . Hydrochlorothiazide W-Triamterene     REACTION: Palps  . Naprosyn [Naproxen] Other (See  Comments)    Insomnia, stomach upset  . Sulfonamide Derivatives     REACTION: unspecified    Past Medical History:  Diagnosis Date  . Allergy   . Anemia   . Hypertension   . Osteoarthritis     Past Surgical History:  Procedure Laterality Date  . [place]. Myoview- apical thinning, no ischemia EF 53% 02/08    Family History  Problem Relation Age of Onset  . Hypertension Mother   . Osteoporosis Mother   . Hypertension Father   . Coronary artery disease Neg Hx   . Diabetes Neg Hx   . Cancer Neg Hx     Social History   Socioeconomic History  . Marital status: Married    Spouse name: Not on file  . Number of children: 1  . Years of education: Not on file  . Highest education level: Not on file  Occupational History  . Occupation: Retired---- Engineer, site for Film/video editor: [employer]:    Social Needs  . Financial resource strain: Not on file  . Food insecurity:    Worry: Not on file    Inability: Not on file  . Transportation needs:    Medical: Not on file    Non-medical: Not on file  Tobacco Use  . Smoking status: Never Smoker  . Smokeless tobacco: Former Systems developer    Types: Chew  Substance and Sexual Activity  . Alcohol use: No  .  Drug use: Not on file  . Sexual activity: Not on file  Lifestyle  . Physical activity:    Days per week: Not on file    Minutes per session: Not on file  . Stress: Not on file  Relationships  . Social connections:    Talks on phone: Not on file    Gets together: Not on file    Attends religious service: Not on file    Active member of club or organization: Not on file    Attends meetings of clubs or organizations: Not on file    Relationship status: Not on file  . Intimate partner violence:    Fear of current or ex partner: Not on file    Emotionally abused: Not on file    Physically abused: Not on file    Forced sexual activity: Not on file  Other Topics Concern  . Not on file  Social  History Narrative   No living will   No health care POA-- daughter would be the one.   Would accept resuscitation attempts but no prolonged artificial ventilation   Probably would accept tube feeds         Review of Systems Only occipital headaches--- afternoon (tension) Sleep is variable---usually only 3-4 hours at a time. No daytime somnolence Appetite is fine Weight is stable Wears seat belt Teeth are okay. Full dentures No skin rash or suspicious lesions Bowels are slow--needs regular laxative. No blood Voids okay. Stream is reasonable. No regular nocturia No heartburn or dysphagia Mild low back pain when he feels his knees--no major arthritic issues     Objective:   Physical Exam  Constitutional: He is oriented to person, place, and time. He appears well-developed. No distress.  HENT:  Mouth/Throat: Oropharynx is clear and moist. No oropharyngeal exudate.  Neck: No thyromegaly present.  Cardiovascular: Normal rate, regular rhythm, normal heart sounds and intact distal pulses. Exam reveals no gallop.  No murmur heard. Pulmonary/Chest: Effort normal and breath sounds normal. No respiratory distress. He has no wheezes. He has no rales.  Abdominal: Soft. There is no tenderness.  Musculoskeletal: He exhibits no edema or tenderness.  Lymphadenopathy:    He has no cervical adenopathy.  Neurological: He is alert and oriented to person, place, and time.  President--- "Milinda Pointer, Obama, ?" [phone] D-l-r-o-w Recall 3/3  Slight decreased sensation in feet  Skin: No rash noted. No erythema.  Psychiatric: He has a normal mood and affect. His behavior is normal.          Assessment & Plan:

## 2017-04-30 NOTE — Assessment & Plan Note (Signed)
Seems to be worse Will check labs

## 2017-04-30 NOTE — Assessment & Plan Note (Signed)
Long standing On ACEI Will recheck

## 2017-05-05 LAB — PROTEIN ELECTROPHORESIS, SERUM, WITH REFLEX
Albumin ELP: 4.1 g/dL (ref 3.8–4.8)
Alpha 1: 0.3 g/dL (ref 0.2–0.3)
Alpha 2: 0.8 g/dL (ref 0.5–0.9)
BETA GLOBULIN: 0.4 g/dL (ref 0.4–0.6)
Beta 2: 0.4 g/dL (ref 0.2–0.5)
Gamma Globulin: 1.2 g/dL (ref 0.8–1.7)
Total Protein: 7.1 g/dL (ref 6.1–8.1)

## 2017-05-09 ENCOUNTER — Ambulatory Visit: Payer: Self-pay | Admitting: *Deleted

## 2017-05-09 NOTE — Telephone Encounter (Signed)
He called in c/o having numbness/tingling in both of his legs that has been going on for a couple of months now.   He mentioned it to Dr. Silvio Pate at his wellness visit last week.   He said something about me having pernicious anemia and may be related to that.   "I take B12 shots".     "I just don't feel something is right".  See triage notes.  The protocol mentions being seen within 24 hours but he wanted to wait and see Dr. Silvio Pate specifically.   Since this was not an acute issue I scheduled him with Dr. Silvio Pate on 05/14/17 at 10:45.    I went over the s/s of what to look for prior to him coming in.   Worse numbness, pain, unsteady gait, swelling in one or both legs, discoloration such as a darker color or very pale in color, to either call us back or go to the ED.   He verbalized understanding.   Reason for Disposition . Numbness in a leg or foot (i.e., loss of sensation)  Answer Assessment - Initial Assessment Questions 1. ONSET: "When did the pain start?"      I'm having numbness in both legs for a couple of months now.   I don't feel steady.  But I feel like I'm walking fine.   "I know something going on that is not supposed to be". 2. LOCATION: "Where is the pain located?"      From my knees down is more numb than the rest of my legs.  I saw Dr. Silvio Pate last week and mentioned all of this to him.   I have pernicious anemia and take B12 shots. 3. PAIN: "How bad is the pain?"    (Scale 1-10; or mild, moderate, severe)   -  MILD (1-3): doesn't interfere with normal activities    -  MODERATE (4-7): interferes with normal activities (e.g., work or school) or awakens from sleep, limping    -  SEVERE (8-10): excruciating pain, unable to do any normal activities, unable to walk     I can still drive ok.   I notice there is something there that is not supposed to be there.    It feels like my legs are asleep.   4. WORK OR EXERCISE: "Has there been any recent work or exercise that involved this part of  the body?"      No accidents or exercise. 5. CAUSE: "What do you think is causing the leg pain?"     No swelling.  Maybe circulation is bad.   I can't walk a long ways.   Standing still is hard for about 6 months- 1 year.   My legs feel like they go to sleep.   Not seen a specialist in the past for these symptoms. 6. OTHER SYMPTOMS: "Do you have any other symptoms?" (e.g., chest pain, back pain, breathing difficulty, swelling, rash, fever, numbness, weakness)     No swelling in legs.  I have lower back pain about the belt line when I'm standing for a while without walking around. 7. PREGNANCY: "Is there any chance you are pregnant?" "When was your last menstrual period?"     N/A  Protocols used: LEG PAIN-A-AH

## 2017-05-14 ENCOUNTER — Encounter: Payer: Self-pay | Admitting: Internal Medicine

## 2017-05-14 ENCOUNTER — Ambulatory Visit (INDEPENDENT_AMBULATORY_CARE_PROVIDER_SITE_OTHER): Payer: PPO | Admitting: Internal Medicine

## 2017-05-14 VITALS — BP 140/76 | HR 82 | Temp 98.2°F | Ht 71.0 in | Wt 198.0 lb

## 2017-05-14 DIAGNOSIS — G629 Polyneuropathy, unspecified: Secondary | ICD-10-CM | POA: Diagnosis not present

## 2017-05-14 MED ORDER — FOLIC ACID 1 MG PO TABS: 1.0000 mg | ORAL_TABLET | Freq: Every day | ORAL | 3 refills | Status: DC

## 2017-05-14 MED ORDER — LISINOPRIL 10 MG PO TABS: 10.0000 mg | ORAL_TABLET | Freq: Every day | ORAL | 3 refills | Status: DC

## 2017-05-14 MED ORDER — AMLODIPINE BESYLATE 10 MG PO TABS: 10.0000 mg | ORAL_TABLET | Freq: Every day | ORAL | 3 refills | Status: DC

## 2017-05-14 NOTE — Assessment & Plan Note (Signed)
All testing negative No vascular problems History now most suggestive of lumbar spinal stenosis Discussed options (check MRI, neurology ref)---he feels best with neurology evaluation May need NCV studies as well

## 2017-05-14 NOTE — Progress Notes (Signed)
Subjective:    Patient ID: Darren Atkins, male    DOB: 28-Jul-1940, 77 y.o.   MRN: [id number]  HPI Here due to progressive leg sensory changes If he stands still--he gets low back pain (and he has to move around) Also gets the leg numbness at the same time "My legs just don't work like they're supposed to" Still feels safe driving--but notices change in sensation on the pedals Back pain is not persistent  Current Outpatient Medications on File Prior to Visit  Medication Sig Dispense Refill  . amLODipine (NORVASC) 10 MG tablet TAKE ONE TABLET BY MOUTH ONCE DAILY 90 tablet 3  . cyanocobalamin (,VITAMIN B-12,) 1000 MCG/ML injection INJECT ONE ML INTRAMUSCULARLY  EVERY 30 DAYS 3 mL 3  . docusate sodium (COLACE) 100 MG capsule Take 100 mg by mouth at bedtime as needed.      . fluticasone (FLONASE) 50 MCG/ACT nasal spray USE TWO SPRAY(S) IN EACH NOSTRIL ONCE DAILY 16 g 11  . folic acid (FOLVITE) 1 MG tablet TAKE ONE TABLET BY MOUTH ONCE DAILY 90 tablet 3  . lisinopril (PRINIVIL,ZESTRIL) 10 MG tablet Take 1 tablet (10 mg total) by mouth daily. 90 tablet 3  . Multiple Vitamin (MULTIVITAMIN) capsule Take 1 capsule by mouth daily.       No current facility-administered medications on file prior to visit.     Allergies  Allergen Reactions  . Hydrochlorothiazide W-Triamterene     REACTION: Palps  . Naprosyn [Naproxen] Other (See Comments)    Insomnia, stomach upset  . Sulfonamide Derivatives     REACTION: unspecified    Past Medical History:  Diagnosis Date  . Allergy   . Anemia   . Hypertension   . Osteoarthritis     Past Surgical History:  Procedure Laterality Date  . [place]. Myoview- apical thinning, no ischemia EF 53% 02/08    Family History  Problem Relation Age of Onset  . Hypertension Mother   . Osteoporosis Mother   . Hypertension Father   . Coronary artery disease Neg Hx   . Diabetes Neg Hx   . Cancer Neg Hx     Social History    Socioeconomic History  . Marital status: Married    Spouse name: Not on file  . Number of children: 1  . Years of education: Not on file  . Highest education level: Not on file  Occupational History  . 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 Financial resource strain: Not on file  . Food insecurity:    Worry: Not on file    Inability: Not on file  . Transportation needs:    Medical: Not on file    Non-medical: Not on file  Tobacco Use  . Smoking status: Never Smoker  . Smokeless tobacco: Former Systems developer    Types: Chew  Substance and Sexual Activity  . Alcohol use: No  . Drug use: Not on file  . Sexual activity: Not on file  Lifestyle  . Physical activity:    Days per week: Not on file    Minutes per session: Not on file  . Stress: Not on file  Relationships  . Social connections:    Talks on phone: Not on file    Gets together: Not on file    Attends religious service: Not on file    Active member of  club or organization: Not on file    Attends meetings of clubs or organizations: Not on file    Relationship status: Not on file  . Intimate partner violence:    Fear of current or ex partner: Not on file    Emotionally abused: Not on file    Physically abused: Not on file    Forced sexual activity: Not on file  Other Topics Concern  . Not on file  Social History Narrative   No living will   No health care POA-- daughter would be the one.   Would accept resuscitation attempts but no prolonged artificial ventilation   Probably would accept tube feeds         Review of Systems Chronic constipation---has always needed laxative (and now he hasn't needed one lately) Sinus symptoms and drainage---got a different nasal spray and this is better now    Objective:   Physical Exam  Cardiovascular: Intact distal pulses.  Neurological: Coordination and gait normal.  Fairly normal sensation to fine touch  in feet          Assessment & Plan:

## 2017-05-27 DIAGNOSIS — M545 Low back pain, unspecified: Secondary | ICD-10-CM | POA: Insufficient documentation

## 2017-05-27 DIAGNOSIS — G8929 Other chronic pain: Secondary | ICD-10-CM | POA: Insufficient documentation

## 2017-05-27 DIAGNOSIS — M79604 Pain in right leg: Secondary | ICD-10-CM | POA: Insufficient documentation

## 2017-05-27 DIAGNOSIS — R200 Anesthesia of skin: Secondary | ICD-10-CM | POA: Insufficient documentation

## 2017-05-27 DIAGNOSIS — R202 Paresthesia of skin: Secondary | ICD-10-CM | POA: Insufficient documentation

## 2017-05-27 DIAGNOSIS — M79605 Pain in left leg: Secondary | ICD-10-CM | POA: Diagnosis not present

## 2017-12-29 ENCOUNTER — Ambulatory Visit (INDEPENDENT_AMBULATORY_CARE_PROVIDER_SITE_OTHER): Payer: PPO | Admitting: Internal Medicine

## 2017-12-29 ENCOUNTER — Encounter: Payer: Self-pay | Admitting: Internal Medicine

## 2017-12-29 VITALS — BP 140/86 | HR 72 | Temp 97.8°F | Ht 71.0 in | Wt 194.0 lb

## 2017-12-29 DIAGNOSIS — S335XXA Sprain of ligaments of lumbar spine, initial encounter: Secondary | ICD-10-CM | POA: Diagnosis not present

## 2017-12-29 DIAGNOSIS — L570 Actinic keratosis: Secondary | ICD-10-CM

## 2017-12-29 NOTE — Assessment & Plan Note (Signed)
Discussed options Verbal consent Liquid nitrogen for 40 seconds x 2 Tolerated well Discussed home care If recurs---to dermatologist for biopsy

## 2017-12-29 NOTE — Assessment & Plan Note (Signed)
Seems to be muscular Symptoms different than what he has with his spinal stenosis Discussed symptom relief

## 2017-12-29 NOTE — Progress Notes (Signed)
Subjective:    Patient ID: Darren Atkins, male    DOB: [birth year], 77 y.o.   MRN: [id number]  HPI Here with wife due to persistent lesion on forehead Goes back for months Would seem to be healing and then get worse  Tried some chap stick on it last week It got worse (reacted to this) No pain  Also having some left low back pain Known spinal stenosis Last week--got worse pain. Tried tylenol and it eased off Tried CBD oil orally--thinks that might have helped  Current Outpatient Medications on File Prior to Visit  Medication Sig Dispense Refill  . amLODipine (NORVASC) 10 MG tablet Take 1 tablet (10 mg total) by mouth daily. 90 tablet 3  . cyanocobalamin (,VITAMIN B-12,) 1000 MCG/ML injection INJECT ONE ML INTRAMUSCULARLY  EVERY 30 DAYS 3 mL 3  . docusate sodium (COLACE) 100 MG capsule Take 100 mg by mouth at bedtime as needed.      . fluticasone (FLONASE) 50 MCG/ACT nasal spray USE TWO SPRAY(S) IN EACH NOSTRIL ONCE DAILY 16 g 11  . folic acid (FOLVITE) 1 MG tablet Take 1 tablet (1 mg total) by mouth daily. 90 tablet 3  . lisinopril (PRINIVIL,ZESTRIL) 10 MG tablet Take 1 tablet (10 mg total) by mouth daily. 90 tablet 3  . Multiple Vitamin (MULTIVITAMIN) capsule Take 1 capsule by mouth daily.       No current facility-administered medications on file prior to visit.     Allergies  Allergen Reactions  . Hydrochlorothiazide W-Triamterene     REACTION: Palps  . Naprosyn [Naproxen] Other (See Comments)    Insomnia, stomach upset  . Sulfonamide Derivatives     REACTION: unspecified    Past Medical History:  Diagnosis Date  . Allergy   . Anemia   . Hypertension   . Osteoarthritis     Past Surgical History:  Procedure Laterality Date  . [place]. Myoview- apical thinning, no ischemia EF 53% 02/08    Family History  Problem Relation Age of Onset  . Hypertension Mother   . Osteoporosis Mother   . Hypertension Father   . Coronary artery disease Neg Hx     . Diabetes Neg Hx   . Cancer Neg Hx     Social History   Socioeconomic History  . Marital status: Married    Spouse name: Not on file  . Number of children: 1  . Years of education: Not on file  . Highest education level: Not on file  Occupational History  . 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 Financial resource strain: Not on file  . Food insecurity:    Worry: Not on file    Inability: Not on file  . Transportation needs:    Medical: Not on file    Non-medical: Not on file  Tobacco Use  . Smoking status: Never Smoker  . Smokeless tobacco: Former Systems developer    Types: Chew  Substance and Sexual Activity  . Alcohol use: No  . Drug use: Not on file  . Sexual activity: Not on file  Lifestyle  . Physical activity:    Days per week: Not on file    Minutes per session: Not on file  . Stress: Not on file  Relationships  . Social connections:    Talks on phone: Not on file    Gets together:  Not on file    Attends religious service: Not on file    Active member of club or organization: Not on file    Attends meetings of clubs or organizations: Not on file    Relationship status: Not on file  . Intimate partner violence:    Fear of current or ex partner: Not on file    Emotionally abused: Not on file    Physically abused: Not on file    Forced sexual activity: Not on file  Other Topics Concern  . Not on file  Social History Narrative   No living will   No health care POA-- daughter would be the one.   Would accept resuscitation attempts but no prolonged artificial ventilation   Probably would accept tube feeds         Review of Systems  No other suspicious lesions No fever Not sick     Objective:   Physical Exam  Constitutional: He appears well-developed. No distress.  Musculoskeletal:  Mild tenderness along left lumbar paraspinals No spine tenderness SLR negative  Neurological:  Normal  gait No weakness  Skin:  2 x 52mm actinic on mid forehead. Some swelling around it (from the chapstick?)           Assessment & Plan:

## 2018-03-02 DIAGNOSIS — H25813 Combined forms of age-related cataract, bilateral: Secondary | ICD-10-CM | POA: Diagnosis not present

## 2018-03-02 DIAGNOSIS — H52223 Regular astigmatism, bilateral: Secondary | ICD-10-CM | POA: Diagnosis not present

## 2018-03-02 DIAGNOSIS — H527 Unspecified disorder of refraction: Secondary | ICD-10-CM | POA: Diagnosis not present

## 2018-04-13 ENCOUNTER — Other Ambulatory Visit: Payer: Self-pay | Admitting: Internal Medicine

## 2018-05-13 ENCOUNTER — Other Ambulatory Visit: Payer: Self-pay | Admitting: Internal Medicine

## 2018-05-25 ENCOUNTER — Other Ambulatory Visit: Payer: Self-pay | Admitting: Internal Medicine

## 2018-06-02 ENCOUNTER — Encounter: Payer: PPO | Admitting: Internal Medicine

## 2018-07-09 ENCOUNTER — Other Ambulatory Visit: Payer: Self-pay | Admitting: Internal Medicine

## 2018-07-13 DIAGNOSIS — M545 Low back pain: Secondary | ICD-10-CM | POA: Diagnosis not present

## 2018-07-13 DIAGNOSIS — Z882 Allergy status to sulfonamides status: Secondary | ICD-10-CM | POA: Diagnosis not present

## 2018-07-13 DIAGNOSIS — I10 Essential (primary) hypertension: Secondary | ICD-10-CM | POA: Diagnosis not present

## 2018-07-13 DIAGNOSIS — R110 Nausea: Secondary | ICD-10-CM | POA: Diagnosis not present

## 2018-07-13 DIAGNOSIS — Z79899 Other long term (current) drug therapy: Secondary | ICD-10-CM | POA: Diagnosis not present

## 2018-07-13 DIAGNOSIS — R1013 Epigastric pain: Secondary | ICD-10-CM | POA: Diagnosis not present

## 2018-07-16 ENCOUNTER — Encounter: Payer: Self-pay | Admitting: Internal Medicine

## 2018-07-16 ENCOUNTER — Other Ambulatory Visit: Payer: Self-pay

## 2018-07-16 ENCOUNTER — Ambulatory Visit (INDEPENDENT_AMBULATORY_CARE_PROVIDER_SITE_OTHER): Payer: Medicare Other | Admitting: Internal Medicine

## 2018-07-16 VITALS — BP 112/80 | HR 74 | Temp 98.3°F | Ht 71.0 in | Wt 196.0 lb

## 2018-07-16 DIAGNOSIS — R1033 Periumbilical pain: Secondary | ICD-10-CM

## 2018-07-16 DIAGNOSIS — R109 Unspecified abdominal pain: Secondary | ICD-10-CM | POA: Insufficient documentation

## 2018-07-16 DIAGNOSIS — N183 Chronic kidney disease, stage 3 unspecified: Secondary | ICD-10-CM

## 2018-07-16 NOTE — Assessment & Plan Note (Signed)
Atypical location but otherwise consistent with acid related Bad spell after sleeping in back brace Episodic evening/night nausea spells that alka seltzer helps Doesn't sound like gallbladder, pancreatitis, gastritis, diverticulitis Continue the omeprazole Labs okay at the ER Will just check FIT---looking back, his colon in 2010 showed adenomatous polyp and he hasn't had repeat

## 2018-07-16 NOTE — Progress Notes (Signed)
Subjective:    Patient ID: Darren Atkins, male    DOB: [birth year], 78 y.o.   MRN: [id number]  HPI  Here due to abdominal pain Started with low back pain about 2 weeks ago--by belt line Doesn't remember any injury Started taking regular tylenol  Then awoke 3 days ago with bad abdominal pain (hard to pinpoint but he points towards the umbilicus) Went to ER at [facility] some tests---got x-rays of his back but no imaging of abdomen Lumbar spondylosis and aortic atherosclerosis found on x-ray Told to take omeprazole Creatinine was actually better--at 1.4  "Stomach is still not right" but it is better Will get evening nausea at times ---alka seltzer does help Will even awaken with nausea at times also---alka seltzer helps then too No dysphagia Bowels are unchanged----always takes dulcolax every night (for as many years as he can remember)  Current Outpatient Medications on File Prior to Visit  Medication Sig Dispense Refill  . amLODipine (NORVASC) 10 MG tablet Take 1 tablet (10 mg total) by mouth daily. NEEDS OFFICE VISIT 90 tablet 0  . cyanocobalamin (,VITAMIN B-12,) 1000 MCG/ML injection INJECT 1 ML INTRAMUSCULARLY ONCE EVERY 30 DAYS 3 mL 0  . docusate sodium (COLACE) 100 MG capsule Take 100 mg by mouth at bedtime as needed.      . fluticasone (FLONASE) 50 MCG/ACT nasal spray USE TWO SPRAY(S) IN EACH NOSTRIL ONCE DAILY 16 g 11  . folic acid (FOLVITE) 1 MG tablet Take 1 tablet by mouth daily 90 tablet 0  . lisinopril (PRINIVIL,ZESTRIL) 10 MG tablet Take 1 tablet by mouth daily 90 tablet 0  . Multiple Vitamin (MULTIVITAMIN) capsule Take 1 capsule by mouth daily.      Marland Kitchen omeprazole (PRILOSEC) 40 MG capsule      No current facility-administered medications on file prior to visit.     Allergies  Allergen Reactions  . Hydrochlorothiazide W-Triamterene     REACTION: Palps  . Naprosyn [Naproxen] Other (See Comments)    Insomnia, stomach upset  . Sulfonamide Derivatives    REACTION: unspecified    Past Medical History:  Diagnosis Date  . Allergy   . Anemia   . Hypertension   . Osteoarthritis     Past Surgical History:  Procedure Laterality Date  . [place]. Myoview- apical thinning, no ischemia EF 53% 02/08    Family History  Problem Relation Age of Onset  . Hypertension Mother   . Osteoporosis Mother   . Hypertension Father   . Coronary artery disease Neg Hx   . Diabetes Neg Hx   . Cancer Neg Hx     Social History   Socioeconomic History  . Marital status: Married    Spouse name: Not on file  . Number of children: 1  . Years of education: Not on file  . Highest education level: Not on file  Occupational History  . 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 Financial resource strain: Not on file  . Food insecurity    Worry: Not on file    Inability: Not on file  . Transportation needs    Medical: Not on file    Non-medical: Not on file  Tobacco Use  . Smoking status: Never Smoker  . Smokeless tobacco: Former Systems developer    Types: Chew  Substance and Sexual Activity  . Alcohol use: No  .  Drug use: Not on file  . Sexual activity: Not on file  Lifestyle  . Physical activity    Days per week: Not on file    Minutes per session: Not on file  . Stress: Not on file  Relationships  . Social Herbalist on phone: Not on file    Gets together: Not on file    Attends religious service: Not on file    Active member of club or organization: Not on file    Attends meetings of clubs or organizations: Not on file    Relationship status: Not on file  . Intimate partner violence    Fear of current or ex partner: Not on file    Emotionally abused: Not on file    Physically abused: Not on file    Forced sexual activity: Not on file  Other Topics Concern  . Not on file  Social History Narrative   No living will   No health care POA-- daughter would  be the one.   Would accept resuscitation attempts but no prolonged artificial ventilation   Probably would accept tube feeds         Review of Systems Appetite is fine Weight is stable No fever No post prandial pain--but some bloating Got a back brace---wore it to bed the night before he went to the ER    Objective:   Physical Exam  Constitutional: He appears well-developed. No distress.  Neck: No thyromegaly present.  Cardiovascular: Normal rate, regular rhythm and normal heart sounds. Exam reveals no gallop.  No murmur heard. Respiratory: Effort normal and breath sounds normal. No respiratory distress. He has no wheezes. He has no rales.  GI: Soft. Bowel sounds are normal. He exhibits no distension. There is no abdominal tenderness. There is no rebound and no guarding.  Musculoskeletal:        General: No edema.  Lymphadenopathy:    He has no cervical adenopathy.           Assessment & Plan:

## 2018-07-16 NOTE — Assessment & Plan Note (Signed)
Creatinine improved on recent check in ER

## 2018-07-21 ENCOUNTER — Telehealth: Payer: Self-pay | Admitting: Internal Medicine

## 2018-07-21 ENCOUNTER — Other Ambulatory Visit (INDEPENDENT_AMBULATORY_CARE_PROVIDER_SITE_OTHER): Payer: Medicare Other

## 2018-07-21 DIAGNOSIS — R1033 Periumbilical pain: Secondary | ICD-10-CM | POA: Diagnosis not present

## 2018-07-21 DIAGNOSIS — R109 Unspecified abdominal pain: Secondary | ICD-10-CM

## 2018-07-21 LAB — FECAL OCCULT BLOOD, IMMUNOCHEMICAL: Fecal Occult Bld: NEGATIVE

## 2018-07-21 NOTE — Telephone Encounter (Signed)
Best number [phone]  Pt called to let you know the problem (stomach pain)  he say you about last week is better but he thinks he needs to have more testing

## 2018-07-21 NOTE — Telephone Encounter (Signed)
Left message to call office

## 2018-07-21 NOTE — Telephone Encounter (Signed)
If he is not better, my plan was to set him up with GI. Find out if this is okay

## 2018-07-21 NOTE — Telephone Encounter (Signed)
Spoke to pt. He said it seems to have gotten better but still has pain and bloating. Does not know if it is GI or Back related.

## 2018-07-22 NOTE — Telephone Encounter (Signed)
Best number [phone] Pt called back he would like to have a GI appointment  Pt would like to go to [facility]

## 2018-07-23 NOTE — Telephone Encounter (Signed)
Referral placed.

## 2018-07-29 ENCOUNTER — Other Ambulatory Visit: Payer: Self-pay

## 2018-07-29 ENCOUNTER — Ambulatory Visit: Payer: Medicare Other | Admitting: Gastroenterology

## 2018-07-29 VITALS — BP 149/87 | HR 82 | Temp 98.0°F | Wt 191.8 lb

## 2018-07-29 DIAGNOSIS — Z86010 Personal history of colonic polyps: Secondary | ICD-10-CM | POA: Diagnosis not present

## 2018-07-29 DIAGNOSIS — K5900 Constipation, unspecified: Secondary | ICD-10-CM | POA: Diagnosis not present

## 2018-07-29 NOTE — Progress Notes (Signed)
Darren Atkins [facility]  [place], [place] [address]  Main: [phone]  Fax: [phone]   Gastroenterology Consultation  Referring Provider:     Venia Carbon, MD Primary Care Physician:  Venia Carbon, MD Reason for Consultation:     Abdominal pain        HPI:    Chief Complaint  Patient presents with  . New Patient (Initial Visit)    abdominal pain    Darren Atkins is a 78 y.o. y/o male referred for consultation & management  by Dr. Venia Carbon, MD.  Patient reports going to the ER about a month ago with abdominal pain.  He reports this started occurring 2 days after he slept with a back brace on for his back pain.  States they did an x-ray of his back in the ER and said that he has arthritis.  And he was also given omeprazole which she states has helped.  However, he states his pain was in the bilateral lower quadrant and that is where he usually experiences abdominal pain.  Denies any upper abdominal pain whatsoever.  States his pain is always below his umbilicus.  Reports chronic history of constipation "for 70 years".  States he always takes a stool softener.  Thinks it is over-the-counter Dulcolax that he is currently taking.  He takes this once every night.  With which she has 1 bowel movement every day.  No blood in stool.  No nausea or vomiting.  Pain has since resolved and has not reoccurred in the last 3 to 4 weeks.  Patient had a colonoscopy in 2010 and 2 subcentimeter polyps were removed and showed tubular adenoma.  Repeat was recommended in 5 years.  Patient denies any heartburn.  However, does report that if he eats too fast he feels that the food slows down in his esophagus but it eventually passes.  Describes having an esophagram years ago and states he was told he has a restricted area.  However has never had an upper endoscopy.  No family history of colon cancer.  Past Medical History:  Diagnosis Date  . Allergy   .  Anemia   . Hypertension   . Osteoarthritis     Past Surgical History:  Procedure Laterality Date  . [place]. Myoview- apical thinning, no ischemia EF 53% 02/08    Prior to Admission medications   Medication Sig Start Date End Date Taking? Authorizing Provider  cyanocobalamin (,VITAMIN B-12,) 1000 MCG/ML injection INJECT 1 ML INTRAMUSCULARLY ONCE EVERY 30 DAYS 05/25/18  Yes Venia Carbon, MD  docusate sodium (COLACE) 100 MG capsule Take 100 mg by mouth at bedtime as needed.     Yes [provider]  folic acid (FOLVITE) 1 MG tablet Take 1 tablet by mouth daily 05/13/18  Yes Venia Carbon, MD  lisinopril (PRINIVIL,ZESTRIL) 10 MG tablet Take 1 tablet by mouth daily 05/13/18  Yes Venia Carbon, MD  omeprazole (PRILOSEC) 40 MG capsule  07/13/18  Yes [provider]  fluticasone (FLONASE) 50 MCG/ACT nasal spray USE TWO SPRAY(S) IN EACH NOSTRIL ONCE DAILY Patient not taking: Reported on 07/29/2018 05/11/13   Venia Carbon, MD    Family History  Problem Relation Age of Onset  . Hypertension Mother   . Osteoporosis Mother   . Hypertension Father   . Coronary artery disease Neg Hx   . Diabetes Neg Hx   .  Cancer Neg Hx      Social History   Tobacco Use  . Smoking status: Never Smoker  . Smokeless tobacco: Former Systems developer    Types: Chew  Substance Use Topics  . Alcohol use: No  . Drug use: Not on file    Allergies as of 07/29/2018 - Review Complete 07/29/2018  Allergen Reaction Noted  . Dapsone Other (See Comments) 05/27/2017  . Hydrochlorothiazide Other (See Comments) 05/27/2017  . Hydrochlorothiazide w-triamterene  07/13/2018  . Naprosyn [naproxen] Other (See Comments) 09/18/2010  . Sulfa antibiotics  04/11/2006  . Sulfonamide derivatives  04/11/2006    Review of Systems:    All systems reviewed and negative except where noted in HPI.   Physical Exam:  BP (!) 149/87   Pulse 82   Temp 98 F (36.7 C) (Oral)   Wt 191 lb 12.8 oz (87  kg)   BMI 26.75 kg/m  No LMP for male patient. Psych:  Alert and cooperative. Normal mood and affect. General:   Alert,  Well-developed, well-nourished, pleasant and cooperative in NAD Head:  Normocephalic and atraumatic. Eyes:  Sclera clear, no icterus.   Conjunctiva pink. Ears:  Normal auditory acuity. Nose:  No deformity, discharge, or lesions. Mouth:  No deformity or lesions,oropharynx pink & moist. Neck:  Supple; no masses or thyromegaly. Abdomen:  Normal bowel sounds.  No bruits.  Soft, non-tender and non-distended without masses, hepatosplenomegaly or hernias noted.  No guarding or rebound tenderness.    Msk:  Symmetrical without gross deformities. Good, equal movement & strength bilaterally. Pulses:  Normal pulses noted. Extremities:  No clubbing or edema.  No cyanosis. Neurologic:  Alert and oriented x3;  grossly normal neurologically. Skin:  Intact without significant lesions or rashes. No jaundice. Lymph Nodes:  No significant cervical adenopathy. Psych:  Alert and cooperative. Normal mood and affect.   Labs: June 8 labs reviewed in care everywhere and show reassuring liver enzymes.  No anemia.  Normal lipase.  Imaging Studies: No results found.  Assessment and Plan:   Darren Atkins is a 78 y.o. y/o male has been referred for abdominal pain  Abdominal pain occurred about a month ago and has since resolved It occurred after 2 days of sleeping and back brace Unclear if he had exacerbated constipation at that time However, physical exam today and other work-up so far has been reassuring  He states with daily Dulcolax he has regular bowel movements.  Unwilling to try MiraLAX instead.  States this is the only thing that has worked and he is tried multiple other medications in the past and does not want to change what he is on now.  I did discuss with him that he is past due for his follow-up surveillance colonoscopy and he is willing to schedule it at this time.  I also  recommended doing an EGD given his complaints of food slowing down if he eats too fast, and his verbal report that he was told he has restricted esophagus after a barium esophagram.  However, he adamantly refuses getting this study and understands the risk of underlying strictures or malignancy if an upper endoscopy is not done.  He states as long as he eats slowly and is conscientious of not swallowing too fast he does not have the issue and thus does not want EGD.  I have discussed alternative options, risks & benefits,  which include, but are not limited to, bleeding, infection, perforation,respiratory complication & drug reaction.  The patient agrees with this  plan & written consent will be obtained.    He can discontinue the omeprazole after he finishes the current prescribed dosage by primary care provider, as he is not having any acid reflux, nausea or vomiting, or any other upper GI symptoms.   Dr Darren Atkins  Speech recognition software was used to dictate the above note.

## 2018-07-30 ENCOUNTER — Other Ambulatory Visit: Payer: Self-pay

## 2018-07-30 DIAGNOSIS — Z86010 Personal history of colonic polyps: Secondary | ICD-10-CM

## 2018-07-31 ENCOUNTER — Other Ambulatory Visit
Admission: RE | Admit: 2018-07-31 | Discharge: 2018-07-31 | Disposition: A | Discharge status: Home / Self Care | Payer: Medicare Other | Source: Ambulatory Visit | Attending: Gastroenterology | Admitting: Gastroenterology

## 2018-07-31 ENCOUNTER — Other Ambulatory Visit: Payer: Self-pay

## 2018-07-31 DIAGNOSIS — Z1159 Encounter for screening for other viral diseases: Secondary | ICD-10-CM | POA: Diagnosis not present

## 2018-08-01 LAB — NOVEL CORONAVIRUS, NAA (HOSP ORDER, SEND-OUT TO REF LAB; TAT 18-24 HRS): SARS-CoV-2, NAA: NOT DETECTED

## 2018-08-04 ENCOUNTER — Encounter: Payer: Self-pay | Admitting: Emergency Medicine

## 2018-08-05 ENCOUNTER — Ambulatory Visit: Payer: Medicare Other | Admitting: Anesthesiology

## 2018-08-05 ENCOUNTER — Ambulatory Visit
Admission: RE | Admit: 2018-08-05 | Discharge: 2018-08-05 | Disposition: A | Discharge status: Home / Self Care | Payer: Medicare Other | Attending: Gastroenterology | Admitting: Gastroenterology

## 2018-08-05 ENCOUNTER — Encounter: Payer: Self-pay | Admitting: *Deleted

## 2018-08-05 ENCOUNTER — Encounter
Admission: RE | Disposition: A | Discharge status: Home / Self Care | Payer: Self-pay | Source: Home / Self Care | Attending: Gastroenterology

## 2018-08-05 DIAGNOSIS — K5730 Diverticulosis of large intestine without perforation or abscess without bleeding: Secondary | ICD-10-CM | POA: Diagnosis not present

## 2018-08-05 DIAGNOSIS — Z860100 Personal history of colon polyps, unspecified: Secondary | ICD-10-CM

## 2018-08-05 DIAGNOSIS — K6289 Other specified diseases of anus and rectum: Secondary | ICD-10-CM

## 2018-08-05 DIAGNOSIS — Z79899 Other long term (current) drug therapy: Secondary | ICD-10-CM | POA: Diagnosis not present

## 2018-08-05 DIAGNOSIS — M1990 Unspecified osteoarthritis, unspecified site: Secondary | ICD-10-CM | POA: Diagnosis not present

## 2018-08-05 DIAGNOSIS — K635 Polyp of colon: Secondary | ICD-10-CM

## 2018-08-05 DIAGNOSIS — D120 Benign neoplasm of cecum: Secondary | ICD-10-CM | POA: Insufficient documentation

## 2018-08-05 DIAGNOSIS — Z1211 Encounter for screening for malignant neoplasm of colon: Secondary | ICD-10-CM | POA: Diagnosis not present

## 2018-08-05 DIAGNOSIS — K5790 Diverticulosis of intestine, part unspecified, without perforation or abscess without bleeding: Secondary | ICD-10-CM | POA: Diagnosis not present

## 2018-08-05 DIAGNOSIS — D123 Benign neoplasm of transverse colon: Secondary | ICD-10-CM | POA: Diagnosis not present

## 2018-08-05 DIAGNOSIS — I10 Essential (primary) hypertension: Secondary | ICD-10-CM | POA: Diagnosis not present

## 2018-08-05 DIAGNOSIS — D122 Benign neoplasm of ascending colon: Secondary | ICD-10-CM | POA: Insufficient documentation

## 2018-08-05 DIAGNOSIS — I129 Hypertensive chronic kidney disease with stage 1 through stage 4 chronic kidney disease, or unspecified chronic kidney disease: Secondary | ICD-10-CM | POA: Diagnosis not present

## 2018-08-05 HISTORY — PX: COLONOSCOPY WITH PROPOFOL: SHX5780

## 2018-08-05 SURGERY — COLONOSCOPY WITH PROPOFOL
Anesthesia: General

## 2018-08-05 MED ORDER — SODIUM CHLORIDE 0.9 % IV SOLN
INTRAVENOUS | Status: DC
  Administered 2018-08-05 (×3): via INTRAVENOUS

## 2018-08-05 MED ORDER — PROPOFOL 500 MG/50ML IV EMUL
INTRAVENOUS | Status: DC | PRN
  Administered 2018-08-05: 180 ug/kg/min via INTRAVENOUS

## 2018-08-05 MED ORDER — PROPOFOL 500 MG/50ML IV EMUL
INTRAVENOUS | Status: AC
  Filled 2018-08-05: qty 50

## 2018-08-05 MED ORDER — EPHEDRINE SULFATE 50 MG/ML IJ SOLN
INTRAMUSCULAR | Status: DC | PRN
  Administered 2018-08-05: 10 mg via INTRAVENOUS

## 2018-08-05 MED ORDER — PROPOFOL 10 MG/ML IV BOLUS
INTRAVENOUS | Status: DC | PRN
  Administered 2018-08-05: 40 mg via INTRAVENOUS

## 2018-08-05 MED ORDER — PHENYLEPHRINE HCL (PRESSORS) 10 MG/ML IV SOLN
INTRAVENOUS | Status: DC | PRN
  Administered 2018-08-05: 100 ug via INTRAVENOUS

## 2018-08-05 NOTE — Transfer of Care (Signed)
Immediate Anesthesia Transfer of Care Note  Patient: Darren Atkins  Procedure(s) Performed: COLONOSCOPY WITH PROPOFOL (N/A )  Patient Location: PACU  Anesthesia Type:General  Level of Consciousness: sedated  Airway & Oxygen Therapy: Patient Spontanous Breathing and Patient connected to nasal cannula oxygen  Post-op Assessment: Report given to RN and Post -op Vital signs reviewed and stable  Post vital signs: Reviewed and stable  Last Vitals:  Vitals Value Taken Time  BP 96/53 08/05/18 1216  Temp 36.2 C 08/05/18 1216  Pulse 71 08/05/18 1218  Resp 16 08/05/18 1218  SpO2 99 % 08/05/18 1218  Vitals shown include unvalidated device data.  Last Pain:  Vitals:   08/05/18 1216  TempSrc: Tympanic  PainSc: Asleep         Complications: No apparent anesthesia complications

## 2018-08-05 NOTE — H&P (Signed)
Vonda Antigua, MD [address] [place], [place], [place], [address] [address], [place], [place], [place], [address] Phone: [phone]  Fax: [phone]  Primary Care Physician:  Venia Carbon, MD   Pre-Procedure History & Physical: HPI:  Darren Atkins is a 78 y.o. male is here for a colonoscopy.   Past Medical History:  Diagnosis Date  . Allergy   . Anemia   . Hypertension   . Osteoarthritis     Past Surgical History:  Procedure Laterality Date  . [place]. Myoview- apical thinning, no ischemia EF 53% 02/08    Prior to Admission medications   Medication Sig Start Date End Date Taking? Authorizing Provider  amLODipine (NORVASC) 10 MG tablet Take 10 mg by mouth daily.   Yes [provider]  cyanocobalamin (,VITAMIN B-12,) 1000 MCG/ML injection INJECT 1 ML INTRAMUSCULARLY ONCE EVERY 30 DAYS 05/25/18  Yes Venia Carbon, MD  folic acid (FOLVITE) 1 MG tablet Take 1 tablet by mouth daily 05/13/18  Yes Venia Carbon, MD  lisinopril (PRINIVIL,ZESTRIL) 10 MG tablet Take 1 tablet by mouth daily 05/13/18  Yes Venia Carbon, MD  omeprazole (PRILOSEC) 40 MG capsule  07/13/18  Yes [provider]  docusate sodium (COLACE) 100 MG capsule Take 100 mg by mouth at bedtime as needed.      [provider]  fluticasone (FLONASE) 50 MCG/ACT nasal spray USE TWO SPRAY(S) IN EACH NOSTRIL ONCE DAILY Patient not taking: Reported on 07/29/2018 05/11/13   Venia Carbon, MD    Allergies as of 07/30/2018 - Review Complete 07/29/2018  Allergen Reaction Noted  . Dapsone Other (See Comments) 05/27/2017  . Hydrochlorothiazide Other (See Comments) 05/27/2017  . Hydrochlorothiazide w-triamterene  07/13/2018  . Naprosyn [naproxen] Other (See Comments) 09/18/2010  . Sulfa antibiotics  04/11/2006  . Sulfonamide derivatives  04/11/2006    Family History  Problem Relation Age of Onset  . Hypertension Mother   . Osteoporosis Mother   .  Hypertension Father   . Coronary artery disease Neg Hx   . Diabetes Neg Hx   . Cancer Neg Hx     Social History   Socioeconomic History  . Marital status: Married    Spouse name: Not on file  . Number of children: 1  . Years of education: Not on file  . Highest education level: Not on file  Occupational History  . Occupation: Retired---- Engineer, site for Film/video editor: [employer]    Social Needs  . Financial resource strain: Not on file  . Food insecurity    Worry: Not on file    Inability: Not on file  . Transportation needs    Medical: Not on file    Non-medical: Not on file  Tobacco Use  . Smoking status: Never Smoker  . Smokeless tobacco: Former Systems developer    Types: Chew  Substance and Sexual Activity  . Alcohol use: No  . Drug use: Not Currently  . Sexual activity: Not on file  Lifestyle  . Physical activity    Days per week: Not on file    Minutes per session: Not on file  . Stress: Not on file  Relationships  . Social Herbalist on phone: Not on file    Gets together: Not on file    Attends religious service: Not on file    Active member of club or organization: Not  on file    Attends meetings of clubs or organizations: Not on file    Relationship status: Not on file  . Intimate partner violence    Fear of current or ex partner: Not on file    Emotionally abused: Not on file    Physically abused: Not on file    Forced sexual activity: Not on file  Other Topics Concern  . Not on file  Social History Narrative   No living will   No health care POA-- daughter would be the one.   Would accept resuscitation attempts but no prolonged artificial ventilation   Probably would accept tube feeds          Review of Systems: See HPI, otherwise negative ROS  Physical Exam: BP (!) 145/93   Pulse 89   Temp (!) 96.4 F (35.8 C) (Tympanic)   Resp 18   Ht 6' (1.829 m)   Wt 86.6 kg   SpO2 97%   BMI 25.90 kg/m  General:   Alert,   pleasant and cooperative in NAD Head:  Normocephalic and atraumatic. Neck:  Supple; no masses or thyromegaly. Lungs:  Clear throughout to auscultation, normal respiratory effort.    Heart:  +S1, +S2, Regular rate and rhythm, No edema. Abdomen:  Soft, nontender and nondistended. Normal bowel sounds, without guarding, and without rebound.   Neurologic:  Alert and  oriented x4;  grossly normal neurologically.  Impression/Plan: Darren Atkins is here for a colonoscopy to be performed for polyp surveillance  Risks, benefits, limitations, and alternatives regarding  colonoscopy have been reviewed with the patient.  Questions have been answered.  All parties agreeable.   Virgel Manifold, MD  08/05/2018, 10:27 AM

## 2018-08-05 NOTE — Op Note (Signed)
[facility] Gastroenterology Patient Name: Darren Atkins Procedure Date: 08/05/2018 11:20 AM MRN: [id number] Account #: [id number] Date of Birth: 06-13-1940 Admit Type: Outpatient Age: 78 Room: [facility] ENDO ROOM 1 Gender: Male Note Status: Finalized Procedure:            Colonoscopy Indications:          High risk colon cancer surveillance: Personal history                        of colonic polyps Providers:            Katera Rybka B. Bonna Gains MD, MD Referring MD:         Venia Carbon (Referring MD) Medicines:            Monitored Anesthesia Care Complications:        No immediate complications. Procedure:            Pre-Anesthesia Assessment:                       - ASA Grade Assessment: II - A patient with mild                        systemic disease.                       - Prior to the procedure, a History and Physical was                        performed, and patient medications, allergies and                        sensitivities were reviewed. The patient's tolerance of                        previous anesthesia was reviewed.                       - The risks and benefits of the procedure and the                        sedation options and risks were discussed with the                        patient. All questions were answered and informed                        consent was obtained.                       - Patient identification and proposed procedure were                        verified prior to the procedure by the physician, the                        nurse, the anesthesiologist, the anesthetist and the                        technician. The procedure was verified in the procedure  room.                       After obtaining informed consent, the colonoscope was                        passed under direct vision. Throughout the procedure,                        the patient's blood pressure, pulse, and oxygen                         saturations were monitored continuously. The                        Colonoscope was introduced through the anus and                        advanced to the the cecum, identified by appendiceal                        orifice and ileocecal valve. The colonoscopy was                        performed with ease. The patient tolerated the                        procedure well. The quality of the bowel preparation                        was good. Findings:      The perianal and digital rectal examinations were normal.      Two sessile polyps were found in the ascending colon and cecum. The       polyps were 2 to 3 mm in size. These polyps were removed with a cold       biopsy forceps. Resection and retrieval were complete.      Two sessile polyps were found in the transverse colon. The polyps were 4       to 5 mm in size. These polyps were removed with a cold snare. Resection       and retrieval were complete.      Multiple diverticula were found in the sigmoid colon and ascending       colon. A few diverticuli were proximal to the sigmoid colon. Most of       them were in the sigmoid colon.      The exam was otherwise without abnormality.      The rectum, sigmoid colon, descending colon, transverse colon, ascending       colon and cecum appeared normal.      The retroflexed view of the distal rectum and anal verge was normal and       showed no anal or rectal abnormalities.      Anal papilla(e) were hypertrophied. Impression:           - Two 2 to 3 mm polyps in the ascending colon and in                        the cecum, removed with a cold biopsy forceps. Resected  and retrieved.                       - Two 4 to 5 mm polyps in the transverse colon, removed                        with a cold snare. Resected and retrieved.                       - Diverticulosis in the sigmoid colon and in the                        ascending colon.                       - The examination  was otherwise normal.                       - The rectum, sigmoid colon, descending colon,                        transverse colon, ascending colon and cecum are normal.                       - The distal rectum and anal verge are normal on                        retroflexion view.                       - Anal papilla(e) were hypertrophied. Recommendation:       - Discharge patient to home (with escort).                       - High fiber diet.                       - Advance diet as tolerated.                       - Continue present medications.                       - Await pathology results.                       - Repeat colonoscopy in 5 years.                       - The findings and recommendations were discussed with                        the patient.                       - The findings and recommendations were discussed with                        the patient's family.                       - Return to primary care physician as previously  scheduled. Procedure Code(s):    --- Professional ---                       [phone], Colonoscopy, flexible; with removal of tumor(s),                        polyp(s), or other lesion(s) by snare technique                       45380, 8, Colonoscopy, flexible; with biopsy, single                        or multiple Diagnosis Code(s):    --- Professional ---                       Z86.010, Personal history of colonic polyps                       K63.5, Polyp of colon                       K62.89, Other specified diseases of anus and rectum                       K57.30, Diverticulosis of large intestine without                        perforation or abscess without bleeding CPT copyright 2019 American Medical Association. All rights reserved. The codes documented in this report are preliminary and upon coder review may  be revised to meet current compliance requirements.  Vonda Antigua, MD Margretta Sidle B. Bonna Gains MD,  MD 08/05/2018 12:25:21 PM This report has been signed electronically. Number of Addenda: 0 Note Initiated On: 08/05/2018 11:20 AM Scope Withdrawal Time: 0 hours 22 minutes 57 seconds  Total Procedure Duration: 0 hours 31 minutes 29 seconds  Estimated Blood Loss: Estimated blood loss: none.      [facility]

## 2018-08-05 NOTE — Anesthesia Procedure Notes (Signed)
Date/Time: 08/05/2018 11:35 AM Performed by: Allean Found, CRNA Pre-anesthesia Checklist: Patient identified, Emergency Drugs available, Suction available, Patient being monitored and Timeout performed Patient Re-evaluated:Patient Re-evaluated prior to induction Oxygen Delivery Method: Nasal cannula Placement Confirmation: positive ETCO2

## 2018-08-05 NOTE — Anesthesia Postprocedure Evaluation (Signed)
Anesthesia Post Note  Patient: Darren Atkins  Procedure(s) Performed: COLONOSCOPY WITH PROPOFOL (N/A )  Patient location during evaluation: PACU Anesthesia Type: General Level of consciousness: awake and alert Pain management: pain level controlled Vital Signs Assessment: post-procedure vital signs reviewed and stable Respiratory status: spontaneous breathing, nonlabored ventilation and respiratory function stable Cardiovascular status: blood pressure returned to baseline and stable Postop Assessment: no apparent nausea or vomiting Anesthetic complications: no     Last Vitals:  Vitals:   08/05/18 1236 08/05/18 1246  BP: (!) 89/55 116/67  Pulse: 63 65  Resp: 15 14  Temp:    SpO2: 100% 97%    Last Pain:  Vitals:   08/05/18 1246  TempSrc:   PainSc: 0-No pain                 Durenda Hurt

## 2018-08-05 NOTE — Anesthesia Preprocedure Evaluation (Signed)
Anesthesia Evaluation  Patient identified by MRN, date of birth, ID band Patient awake    Reviewed: Allergy & Precautions, NPO status , Patient's Chart, lab work & pertinent test results  History of Anesthesia Complications Negative for: history of anesthetic complications  Airway Mallampati: II       Dental  (+) Lower Dentures, Upper Dentures   Pulmonary neg sleep apnea, neg COPD,           Cardiovascular hypertension, Pt. on medications (-) Past MI and (-) CHF (-) dysrhythmias (-) Valvular Problems/Murmurs     Neuro/Psych neg Seizures    GI/Hepatic Neg liver ROS, neg GERD  ,  Endo/Other  neg diabetes  Renal/GU Renal disease     Musculoskeletal   Abdominal   Peds  Hematology   Anesthesia Other Findings   Reproductive/Obstetrics                             Anesthesia Physical Anesthesia Plan  ASA: III  Anesthesia Plan: General   Post-op Pain Management:    Induction:   PONV Risk Score and Plan: 2 and Propofol infusion and TIVA  Airway Management Planned: Nasal Cannula  Additional Equipment:   Intra-op Plan:   Post-operative Plan:   Informed Consent: I have reviewed the patients History and Physical, chart, labs and discussed the procedure including the risks, benefits and alternatives for the proposed anesthesia with the patient or authorized representative who has indicated his/her understanding and acceptance.       Plan Discussed with:   Anesthesia Plan Comments:         Anesthesia Quick Evaluation

## 2018-08-05 NOTE — Anesthesia Post-op Follow-up Note (Signed)
Anesthesia QCDR form completed.        

## 2018-08-06 ENCOUNTER — Encounter: Payer: Self-pay | Admitting: Gastroenterology

## 2018-08-06 LAB — SURGICAL PATHOLOGY

## 2018-08-10 ENCOUNTER — Other Ambulatory Visit: Payer: Self-pay | Admitting: Internal Medicine

## 2018-08-11 ENCOUNTER — Other Ambulatory Visit: Payer: Self-pay

## 2018-08-11 ENCOUNTER — Encounter: Payer: Self-pay | Admitting: Gastroenterology

## 2018-08-11 ENCOUNTER — Ambulatory Visit (INDEPENDENT_AMBULATORY_CARE_PROVIDER_SITE_OTHER): Payer: Medicare Other | Admitting: Internal Medicine

## 2018-08-11 ENCOUNTER — Encounter: Payer: Self-pay | Admitting: Internal Medicine

## 2018-08-11 DIAGNOSIS — L600 Ingrowing nail: Secondary | ICD-10-CM | POA: Diagnosis not present

## 2018-08-11 MED ORDER — OMEPRAZOLE 20 MG PO CPDR: 20.0000 mg | DELAYED_RELEASE_CAPSULE | Freq: Every day | ORAL | 3 refills | Status: DC

## 2018-08-11 NOTE — Assessment & Plan Note (Signed)
Distal only With nail elevator---distal lateral portion of nail mobilized then cut down bandaid Discussed home care

## 2018-08-11 NOTE — Progress Notes (Signed)
Subjective:    Patient ID: Darren Atkins, male    DOB: 09/19/1940, 78 y.o.   MRN: [id number]  HPI Here due to apparent ingrown toenail  Past problems with right great toe--debrided by podiatrist Now with pain in left great toenail---towards the 2nd toe Some pain Doesn't seem infected No drainage  Current Outpatient Medications on File Prior to Visit  Medication Sig Dispense Refill  . amLODipine (NORVASC) 10 MG tablet Take 10 mg by mouth daily.    . cyanocobalamin (,VITAMIN B-12,) 1000 MCG/ML injection INJECT 1 ML INTRAMUSCULARLY ONCE EVERY 30 DAYS 3 mL 0  . docusate sodium (COLACE) 100 MG capsule Take 100 mg by mouth at bedtime as needed.      . fluticasone (FLONASE) 50 MCG/ACT nasal spray USE TWO SPRAY(S) IN EACH NOSTRIL ONCE DAILY 16 g 11  . folic acid (FOLVITE) 1 MG tablet Take 1 tablet by mouth daily 90 tablet 0  . lisinopril (ZESTRIL) 10 MG tablet Take 1 tablet by mouth once daily 90 tablet 0  . omeprazole (PRILOSEC) 40 MG capsule      No current facility-administered medications on file prior to visit.     Allergies  Allergen Reactions  . Dapsone Other (See Comments)  . Hydrochlorothiazide Other (See Comments)  . Hydrochlorothiazide W-Triamterene     REACTION: Palps REACTION: Palps  . Naprosyn [Naproxen] Other (See Comments)    Insomnia, stomach upset  . Sulfa Antibiotics     REACTION: unspecified  . Sulfonamide Derivatives     REACTION: unspecified    Past Medical History:  Diagnosis Date  . Allergy   . Anemia   . Hypertension   . Osteoarthritis     Past Surgical History:  Procedure Laterality Date  . COLONOSCOPY WITH PROPOFOL N/A 08/05/2018   Procedure: COLONOSCOPY WITH PROPOFOL;  Surgeon: Virgel Manifold, MD;  Location: [facility] ENDOSCOPY;  Service: Endoscopy;  Laterality: N/A;  . [place]. Myoview- apical thinning, no ischemia EF 53% 02/08    Family History  Problem Relation Age of Onset  . Hypertension Mother   . Osteoporosis  Mother   . Hypertension Father   . Coronary artery disease Neg Hx   . Diabetes Neg Hx   . Cancer Neg Hx     Social History   Socioeconomic History  . Marital status: Married    Spouse name: Not on file  . Number of children: 1  . Years of education: Not on file  . Highest education level: Not on file  Occupational History  . 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 Financial resource strain: Not on file  . Food insecurity    Worry: Not on file    Inability: Not on file  . Transportation needs    Medical: Not on file    Non-medical: Not on file  Tobacco Use  . Smoking status: Never Smoker  . Smokeless tobacco: Former Systems developer    Types: Chew  Substance and Sexual Activity  . Alcohol use: No  . Drug use: Not Currently  . Sexual activity: Not on file  Lifestyle  . Physical activity    Days per week: Not on file    Minutes per session: Not on file  . Stress: Not on file  Relationships  . Social Herbalist on phone: Not on file    Gets together:  Not on file    Attends religious service: Not on file    Active member of club or organization: Not on file    Attends meetings of clubs or organizations: Not on file    Relationship status: Not on file  . Intimate partner violence    Fear of current or ex partner: Not on file    Emotionally abused: Not on file    Physically abused: Not on file    Forced sexual activity: Not on file  Other Topics Concern  . Not on file  Social History Narrative   No living will   No health care POA-- daughter would be the one.   Would accept resuscitation attempts but no prolonged artificial ventilation   Probably would accept tube feeds         Review of Systems  No fever Feels well otherwise     Objective:   Physical Exam  Constitutional: No distress.  Skin:  Mild distal ingrowing of lateral left great toenail Mild redness and slight tenderness  Towards nail bed looks normal           Assessment & Plan:

## 2018-08-18 ENCOUNTER — Telehealth: Payer: Self-pay

## 2018-08-18 NOTE — Telephone Encounter (Signed)
Pt wanted to know for his appt on 08/24/18 how long should pt fast for labs. I advised 4 hrs prior to appt pt could eat breakfast and then fast with water or black coffee until appt. Pt voiced understanding and nothing further needed.

## 2018-08-20 ENCOUNTER — Encounter

## 2018-08-20 ENCOUNTER — Ambulatory Visit: Payer: Medicare Other | Admitting: Gastroenterology

## 2018-08-21 ENCOUNTER — Other Ambulatory Visit: Payer: Self-pay | Admitting: Internal Medicine

## 2018-08-24 ENCOUNTER — Ambulatory Visit (INDEPENDENT_AMBULATORY_CARE_PROVIDER_SITE_OTHER): Payer: Medicare Other | Admitting: Internal Medicine

## 2018-08-24 ENCOUNTER — Other Ambulatory Visit: Payer: Self-pay

## 2018-08-24 ENCOUNTER — Encounter: Payer: Self-pay | Admitting: Internal Medicine

## 2018-08-24 VITALS — BP 138/86 | HR 66 | Temp 98.3°F | Ht 71.0 in | Wt 189.0 lb

## 2018-08-24 DIAGNOSIS — D510 Vitamin B12 deficiency anemia due to intrinsic factor deficiency: Secondary | ICD-10-CM

## 2018-08-24 DIAGNOSIS — N183 Chronic kidney disease, stage 3 unspecified: Secondary | ICD-10-CM

## 2018-08-24 DIAGNOSIS — I10 Essential (primary) hypertension: Secondary | ICD-10-CM | POA: Diagnosis not present

## 2018-08-24 DIAGNOSIS — S335XXD Sprain of ligaments of lumbar spine, subsequent encounter: Secondary | ICD-10-CM

## 2018-08-24 DIAGNOSIS — Z7189 Other specified counseling: Secondary | ICD-10-CM

## 2018-08-24 DIAGNOSIS — Z0000 Encounter for general adult medical examination without abnormal findings: Secondary | ICD-10-CM | POA: Diagnosis not present

## 2018-08-24 LAB — HEPATIC FUNCTION PANEL
ALT: 26 U/L (ref 0–53)
AST: 15 U/L (ref 0–37)
Albumin: 4.1 g/dL (ref 3.5–5.2)
Alkaline Phosphatase: 79 U/L (ref 39–117)
Bilirubin, Direct: 0.1 mg/dL (ref 0.0–0.3)
Total Bilirubin: 0.9 mg/dL (ref 0.2–1.2)
Total Protein: 6.5 g/dL (ref 6.0–8.3)

## 2018-08-24 LAB — CBC
HCT: 41.5 % (ref 39.0–52.0)
Hemoglobin: 14.1 g/dL (ref 13.0–17.0)
MCHC: 33.9 g/dL (ref 30.0–36.0)
MCV: 92.1 fl (ref 78.0–100.0)
Platelets: 220 10*3/uL (ref 150.0–400.0)
RBC: 4.51 Mil/uL (ref 4.22–5.81)
RDW: 13.2 % (ref 11.5–15.5)
WBC: 5.5 10*3/uL (ref 4.0–10.5)

## 2018-08-24 LAB — RENAL FUNCTION PANEL
Albumin: 4.1 g/dL (ref 3.5–5.2)
BUN: 19 mg/dL (ref 6–23)
CO2: 26 mEq/L (ref 19–32)
Calcium: 8.6 mg/dL (ref 8.4–10.5)
Chloride: 106 mEq/L (ref 96–112)
Creatinine, Ser: 1.58 mg/dL — ABNORMAL HIGH (ref 0.40–1.50)
GFR: 42.67 mL/min — ABNORMAL LOW (ref >60.00)
Glucose, Bld: 90 mg/dL (ref 70–99)
Phosphorus: 3.2 mg/dL (ref 2.3–4.6)
Potassium: 4.4 mEq/L (ref 3.5–5.1)
Sodium: 140 mEq/L (ref 135–145)

## 2018-08-24 LAB — VITAMIN D 25 HYDROXY (VIT D DEFICIENCY, FRACTURES): VITD: 20.47 ng/mL — ABNORMAL LOW (ref 30.00–100.00)

## 2018-08-24 NOTE — Assessment & Plan Note (Signed)
Has been stable for some years Will refer if any worsening Check PTH, vitamin D On ACEI

## 2018-08-24 NOTE — Progress Notes (Signed)
Hearing Screening   125Hz  250Hz  500Hz  1000Hz  2000Hz  3000Hz  4000Hz  6000Hz  8000Hz   Right ear:   20 20 20   0    Left ear:   20 20 0  0    Vision Screening Comments: August 2019

## 2018-08-24 NOTE — Progress Notes (Signed)
Subjective:    Patient ID: Darren Atkins, male    DOB: March 22, 1940, 78 y.o.   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 No problems with hearing No falls No depression or anhedonia Independent with instrumental ADLs  Stomach is better Had the ER visit due to pain Now symptoms have resolved No heartburn or dysphagia  Has been awakening at 2AM with some pain along left side of spine in lumbar area Occasionally sore during the day No spine tenderness  No chest pain No palpitations No dizziness or syncope No edema No cough or SOB  He gives himself B12 injection monthly No sensory changes  Known CKD 3 Has been stable for some years  Current Outpatient Medications on File Prior to Visit  Medication Sig Dispense Refill  . amLODipine (NORVASC) 10 MG tablet Take 10 mg by mouth daily.    . cyanocobalamin (,VITAMIN B-12,) 1000 MCG/ML injection INJECT 1 ML INTRAMUSCULARLY ONCE EVERY 30 DAYS 3 mL 0  . docusate sodium (COLACE) 100 MG capsule Take 100 mg by mouth at bedtime as needed.      . fluticasone (FLONASE) 50 MCG/ACT nasal spray USE TWO SPRAY(S) IN EACH NOSTRIL ONCE DAILY 16 g 11  . folic acid (FOLVITE) 1 MG tablet Take 1 tablet by mouth once daily 90 tablet 3  . lisinopril (ZESTRIL) 10 MG tablet Take 1 tablet by mouth once daily 90 tablet 0  . omeprazole (PRILOSEC) 20 MG capsule Take 1 capsule (20 mg total) by mouth daily. 90 capsule 3   No current facility-administered medications on file prior to visit.     Allergies  Allergen Reactions  . Dapsone Other (See Comments)  . Hydrochlorothiazide Other (See Comments)  . Hydrochlorothiazide W-Triamterene     REACTION: Palps REACTION:  Palps  . Naprosyn [Naproxen] Other (See Comments)    Insomnia, stomach upset  . Sulfa Antibiotics     REACTION: unspecified  . Sulfonamide Derivatives     REACTION: unspecified    Past Medical History:  Diagnosis Date  . Allergy   . Anemia   . Hypertension   . Osteoarthritis     Past Surgical History:  Procedure Laterality Date  . COLONOSCOPY WITH PROPOFOL N/A 08/05/2018   Procedure: COLONOSCOPY WITH PROPOFOL;  Surgeon: Virgel Manifold, MD;  Location: [facility] END[facility]COPY;  Service: Endoscopy;  Laterality: N/A;  . [place]. Myoview- apical thinning, no ischemia EF 53% 02/08    Family History  Problem Relation Age of Onset  . Hypertension Mother   . Osteoporosis Mother   . Hypertension Father   . Coronary artery disease Neg Hx   . Diabetes Neg Hx   . Cancer Neg Hx     Social History   Socioeconomic History  . Marital status: Married    Spouse name: Not on file  . Number of children: 1  . Years of education: Not on file  . Highest education level: Not on file  Occupational History  . 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 Financial resource strain: Not on file  . Food insecurity  Worry: Not on file    Inability: Not on file  . Transportation needs    Medical: Not on file    Non-medical: Not on file  Tobacco Use  . Smoking status: Never Smoker  . Smokeless tobacco: Former Systems developer    Types: Chew  Substance and Sexual Activity  . Alcohol use: No  . Drug use: Not Currently  . Sexual activity: Not on file  Lifestyle  . Physical activity    Days per week: Not on file    Minutes per session: Not on file  . Stress: Not on file  Relationships  . Social Herbalist on phone: Not on file    Gets together: Not on file    Attends religious service: Not on file    Active member of club or organization: Not on file    Attends meetings of clubs or organizations: Not on file     Relationship status: Not on file  . Intimate partner violence    Fear of current or ex partner: Not on file    Emotionally abused: Not on file    Physically abused: Not on file    Forced sexual activity: Not on file  Other Topics Concern  . Not on file  Social History Narrative   No living will   No health care POA-- daughter would be the one.   Would accept resuscitation attempts but no prolonged artificial ventilation   Probably would accept tube feeds         Review of Systems Appetite is good Weight stable Wears seat belt Teeth okay---full dentures No rash or suspicious skin lesions ?bone spur at MCP of right 4th finger. Doesn't limit movement but he feels it No other arthritis problems Bowels are the same---chronic laxative use    Objective:   Physical Exam  Constitutional: He is oriented to person, place, and time. He appears well-developed. No distress.  HENT:  Mouth/Throat: Oropharynx is clear and moist. No oropharyngeal exudate.  Neck: No thyromegaly present.  Cardiovascular: Normal rate, regular rhythm, normal heart sounds and intact distal pulses. Exam reveals no gallop.  No murmur heard. Respiratory: Effort normal and breath sounds normal. No respiratory distress. He has no wheezes. He has no rales.  GI: Soft. There is no abdominal tenderness.  Musculoskeletal:        General: No tenderness or edema.     Comments: Pain area in lin left lateral paraspinals  Lymphadenopathy:    He has no cervical adenopathy.  Neurological: He is alert and oriented to person, place, and time.  President--- "Daisy Floro, Obama, Bill [name]---?" [phone] [id number] Recall 3/3  Skin: No rash noted. No erythema.  Psychiatric: He has a normal mood and affect. His behavior is normal.           Assessment & Plan:

## 2018-08-24 NOTE — Assessment & Plan Note (Signed)
In paraspinals Discussed heat at night

## 2018-08-24 NOTE — Assessment & Plan Note (Signed)
BP Readings from Last 3 Encounters:  08/24/18 138/86  08/11/18 110/74  08/05/18 116/67   Good control Due for labs

## 2018-08-24 NOTE — Assessment & Plan Note (Signed)
I have personally reviewed the Medicare Annual Wellness questionnaire and have noted 1. The patient's medical and social history 2. Their use of alcohol, tobacco or illicit drugs 3. Their current medications and supplements 4. The patient's functional ability including ADL's, fall risks, home safety risks and hearing or visual             impairment. 5. Diet and physical activities 6. Evidence for depression or mood disorders  The patients weight, height, BMI and visual acuity have been recorded in the chart I have made referrals, counseling and provided education to the patient based review of the above and I have provided the pt with a written personalized care plan for preventive services.  I have provided you with a copy of your personalized plan for preventive services. Please take the time to review along with your updated medication list.  Flu vaccine in the fall Done with screening colons---just had one No PSA due to age Discussed fitness---exercise

## 2018-08-24 NOTE — Assessment & Plan Note (Signed)
See social history 

## 2018-08-24 NOTE — Assessment & Plan Note (Signed)
Gets vitamin B12 monthly

## 2018-08-25 LAB — PARATHYROID HORMONE, INTACT (NO CA): PTH: 37 pg/mL (ref 14–64)

## 2018-08-25 LAB — VITAMIN B12: Vitamin B-12: 513 pg/mL (ref 211–911)

## 2018-09-23 ENCOUNTER — Other Ambulatory Visit: Payer: Self-pay

## 2018-09-23 ENCOUNTER — Ambulatory Visit (INDEPENDENT_AMBULATORY_CARE_PROVIDER_SITE_OTHER): Payer: Medicare Other | Admitting: Internal Medicine

## 2018-09-23 ENCOUNTER — Encounter: Payer: Self-pay | Admitting: Internal Medicine

## 2018-09-23 DIAGNOSIS — L600 Ingrowing nail: Secondary | ICD-10-CM | POA: Diagnosis not present

## 2018-09-23 NOTE — Progress Notes (Signed)
Subjective:    Patient ID: Darren Atkins, male    DOB: 1940-09-08, 78 y.o.   MRN: [id number]  HPI Here due to ongoing ingrown toenail issues Since last visit--most days it hurts with every step Even hurts with touching the other toe  Current Outpatient Medications on File Prior to Visit  Medication Sig Dispense Refill  . amLODipine (NORVASC) 10 MG tablet Take 10 mg by mouth daily.    . cyanocobalamin (,VITAMIN B-12,) 1000 MCG/ML injection INJECT 1 ML INTRAMUSCULARLY ONCE EVERY 30 DAYS 3 mL 0  . docusate sodium (COLACE) 100 MG capsule Take 100 mg by mouth at bedtime as needed.      . fluticasone (FLONASE) 50 MCG/ACT nasal spray USE TWO SPRAY(S) IN EACH NOSTRIL ONCE DAILY 16 g 11  . folic acid (FOLVITE) 1 MG tablet Take 1 tablet by mouth once daily 90 tablet 3  . lisinopril (ZESTRIL) 10 MG tablet Take 1 tablet by mouth once daily 90 tablet 0  . omeprazole (PRILOSEC) 20 MG capsule Take 1 capsule (20 mg total) by mouth daily. 90 capsule 3   No current facility-administered medications on file prior to visit.     Allergies  Allergen Reactions  . Dapsone Other (See Comments)  . Hydrochlorothiazide Other (See Comments)  . Hydrochlorothiazide W-Triamterene     REACTION: Palps REACTION: Palps  . Naprosyn [Naproxen] Other (See Comments)    Insomnia, stomach upset  . Sulfa Antibiotics     REACTION: unspecified  . Sulfonamide Derivatives     REACTION: unspecified    Past Medical History:  Diagnosis Date  . Allergy   . Anemia   . Hypertension   . Osteoarthritis     Past Surgical History:  Procedure Laterality Date  . COLONOSCOPY WITH PROPOFOL N/A 08/05/2018   Procedure: COLONOSCOPY WITH PROPOFOL;  Surgeon: Virgel Manifold, MD;  Location: [facility] END[facility]COPY;  Service: Endoscopy;  Laterality: N/A;  . [place]. Myoview- apical thinning, no ischemia EF 53% 02/08    Family History  Problem Relation Age of Onset  . Hypertension Mother   . Osteoporosis Mother   .  Hypertension Father   . Coronary artery disease Neg Hx   . Diabetes Neg Hx   . Cancer Neg Hx     Social History   Socioeconomic History  . Marital status: Married    Spouse name: Not on file  . Number of children: 1  . Years of education: Not on file  . Highest education level: Not on file  Occupational History  . 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 Financial resource strain: Not on file  . Food insecurity    Worry: Not on file    Inability: Not on file  . Transportation needs    Medical: Not on file    Non-medical: Not on file  Tobacco Use  . Smoking status: Never Smoker  . Smokeless tobacco: Former Systems developer    Types: Chew  Substance and Sexual Activity  . Alcohol use: No  . Drug use: Not Currently  . Sexual activity: Not on file  Lifestyle  . Physical activity    Days per week: Not on file    Minutes per session: Not on file  . Stress: Not on file  Relationships  . Social Herbalist on phone: Not on file    Gets  together: Not on file    Attends religious service: Not on file    Active member of club or organization: Not on file    Attends meetings of clubs or organizations: Not on file    Relationship status: Not on file  . Intimate partner violence    Fear of current or ex partner: Not on file    Emotionally abused: Not on file    Physically abused: Not on file    Forced sexual activity: Not on file  Other Topics Concern  . Not on file  Social History Narrative   No living will   No health care POA-- daughter would be the one.   Would accept resuscitation attempts but no prolonged artificial ventilation   Probably would accept tube feeds         Review of Systems     Objective:   Physical Exam  Musculoskeletal:     Comments: Tenderness and mild swelling mid lateral nail--right great toe           Assessment & Plan:

## 2018-09-23 NOTE — Assessment & Plan Note (Signed)
Discussed options Verbal consent  Sterile prep to lateral border and base of toe Digital block with 9cc 2% plain lidocaine Wedge resection done 15 minutes later----substantial area of ingrowing nail all the way down at the nail bed was found Tolerated well Pressure dressing Discussed home care

## 2018-10-06 ENCOUNTER — Telehealth: Payer: Self-pay | Admitting: *Deleted

## 2018-10-06 MED ORDER — OMEPRAZOLE 40 MG PO CPDR: 40.0000 mg | DELAYED_RELEASE_CAPSULE | Freq: Every day | ORAL | 3 refills | Status: DC

## 2018-10-06 NOTE — Telephone Encounter (Signed)
Spoke to pt

## 2018-10-06 NOTE — Telephone Encounter (Signed)
Patient called stating that he was on Omeprazole 40 mg that the ER doctor prescribed for him. Patient stated that Dr. Silvio Pate reduced it to 20 mg. Patient stated that the lower dose was not doing much for his symptoms, so he doubled up on the 20 mg to equal 40. Patient stated that he has run out of the prescription because he doubled up on it and the pharmacy will not refill it telling him it is too soon. Patient stated that he has been out of the medication for 3-4 days and the symptoms have returned. Patient wants to know if the dose can be increased back to 40 mg and new script sent to the pharmacy? [facility]

## 2018-10-06 NOTE — Telephone Encounter (Signed)
Let him know that I sent the Rx

## 2018-10-07 ENCOUNTER — Other Ambulatory Visit: Payer: Self-pay | Admitting: Internal Medicine

## 2018-10-18 ENCOUNTER — Other Ambulatory Visit: Payer: Self-pay | Admitting: Internal Medicine

## 2018-11-09 ENCOUNTER — Other Ambulatory Visit: Payer: Self-pay | Admitting: Internal Medicine

## 2018-11-11 ENCOUNTER — Other Ambulatory Visit: Payer: Self-pay

## 2018-11-11 ENCOUNTER — Ambulatory Visit (INDEPENDENT_AMBULATORY_CARE_PROVIDER_SITE_OTHER): Payer: Medicare Other | Admitting: Internal Medicine

## 2018-11-11 ENCOUNTER — Encounter: Payer: Self-pay | Admitting: Internal Medicine

## 2018-11-11 DIAGNOSIS — L600 Ingrowing nail: Secondary | ICD-10-CM | POA: Diagnosis not present

## 2018-11-11 NOTE — Progress Notes (Signed)
Subjective:    Patient ID: Darren Atkins, male    DOB: 06/22/1940, 78 y.o.   MRN: [id number]  HPI Here due to recurrent toe pain  Started with pain again 2 days ago--at night Looks red No discharge  Current Outpatient Medications on File Prior to Visit  Medication Sig Dispense Refill  . amLODipine (NORVASC) 10 MG tablet TAKE 1 TABLET BY MOUTH ONCE DAILY . APPOINTMENT REQUIRED FOR FUTURE REFILLS 90 tablet 3  . cyanocobalamin (,VITAMIN B-12,) 1000 MCG/ML injection INJECT 1ML INTRAMUSCULARLY ONCE EVERY 30 DAYS 3 mL 3  . docusate sodium (COLACE) 100 MG capsule Take 100 mg by mouth at bedtime as needed.      . fluticasone (FLONASE) 50 MCG/ACT nasal spray USE TWO SPRAY(S) IN EACH NOSTRIL ONCE DAILY 16 g 11  . folic acid (FOLVITE) 1 MG tablet Take 1 tablet by mouth once daily 90 tablet 3  . lisinopril (ZESTRIL) 10 MG tablet Take 1 tablet by mouth once daily 90 tablet 3  . omeprazole (PRILOSEC) 40 MG capsule Take 1 capsule (40 mg total) by mouth daily. 90 capsule 3   No current facility-administered medications on file prior to visit.     Allergies  Allergen Reactions  . Dapsone Other (See Comments)  . Hydrochlorothiazide Other (See Comments)  . Hydrochlorothiazide W-Triamterene     REACTION: Palps REACTION: Palps  . Naprosyn [Naproxen] Other (See Comments)    Insomnia, stomach upset  . Sulfa Antibiotics     REACTION: unspecified  . Sulfonamide Derivatives     REACTION: unspecified    Past Medical History:  Diagnosis Date  . Allergy   . Anemia   . Hypertension   . Osteoarthritis     Past Surgical History:  Procedure Laterality Date  . COLONOSCOPY WITH PROPOFOL N/A 08/05/2018   Procedure: COLONOSCOPY WITH PROPOFOL;  Surgeon: Virgel Manifold, MD;  Location: [facility] END[facility]COPY;  Service: Endoscopy;  Laterality: N/A;  . [place]. Myoview- apical thinning, no ischemia EF 53% 02/08    Family History  Problem Relation Age of Onset  . Hypertension Mother   .  Osteoporosis Mother   . Hypertension Father   . Coronary artery disease Neg Hx   . Diabetes Neg Hx   . Cancer Neg Hx     Social History   Socioeconomic History  . Marital status: Married    Spouse name: Not on file  . Number of children: 1  . Years of education: Not on file  . Highest education level: Not on file  Occupational History  . 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 Financial resource strain: Not on file  . Food insecurity    Worry: Not on file    Inability: Not on file  . Transportation needs    Medical: Not on file    Non-medical: Not on file  Tobacco Use  . Smoking status: Never Smoker  . Smokeless tobacco: Former Systems developer    Types: Chew  Substance and Sexual Activity  . Alcohol use: No  . Drug use: Not Currently  . Sexual activity: Not on file  Lifestyle  . Physical activity    Days per week: Not on file    Minutes per session: Not on file  . Stress: Not on file  Relationships  . Social connections    Talks on phone: Not on file  Gets together: Not on file    Attends religious service: Not on file    Active member of club or organization: Not on file    Attends meetings of clubs or organizations: Not on file    Relationship status: Not on file  . Intimate partner violence    Fear of current or ex partner: Not on file    Emotionally abused: Not on file    Physically abused: Not on file    Forced sexual activity: Not on file  Other Topics Concern  . Not on file  Social History Narrative   No living will   No health care POA-- daughter would be the one.   Would accept resuscitation attempts but no prolonged artificial ventilation   Probably would accept tube feeds         Review of Systems  No fever No new shoes---not tight along his toes     Objective:   Physical Exam  Constitutional: He appears well-developed. No distress.  Musculoskeletal:     Comments: Nail growing  out again along lateral left great toe Tenderness lateral to nail bed but no redness or inflammation now           Assessment & Plan:

## 2018-11-11 NOTE — Assessment & Plan Note (Signed)
Has grown out again and some pain--though the exam is not worrisome Probably time for podiatrist--has seen Dr Elvina Mattes in past

## 2018-11-11 NOTE — Patient Instructions (Signed)
Please call Dr Elvina Mattes for an appointment (or [facility] on [address]). Let me know if you need a referral

## 2018-11-23 DIAGNOSIS — M79672 Pain in left foot: Secondary | ICD-10-CM | POA: Diagnosis not present

## 2018-11-23 DIAGNOSIS — L600 Ingrowing nail: Secondary | ICD-10-CM | POA: Diagnosis not present

## 2018-12-07 DIAGNOSIS — L600 Ingrowing nail: Secondary | ICD-10-CM | POA: Diagnosis not present

## 2018-12-07 DIAGNOSIS — M79672 Pain in left foot: Secondary | ICD-10-CM | POA: Diagnosis not present

## 2019-03-04 ENCOUNTER — Encounter: Payer: Self-pay | Admitting: Internal Medicine

## 2019-03-04 ENCOUNTER — Other Ambulatory Visit: Payer: Self-pay

## 2019-03-04 ENCOUNTER — Ambulatory Visit (INDEPENDENT_AMBULATORY_CARE_PROVIDER_SITE_OTHER): Payer: Medicare Other | Admitting: Internal Medicine

## 2019-03-04 DIAGNOSIS — L570 Actinic keratosis: Secondary | ICD-10-CM | POA: Diagnosis not present

## 2019-03-04 NOTE — Assessment & Plan Note (Signed)
Discussed options Verbal consent Cryotherapy with liquid nitrogen 30 seconds x 2 for each lesion Tolerated well Discussed home care Dermatology if they recur

## 2019-03-04 NOTE — Progress Notes (Signed)
Subjective:    Patient ID: Darren Atkins, male    DOB: 04/03/1940, 79 y.o.   MRN: [id number]  HPI Here due to skin problems This visit occurred during the SARS-CoV-2 public health emergency.  Safety protocols were in place, including screening questions prior to the visit, additional usage of staff PPE, and extensive cleaning of exam room while observing appropriate contact time as indicated for disinfecting solutions.   Has some red areas on his face Red and don't heal right Goes back a few weeks Hasn't tried any treatments  Current Outpatient Medications on File Prior to Visit  Medication Sig Dispense Refill  . amLODipine (NORVASC) 10 MG tablet TAKE 1 TABLET BY MOUTH ONCE DAILY . APPOINTMENT REQUIRED FOR FUTURE REFILLS 90 tablet 3  . cyanocobalamin (,VITAMIN B-12,) 1000 MCG/ML injection INJECT 1ML INTRAMUSCULARLY ONCE EVERY 30 DAYS 3 mL 3  . docusate sodium (COLACE) 100 MG capsule Take 100 mg by mouth at bedtime as needed.      . fluticasone (FLONASE) 50 MCG/ACT nasal spray USE TWO SPRAY(S) IN EACH NOSTRIL ONCE DAILY 16 g 11  . folic acid (FOLVITE) 1 MG tablet Take 1 tablet by mouth once daily 90 tablet 3  . lisinopril (ZESTRIL) 10 MG tablet Take 1 tablet by mouth once daily 90 tablet 3  . omeprazole (PRILOSEC) 40 MG capsule Take 1 capsule (40 mg total) by mouth daily. 90 capsule 3   No current facility-administered medications on file prior to visit.    Allergies  Allergen Reactions  . Dapsone Other (See Comments)  . Hydrochlorothiazide Other (See Comments)  . Hydrochlorothiazide W-Triamterene     REACTION: Palps REACTION: Palps  . Naprosyn [Naproxen] Other (See Comments)    Insomnia, stomach upset  . Sulfa Antibiotics     REACTION: unspecified  . Sulfonamide Derivatives     REACTION: unspecified    Past Medical History:  Diagnosis Date  . Allergy   . Anemia   . Hypertension   . Osteoarthritis     Past Surgical History:  Procedure Laterality Date  .  COLONOSCOPY WITH PROPOFOL N/A 08/05/2018   Procedure: COLONOSCOPY WITH PROPOFOL;  Surgeon: Virgel Manifold, MD;  Location: [facility] ENDOSCOPY;  Service: Endoscopy;  Laterality: N/A;  . [place]. Myoview- apical thinning, no ischemia EF 53% 02/08    Family History  Problem Relation Age of Onset  . Hypertension Mother   . Osteoporosis Mother   . Hypertension Father   . Coronary artery disease Neg Hx   . Diabetes Neg Hx   . Cancer Neg Hx     Social History   Socioeconomic History  . Marital status: Widowed    Spouse name: Not on file  . Number of children: 1  . Years of education: Not on file  . Highest education level: Not on file  Occupational History  . Occupation: Retired---- Engineer, site for Film/video editor: RETIRED    Comment:    Tobacco Use  . Smoking status: Never Smoker  . Smokeless tobacco: Former Systems developer    Types: Chew  Substance and Sexual Activity  . Alcohol use: No  . Drug use: Not Currently  . Sexual activity: Not on file  Other Topics Concern  . Not on file  Social History Narrative   Widowed 2020      No living will   No health care POA-- daughter would be the one.   Would accept resuscitation attempts  but no prolonged artificial ventilation   Probably would accept tube feeds         Social Determinants of Health   Financial Resource Strain:   . Difficulty of Paying Living Expenses: Not on file  Food Insecurity:   . Worried About Charity fundraiser in the Last Year: Not on file  . Ran Out of Food in the Last Year: Not on file  Transportation Needs:   . Lack of Transportation (Medical): Not on file  . Lack of Transportation (Non-Medical): Not on file  Physical Activity:   . Days of Exercise per Week: Not on file  . Minutes of Exercise per Session: Not on file  Stress:   . Feeling of Stress : Not on file  Social Connections:   . Frequency of Communication with Friends and Family: Not on file  . Frequency of Social  Gatherings with Friends and Family: Not on file  . Attends Religious Services: Not on file  . Active Member of Clubs or Organizations: Not on file  . Attends Archivist Meetings: Not on file  . Marital Status: Not on file  Intimate Partner Violence:   . Fear of Current or Ex-Partner: Not on file  . Emotionally Abused: Not on file  . Physically Abused: Not on file  . Sexually Abused: Not on file   Review of Systems  No new meds or products No fever Not sick     Objective:   Physical Exam  Skin:  3 actinics on face---1 at angle of right jaw, 1 at left nasal border and 1 on left upper cheek 2 on left side are 2-74mm Lesion at jaw is 58mm           Assessment & Plan:

## 2019-03-15 ENCOUNTER — Encounter: Payer: Self-pay | Admitting: Internal Medicine

## 2019-03-15 ENCOUNTER — Other Ambulatory Visit: Payer: Self-pay

## 2019-03-15 ENCOUNTER — Ambulatory Visit (INDEPENDENT_AMBULATORY_CARE_PROVIDER_SITE_OTHER): Payer: Medicare Other | Admitting: Internal Medicine

## 2019-03-15 DIAGNOSIS — M545 Low back pain: Secondary | ICD-10-CM

## 2019-03-15 DIAGNOSIS — R1084 Generalized abdominal pain: Secondary | ICD-10-CM | POA: Diagnosis not present

## 2019-03-15 DIAGNOSIS — G8929 Other chronic pain: Secondary | ICD-10-CM

## 2019-03-15 MED ORDER — OMEPRAZOLE 40 MG PO CPDR: 40.0000 mg | DELAYED_RELEASE_CAPSULE | Freq: Two times a day (BID) | ORAL | 3 refills | Status: DC

## 2019-03-15 NOTE — Assessment & Plan Note (Signed)
May be mild recurrence of lumbar spinal stenosis vs sprain Better now Observation only

## 2019-03-15 NOTE — Progress Notes (Signed)
Subjective:    Patient ID: Darren Atkins, male    DOB: 07/14/1940, 79 y.o.   MRN: [id number]  HPI Here due to abdominal pain This visit occurred during the SARS-CoV-2 public health emergency.  Safety protocols were in place, including screening questions prior to the visit, additional usage of staff PPE, and extensive cleaning of exam room while observing appropriate contact time as indicated for disinfecting solutions.   Had sudden feeling of upset stomach for 3-4 days last week Gnawing sensation Like "almost like I was going to throw up" Appetite off for 2-3 days Usually later in the afternoon--not related to eating Points more to periumbilical area Increased the omeprazole to bid for a few days and he seems to be better now  Had had some back problems some months ago Did get better Then 2 days ago---started again with low back pain (around belt). More on right than left No radiation No legs This seems better Trouble with standing for too long  Current Outpatient Medications on File Prior to Visit  Medication Sig Dispense Refill  . amLODipine (NORVASC) 10 MG tablet TAKE 1 TABLET BY MOUTH ONCE DAILY . APPOINTMENT REQUIRED FOR FUTURE REFILLS 90 tablet 3  . cyanocobalamin (,VITAMIN B-12,) 1000 MCG/ML injection INJECT 1ML INTRAMUSCULARLY ONCE EVERY 30 DAYS 3 mL 3  . docusate sodium (COLACE) 100 MG capsule Take 100 mg by mouth at bedtime as needed.      . fluticasone (FLONASE) 50 MCG/ACT nasal spray USE TWO SPRAY(S) IN EACH NOSTRIL ONCE DAILY 16 g 11  . folic acid (FOLVITE) 1 MG tablet Take 1 tablet by mouth once daily 90 tablet 3  . lisinopril (ZESTRIL) 10 MG tablet Take 1 tablet by mouth once daily 90 tablet 3  . omeprazole (PRILOSEC) 40 MG capsule Take 1 capsule (40 mg total) by mouth daily. 90 capsule 3   No current facility-administered medications on file prior to visit.    Allergies  Allergen Reactions  . Dapsone Other (See Comments)  . Hydrochlorothiazide Other (See  Comments)  . Hydrochlorothiazide W-Triamterene     REACTION: Palps REACTION: Palps  . Naprosyn [Naproxen] Other (See Comments)    Insomnia, stomach upset  . Sulfa Antibiotics     REACTION: unspecified  . Sulfonamide Derivatives     REACTION: unspecified    Past Medical History:  Diagnosis Date  . Allergy   . Anemia   . Hypertension   . Osteoarthritis     Past Surgical History:  Procedure Laterality Date  . COLONOSCOPY WITH PROPOFOL N/A 08/05/2018   Procedure: COLONOSCOPY WITH PROPOFOL;  Surgeon: Virgel Manifold, MD;  Location: [facility] ENDOSCOPY;  Service: Endoscopy;  Laterality: N/A;  . [place]. Myoview- apical thinning, no ischemia EF 53% 02/08    Family History  Problem Relation Age of Onset  . Hypertension Mother   . Osteoporosis Mother   . Hypertension Father   . Coronary artery disease Neg Hx   . Diabetes Neg Hx   . Cancer Neg Hx     Social History   Socioeconomic History  . Marital status: Widowed    Spouse name: Not on file  . Number of children: 1  . Years of education: Not on file  . Highest education level: Not on file  Occupational History  . Occupation: Retired---- Engineer, site for Film/video editor: RETIRED    Comment:    Tobacco Use  . Smoking status: Never Smoker  .  Smokeless tobacco: Former Systems developer    Types: Chew  Substance and Sexual Activity  . Alcohol use: No  . Drug use: Not Currently  . Sexual activity: Not on file  Other Topics Concern  . Not on file  Social History Narrative   Widowed 2020      No living will   No health care POA-- daughter would be the one.   Would accept resuscitation attempts but no prolonged artificial ventilation   Probably would accept tube feeds         Social Determinants of Health   Financial Resource Strain:   . Difficulty of Paying Living Expenses: Not on file  Food Insecurity:   . Worried About Charity fundraiser in the Last Year: Not on file  . Ran Out of Food in the  Last Year: Not on file  Transportation Needs:   . Lack of Transportation (Medical): Not on file  . Lack of Transportation (Non-Medical): Not on file  Physical Activity:   . Days of Exercise per Week: Not on file  . Minutes of Exercise per Session: Not on file  Stress:   . Feeling of Stress : Not on file  Social Connections:   . Frequency of Communication with Friends and Family: Not on file  . Frequency of Social Gatherings with Friends and Family: Not on file  . Attends Religious Services: Not on file  . Active Member of Clubs or Organizations: Not on file  . Attends Archivist Meetings: Not on file  . Marital Status: Not on file  Intimate Partner Violence:   . Fear of Current or Ex-Partner: Not on file  . Emotionally Abused: Not on file  . Physically Abused: Not on file  . Sexually Abused: Not on file   Review of Systems Appetite is okay Weight is down slightly    Objective:   Physical Exam  Constitutional: He appears well-developed. No distress.  GI: Soft. He exhibits no distension. There is no abdominal tenderness. There is no rebound and no guarding.  Musculoskeletal:     Comments: No spine tenderness Pain spot is right lumbar paraspinals SLR negative ROM normal in hips  Neurological:  Normal gait Normal leg strength           Assessment & Plan:

## 2019-03-15 NOTE — Assessment & Plan Note (Signed)
Vague and seems to be acid related Better with a couple of days of PPI bid Doubt gallbladder--but would check ultrasound if recurrent issues

## 2019-03-25 ENCOUNTER — Ambulatory Visit: Payer: Medicare Other | Admitting: Gastroenterology

## 2019-03-29 DIAGNOSIS — K8020 Calculus of gallbladder without cholecystitis without obstruction: Secondary | ICD-10-CM | POA: Diagnosis not present

## 2019-03-29 DIAGNOSIS — Z79899 Other long term (current) drug therapy: Secondary | ICD-10-CM | POA: Diagnosis not present

## 2019-03-29 DIAGNOSIS — R1010 Upper abdominal pain, unspecified: Secondary | ICD-10-CM | POA: Diagnosis not present

## 2019-03-29 DIAGNOSIS — R140 Abdominal distension (gaseous): Secondary | ICD-10-CM | POA: Diagnosis not present

## 2019-03-29 DIAGNOSIS — K760 Fatty (change of) liver, not elsewhere classified: Secondary | ICD-10-CM | POA: Diagnosis not present

## 2019-03-29 DIAGNOSIS — Z882 Allergy status to sulfonamides status: Secondary | ICD-10-CM | POA: Diagnosis not present

## 2019-03-29 DIAGNOSIS — N189 Chronic kidney disease, unspecified: Secondary | ICD-10-CM | POA: Diagnosis not present

## 2019-03-29 DIAGNOSIS — R110 Nausea: Secondary | ICD-10-CM | POA: Diagnosis not present

## 2019-03-29 DIAGNOSIS — I129 Hypertensive chronic kidney disease with stage 1 through stage 4 chronic kidney disease, or unspecified chronic kidney disease: Secondary | ICD-10-CM | POA: Diagnosis not present

## 2019-04-07 ENCOUNTER — Encounter (INDEPENDENT_AMBULATORY_CARE_PROVIDER_SITE_OTHER): Payer: Self-pay

## 2019-04-07 ENCOUNTER — Ambulatory Visit (INDEPENDENT_AMBULATORY_CARE_PROVIDER_SITE_OTHER): Payer: Medicare Other | Admitting: Gastroenterology

## 2019-04-07 ENCOUNTER — Other Ambulatory Visit: Payer: Self-pay

## 2019-04-07 ENCOUNTER — Encounter: Payer: Self-pay | Admitting: Gastroenterology

## 2019-04-07 VITALS — BP 134/85 | HR 75 | Temp 98.2°F | Ht 71.0 in | Wt 186.0 lb

## 2019-04-07 DIAGNOSIS — R140 Abdominal distension (gaseous): Secondary | ICD-10-CM

## 2019-04-07 DIAGNOSIS — R1030 Lower abdominal pain, unspecified: Secondary | ICD-10-CM | POA: Diagnosis not present

## 2019-04-07 DIAGNOSIS — K5909 Other constipation: Secondary | ICD-10-CM | POA: Diagnosis not present

## 2019-04-07 NOTE — Progress Notes (Signed)
Darren Darby, MD [address]  [place]  [place], [place] [address]  Main: [phone]  Fax: [phone] Pager: [phone]   Primary Care Physician: Venia Carbon, MD  Primary Gastroenterologist:  Dr. Bonna Gains  Chief Complaint  Patient presents with  . Constipation    Patient states the constipation is no changed  . Abdominal Pain    Patient is having no pain     HPI: Darren Atkins is a 79 y.o. male is here for follow-up of lower abdominal pain associated with bloating and chronic constipation.  Patient reports that he has been experiencing constipation almost his entire life.  He acknowledges not drinking water daily.  He does report generalized lower abdominal pain, starting below the umbilicus associated with bloating.  He takes Dulcolax every night to have a bowel movement.  He does report sensation of incomplete emptying.  He drinks one 8 ounce soda daily.  He was seen by Dr. Silvio Pate for generalized abdominal pain on 2/8, thought to be acid related and he was prescribed Prilosec twice daily.  He was recommended to get an ultrasound to evaluate for gallbladder etiology if it recurs.  Patient went to [facility] ER on 2/22 secondary to recurrence of lower abdominal pain associated with nausea which revealed normal CBC, mildly elevated total bilirubin, underwent right upper quadrant ultrasound which revealed cholelithiasis without evidence of acute cholecystitis and as well as fatty liver.  He was recommended to follow-up with gastroenterology and he also has an appointment to see general surgery to evaluate for cholecystectomy in couple of weeks.  On repeated asking, patient reports that his concern is always about the lower abdominal pain with abdominal bloating.  He never experienced right upper quadrant pain or the epigastric pain.  He denies difficulty swallowing  He does not smoke or drink alcohol  Colonoscopy on 08/05/2018 by Dr. Bonna Gains  - Two 2 to 3 mm polyps in the  ascending colon and in the cecum, removed with a cold biopsy forceps. Resected and retrieved. - Two 4 to 5 mm polyps in the transverse colon, removed with a cold snare. Resected and retrieved. - Diverticulosis in the sigmoid colon and in the ascending colon. - The examination was otherwise normal. - The rectum, sigmoid colon, descending colon, transverse colon, ascending colon and cecum are normal. - The distal rectum and anal verge are normal on retroflexion view. - Anal papilla(e) were hypertrophied.  DIAGNOSIS:  A. COLON POLYP X 2, CECUM AND ASCENDING; COLD BIOPSY:  - TUBULAR ADENOMAS, 2 FRAGMENTS.  - NEGATIVE FOR HIGH-GRADE DYSPLASIA AND MALIGNANCY.   B. COLON POLYP X 2, TRANSVERSE; COLD SNARE:  - TUBULAR ADENOMAS, 2 FRAGMENTS.  - NEGATIVE FOR HIGH-GRADE DYSPLASIA AND MALIGNANCY.  Current Outpatient Medications  Medication Sig Dispense Refill  . amLODipine (NORVASC) 10 MG tablet TAKE 1 TABLET BY MOUTH ONCE DAILY . APPOINTMENT REQUIRED FOR FUTURE REFILLS 90 tablet 3  . cyanocobalamin (,VITAMIN B-12,) 1000 MCG/ML injection INJECT 1ML INTRAMUSCULARLY ONCE EVERY 30 DAYS 3 mL 3  . docusate sodium (COLACE) 100 MG capsule Take 100 mg by mouth at bedtime as needed.      . fluticasone (FLONASE) 50 MCG/ACT nasal spray USE TWO SPRAY(S) IN EACH NOSTRIL ONCE DAILY 16 g 11  . folic acid (FOLVITE) 1 MG tablet Take 1 tablet by mouth once daily 90 tablet 3  . lisinopril (ZESTRIL) 10 MG tablet Take 1 tablet by mouth once daily 90 tablet 3  . omeprazole (PRILOSEC) 40 MG capsule  Take 1 capsule (40 mg total) by mouth 2 (two) times daily. Can decrease to daily if symptoms improve 180 capsule 3   No current facility-administered medications for this visit.    Allergies as of 04/07/2019 - Review Complete 04/07/2019  Allergen Reaction Noted  . Dapsone Other (See Comments) 05/27/2017  . Hydrochlorothiazide Other (See Comments) 05/27/2017  . Hydrochlorothiazide w-triamterene  07/13/2018  . Naprosyn  [naproxen] Other (See Comments) 09/18/2010  . Sulfa antibiotics  04/11/2006  . Sulfonamide derivatives  04/11/2006    NSAIDs: None  Antiplts/Anticoagulants/Anti thrombotics: None   ROS:  General: Negative for anorexia, weight loss, fever, chills, fatigue, weakness. ENT: Negative for hoarseness, difficulty swallowing , nasal congestion. CV: Negative for chest pain, angina, palpitations, dyspnea on exertion, peripheral edema.  Respiratory: Negative for dyspnea at rest, dyspnea on exertion, cough, sputum, wheezing.  GI: See history of present illness. GU:  Negative for dysuria, hematuria, urinary incontinence, urinary frequency, nocturnal urination.  Endo: Negative for unusual weight change.    Physical Examination:   BP 134/85 (BP Location: Left Arm, Patient Position: Sitting, Cuff Size: Large)   Pulse 75   Temp 98.2 F (36.8 C) (Oral)   Ht 5\' 11"  (1.803 m)   Wt 186 lb (84.4 kg)   BMI 25.94 kg/m   General: Well-nourished, well-developed in no acute distress.  Eyes: No icterus. Conjunctivae pink. Mouth: Oropharyngeal mucosa moist and pink , no lesions erythema or exudate. Lungs: Clear to auscultation bilaterally. Non-labored. Heart: Regular rate and rhythm, no murmurs rubs or gallops.  Abdomen: Bowel sounds are normal, nontender, mildly distended, tympanic to percussion, no hepatosplenomegaly or masses, no hernia , no rebound or guarding.   Extremities: No lower extremity edema. No clubbing or deformities. Neuro: Alert and oriented x 3.  Grossly intact. Skin: Warm and dry, no jaundice.   Psych: Alert and cooperative, normal mood and affect.  Imaging Studies: Reviewed  Assessment and Plan:   Darren Atkins is a 79 y.o. male with no significant past medical history, history of chronic constipation is seen in consultation for chronic lower abdominal pain associated with abdominal bloating.  His symptoms are highly suggestive of chronic constipation.  Reassured patient that his  symptoms are not related to gallbladder etiology  Chronic constipation with lower abdominal pain and abdominal bloating Discussed about high-fiber diet, information provided Fiberchoice samples given Start Linzess 72 MCG daily Stop Dulcolax Stop carbonated beverages Adequate intake of water  Follow up with Dr. Bonna Gains in 4 to 6 weeks   Dr Sherri Sear, MD

## 2019-04-07 NOTE — Patient Instructions (Signed)
1. Stop dulcolax 2. Drink 2-3 lit of water daily 3. Stop sodas 4. Start linzess 13mcg daily before breakfast 5. Start high fiber diet 6. Start fiber choice  Please call our office during business hours from 8am to 4pm if you have any questions/concerns. During after hours, you will be redirected to on call GI physician. For any emergency please call 911 or go the nearest emergency room.    Cephas Darby, MD [address]  [place]  [place], [place] [address]  Main: [phone]  Fax: [phone]      High-Fiber Diet Fiber, also called dietary fiber, is a type of carbohydrate that is found in fruits, vegetables, whole grains, and beans. A high-fiber diet can have many health benefits. Your health care provider may recommend a high-fiber diet to help:  Prevent constipation. Fiber can make your bowel movements more regular.  Lower your cholesterol.  Relieve the following conditions: ? Swelling of veins in the anus (hemorrhoids). ? Swelling and irritation (inflammation) of specific areas of the digestive tract (uncomplicated diverticulosis). ? A problem of the large intestine (colon) that sometimes causes pain and diarrhea (irritable bowel syndrome, IBS).  Prevent overeating as part of a weight-loss plan.  Prevent heart disease, type 2 diabetes, and certain cancers. What is my plan? The recommended daily fiber intake in grams (g) includes:  38 g for men age 22 or younger.  30 g for men over age 48.  80 g for women age 55 or younger.  21 g for women over age 50. You can get the recommended daily intake of dietary fiber by:  Eating a variety of fruits, vegetables, grains, and beans.  Taking a fiber supplement, if it is not possible to get enough fiber through your diet. What do I need to know about a high-fiber diet?  It is better to get fiber through food sources rather than from fiber supplements. There is not a lot of research about how effective supplements  are.  Always check the fiber content on the nutrition facts label of any prepackaged food. Look for foods that contain 5 g of fiber or more per serving.  Talk with a diet and nutrition specialist (dietitian) if you have questions about specific foods that are recommended or not recommended for your medical condition, especially if those foods are not listed below.  Gradually increase how much fiber you consume. If you increase your intake of dietary fiber too quickly, you may have bloating, cramping, or gas.  Drink plenty of water. Water helps you to digest fiber. What are tips for following this plan?  Eat a wide variety of high-fiber foods.  Make sure that half of the grains that you eat each day are whole grains.  Eat breads and cereals that are made with whole-grain flour instead of refined flour or white flour.  Eat brown rice, bulgur wheat, or millet instead of white rice.  Start the day with a breakfast that is high in fiber, such as a cereal that contains 5 g of fiber or more per serving.  Use beans in place of meat in soups, salads, and pasta dishes.  Eat high-fiber snacks, such as berries, raw vegetables, nuts, and popcorn.  Choose whole fruits and vegetables instead of processed forms like juice or sauce. What foods can I eat?  Fruits Berries. Pears. Apples. Oranges. Avocado. Prunes and raisins. Dried figs. Vegetables Sweet potatoes. Spinach. Kale. Artichokes. Cabbage. Broccoli. Cauliflower. Green peas. Carrots. Squash. Grains Whole-grain breads. Multigrain  cereal. Oats and oatmeal. Brown rice. Barley. Bulgur wheat. Enoch. Quinoa. Bran muffins. Popcorn. Rye wafer crackers. Meats and other proteins Navy, kidney, and pinto beans. Soybeans. Split peas. Lentils. Nuts and seeds. Dairy Fiber-fortified yogurt. Beverages Fiber-fortified soy milk. Fiber-fortified orange juice. Other foods Fiber bars. The items listed above may not be a complete list of recommended foods  and beverages. Contact a dietitian for more options. What foods are not recommended? Fruits Fruit juice. Cooked, strained fruit. Vegetables Fried potatoes. Canned vegetables. Well-cooked vegetables. Grains White bread. Pasta made with refined flour. White rice. Meats and other proteins Fatty cuts of meat. Fried chicken or fried fish. Dairy Milk. Yogurt. Cream cheese. Sour cream. Fats and oils Butters. Beverages Soft drinks. Other foods Cakes and pastries. The items listed above may not be a complete list of foods and beverages to avoid. Contact a dietitian for more information. Summary  Fiber is a type of carbohydrate. It is found in fruits, vegetables, whole grains, and beans.  There are many health benefits of eating a high-fiber diet, such as preventing constipation, lowering blood cholesterol, helping with weight loss, and reducing your risk of heart disease, diabetes, and certain cancers.  Gradually increase your intake of fiber. Increasing too fast can result in cramping, bloating, and gas. Drink plenty of water while you increase your fiber.  The best sources of fiber include whole fruits and vegetables, whole grains, nuts, seeds, and beans. This information is not intended to replace advice given to you by your health care provider. Make sure you discuss any questions you have with your health care provider. Document Revised: 11/25/2016 Document Reviewed: 11/25/2016 Elsevier Patient Education  2020 [employer].

## 2019-04-22 DIAGNOSIS — R1030 Lower abdominal pain, unspecified: Secondary | ICD-10-CM | POA: Diagnosis not present

## 2019-04-22 DIAGNOSIS — K8020 Calculus of gallbladder without cholecystitis without obstruction: Secondary | ICD-10-CM | POA: Diagnosis not present

## 2019-04-22 DIAGNOSIS — K5900 Constipation, unspecified: Secondary | ICD-10-CM | POA: Diagnosis not present

## 2019-04-30 ENCOUNTER — Telehealth: Payer: Self-pay | Admitting: Gastroenterology

## 2019-04-30 ENCOUNTER — Other Ambulatory Visit: Payer: Self-pay

## 2019-04-30 MED ORDER — LINACLOTIDE 72 MCG PO CAPS: 72.0000 ug | ORAL_CAPSULE | Freq: Every day | ORAL | 6 refills | Status: DC

## 2019-04-30 NOTE — Telephone Encounter (Signed)
Pt left vm he needs a prescription for [employer]

## 2019-04-30 NOTE — Telephone Encounter (Signed)
Prescription for Linzess 62mcg was sent to pt's pharmacy.

## 2019-05-04 ENCOUNTER — Encounter: Payer: Self-pay | Admitting: Internal Medicine

## 2019-05-04 ENCOUNTER — Other Ambulatory Visit: Payer: Self-pay

## 2019-05-04 ENCOUNTER — Ambulatory Visit (INDEPENDENT_AMBULATORY_CARE_PROVIDER_SITE_OTHER): Payer: Medicare Other | Admitting: Internal Medicine

## 2019-05-04 DIAGNOSIS — N529 Male erectile dysfunction, unspecified: Secondary | ICD-10-CM

## 2019-05-04 MED ORDER — VARDENAFIL HCL 20 MG PO TABS: 10.0000 mg | ORAL_TABLET | Freq: Every day | ORAL | 3 refills | Status: DC | PRN

## 2019-05-04 NOTE — Progress Notes (Signed)
Subjective:    Patient ID: Darren Atkins, male    DOB: 22-Jun-1940, 79 y.o.   MRN: [id number]  HPI Here due to issues with ED This visit occurred during the SARS-CoV-2 public health emergency.  Safety protocols were in place, including screening questions prior to the visit, additional usage of staff PPE, and extensive cleaning of exam room while observing appropriate contact time as indicated for disinfecting solutions.   Is planning to get married soon She lives less than a mile away Has known her 6-8 years but only started in relationship She is 70--and widowed twice  Current Outpatient Medications on File Prior to Visit  Medication Sig Dispense Refill  . amLODipine (NORVASC) 10 MG tablet TAKE 1 TABLET BY MOUTH ONCE DAILY . APPOINTMENT REQUIRED FOR FUTURE REFILLS 90 tablet 3  . cyanocobalamin (,VITAMIN B-12,) 1000 MCG/ML injection INJECT 1ML INTRAMUSCULARLY ONCE EVERY 30 DAYS 3 mL 3  . docusate sodium (COLACE) 100 MG capsule Take 100 mg by mouth at bedtime as needed.      . fluticasone (FLONASE) 50 MCG/ACT nasal spray USE TWO SPRAY(S) IN EACH NOSTRIL ONCE DAILY 16 g 11  . folic acid (FOLVITE) 1 MG tablet Take 1 tablet by mouth once daily 90 tablet 3  . linaclotide (LINZESS) 72 MCG capsule Take 1 capsule (72 mcg total) by mouth daily before breakfast. 30 capsule 6  . lisinopril (ZESTRIL) 10 MG tablet Take 1 tablet by mouth once daily 90 tablet 3  . omeprazole (PRILOSEC) 40 MG capsule Take 1 capsule (40 mg total) by mouth 2 (two) times daily. Can decrease to daily if symptoms improve 180 capsule 3   No current facility-administered medications on file prior to visit.    Allergies  Allergen Reactions  . Dapsone Other (See Comments)  . Hydrochlorothiazide Other (See Comments)  . Hydrochlorothiazide W-Triamterene     REACTION: Palps REACTION: Palps  . Naprosyn [Naproxen] Other (See Comments)    Insomnia, stomach upset  . Sulfa Antibiotics     REACTION: unspecified  . Sulfonamide  Derivatives     REACTION: unspecified    Past Medical History:  Diagnosis Date  . Allergy   . Anemia   . Hypertension   . Osteoarthritis     Past Surgical History:  Procedure Laterality Date  . COLONOSCOPY WITH PROPOFOL N/A 08/05/2018   Procedure: COLONOSCOPY WITH PROPOFOL;  Surgeon: Virgel Manifold, MD;  Location: [facility] ENDOSCOPY;  Service: Endoscopy;  Laterality: N/A;  . [place]. Myoview- apical thinning, no ischemia EF 53% 02/08    Family History  Problem Relation Age of Onset  . Hypertension Mother   . Osteoporosis Mother   . Hypertension Father   . Coronary artery disease Neg Hx   . Diabetes Neg Hx   . Cancer Neg Hx     Social History   Socioeconomic History  . Marital status: Widowed    Spouse name: Not on file  . Number of children: 1  . Years of education: Not on file  . Highest education level: Not on file  Occupational History  . Occupation: Retired---- Engineer, site for Film/video editor: RETIRED    Comment:    Tobacco Use  . Smoking status: Never Smoker  . Smokeless tobacco: Former Systems developer    Types: Chew  Substance and Sexual Activity  . Alcohol use: No  . Drug use: Not Currently  . Sexual activity: Not on file  Other Topics  Concern  . Not on file  Social History Narrative   Widowed 2020      No living will   No health care POA-- daughter would be the one.   Would accept resuscitation attempts but no prolonged artificial ventilation   Probably would accept tube feeds         Social Determinants of Health   Financial Resource Strain:   . Difficulty of Paying Living Expenses:   Food Insecurity:   . Worried About Charity fundraiser in the Last Year:   . Arboriculturist in the Last Year:   Transportation Needs:   . Film/video editor (Medical):   Marland Kitchen Lack of Transportation (Non-Medical):   Physical Activity:   . Days of Exercise per Week:   . Minutes of Exercise per Session:   Stress:   . Feeling of Stress :    Social Connections:   . Frequency of Communication with Friends and Family:   . Frequency of Social Gatherings with Friends and Family:   . Attends Religious Services:   . Active Member of Clubs or Organizations:   . Attends Archivist Meetings:   Marland Kitchen Marital Status:   Intimate Partner Violence:   . Fear of Current or Ex-Partner:   . Emotionally Abused:   Marland Kitchen Physically Abused:   . Sexually Abused:    Review of Systems No chest pain No SOB No dizziness Just occasional vertigo    Objective:   Physical Exam  Constitutional: He appears well-developed. No distress.  Psychiatric: He has a normal mood and affect. His behavior is normal.           Assessment & Plan:

## 2019-05-04 NOTE — Assessment & Plan Note (Signed)
He has not health issues that preclude trying medications again Asked him to price out alternatives Will consider levitra (he tolerated in the past), cialis, viagra---will send Rx if he finds reasonable place to get

## 2019-05-04 NOTE — Patient Instructions (Signed)
Please get prices for levitra 20mg , cialis 20mg  and viagra 100mg . All of these would probably start with 1/2 tab---so if they have better prices for 10mg , 10mg  and 50mg  respectively--that would be okay. If you find a pharmacy that has a good deal--call and let me know the medication, dose and pharmacy and I will send the prescription.

## 2019-05-14 ENCOUNTER — Telehealth: Payer: Self-pay | Admitting: Internal Medicine

## 2019-05-14 NOTE — Telephone Encounter (Signed)
Pt called and said he called around to see which pharmacy had the best deal on ED medication. He found the [facility] in [place] has the best price for the high strength Cialis.

## 2019-05-16 NOTE — Telephone Encounter (Signed)
Please send Rx for 20mg   #10 x 5 1 every 3 days prn for ED

## 2019-05-17 MED ORDER — TADALAFIL 20 MG PO TABS: 20.0000 mg | ORAL_TABLET | ORAL | 5 refills | Status: DC | PRN

## 2019-05-17 NOTE — Telephone Encounter (Signed)
New rx sent to [facility] and left message for pt on verified VM and DPR

## 2019-07-14 ENCOUNTER — Other Ambulatory Visit: Payer: Self-pay

## 2019-07-14 ENCOUNTER — Encounter: Payer: Self-pay | Admitting: Internal Medicine

## 2019-07-14 ENCOUNTER — Ambulatory Visit (INDEPENDENT_AMBULATORY_CARE_PROVIDER_SITE_OTHER): Payer: Medicare Other | Admitting: Internal Medicine

## 2019-07-14 DIAGNOSIS — N529 Male erectile dysfunction, unspecified: Secondary | ICD-10-CM | POA: Diagnosis not present

## 2019-07-14 NOTE — Progress Notes (Signed)
Subjective:    Patient ID: Darren Atkins, male    DOB: 24-Apr-1940, 79 y.o.   MRN: [id number]  HPI Here due to ongoing ED issues This visit occurred during the SARS-CoV-2 public health emergency.  Safety protocols were in place, including screening questions prior to the visit, additional usage of staff PPE, and extensive cleaning of exam room while observing appropriate contact time as indicated for disinfecting solutions.   cialis helps some Taking 20mg  every 3 days Gets larger but not firm enough for penetration  Current Outpatient Medications on File Prior to Visit  Medication Sig Dispense Refill  . amLODipine (NORVASC) 10 MG tablet TAKE 1 TABLET BY MOUTH ONCE DAILY . APPOINTMENT REQUIRED FOR FUTURE REFILLS 90 tablet 3  . cyanocobalamin (,VITAMIN B-12,) 1000 MCG/ML injection INJECT 1ML INTRAMUSCULARLY ONCE EVERY 30 DAYS 3 mL 3  . folic acid (FOLVITE) 1 MG tablet Take 1 tablet by mouth once daily 90 tablet 3  . linaclotide (LINZESS) 72 MCG capsule Take 1 capsule (72 mcg total) by mouth daily before breakfast. 30 capsule 6  . lisinopril (ZESTRIL) 10 MG tablet Take 1 tablet by mouth once daily 90 tablet 3  . omeprazole (PRILOSEC) 40 MG capsule Take 1 capsule (40 mg total) by mouth 2 (two) times daily. Can decrease to daily if symptoms improve 180 capsule 3  . tadalafil (CIALIS) 20 MG tablet Take 1 tablet (20 mg total) by mouth every three (3) days as needed for erectile dysfunction. 10 tablet 5  . fluticasone (FLONASE) 50 MCG/ACT nasal spray USE TWO SPRAY(S) IN EACH NOSTRIL ONCE DAILY (Patient not taking: Reported on 07/14/2019) 16 g 11   No current facility-administered medications on file prior to visit.    Allergies  Allergen Reactions  . Dapsone Other (See Comments)  . Hydrochlorothiazide Other (See Comments)  . Hydrochlorothiazide W-Triamterene     REACTION: Palps REACTION: Palps  . Naprosyn [Naproxen] Other (See Comments)    Insomnia, stomach upset  . Sulfa Antibiotics    REACTION: unspecified  . Sulfonamide Derivatives     REACTION: unspecified    Past Medical History:  Diagnosis Date  . Allergy   . Anemia   . Hypertension   . Osteoarthritis     Past Surgical History:  Procedure Laterality Date  . COLONOSCOPY WITH PROPOFOL N/A 08/05/2018   Procedure: COLONOSCOPY WITH PROPOFOL;  Surgeon: Virgel Manifold, MD;  Location: [facility] ENDOSCOPY;  Service: Endoscopy;  Laterality: N/A;  . [place]. Myoview- apical thinning, no ischemia EF 53% 02/08    Family History  Problem Relation Age of Onset  . Hypertension Mother   . Osteoporosis Mother   . Hypertension Father   . Coronary artery disease Neg Hx   . Diabetes Neg Hx   . Cancer Neg Hx     Social History   Socioeconomic History  . Marital status: Widowed    Spouse name: Not on file  . Number of children: 1  . Years of education: Not on file  . Highest education level: Not on file  Occupational History  . Occupation: Retired---- Engineer, site for Film/video editor: RETIRED    Comment:    Tobacco Use  . Smoking status: Never Smoker  . Smokeless tobacco: Former Systems developer    Types: Chew  Substance and Sexual Activity  . Alcohol use: No  . Drug use: Not Currently  . Sexual activity: Not on file  Other Topics Concern  .  Not on file  Social History Narrative   Widowed 2020      No living will   No health care POA-- daughter would be the one.   Would accept resuscitation attempts but no prolonged artificial ventilation   Probably would accept tube feeds         Social Determinants of Health   Financial Resource Strain:   . Difficulty of Paying Living Expenses:   Food Insecurity:   . Worried About Charity fundraiser in the Last Year:   . Arboriculturist in the Last Year:   Transportation Needs:   . Film/video editor (Medical):   Marland Kitchen Lack of Transportation (Non-Medical):   Physical Activity:   . Days of Exercise per Week:   . Minutes of Exercise per  Session:   Stress:   . Feeling of Stress :   Social Connections:   . Frequency of Communication with Friends and Family:   . Frequency of Social Gatherings with Friends and Family:   . Attends Religious Services:   . Active Member of Clubs or Organizations:   . Attends Archivist Meetings:   Marland Kitchen Marital Status:   Intimate Partner Violence:   . Fear of Current or Ex-Partner:   . Emotionally Abused:   Marland Kitchen Physically Abused:   . Sexually Abused:    Review of Systems Still feels well    Objective:   Physical Exam  Psychiatric: He has a normal mood and affect. His behavior is normal.           Assessment & Plan:

## 2019-07-14 NOTE — Assessment & Plan Note (Signed)
Ongoing problems despite reasonable dose cialis May need to consider testosterone supplementation or direct injections Will set up with urologist

## 2019-07-15 NOTE — Progress Notes (Signed)
07/16/19 3:08 PM   Darren Atkins [birth year] [id number]  Referring provider: Venia Carbon, MD [address] [place],  [place] [address] Chief Complaint  Patient presents with  . New Patient (Initial Visit)    ED    HPI: Darren Atkins is a 79 y.o. M who presents today for the evaluation and management of erectile dysfunction.   Visited PCP on 07/14/19 c/o ED issues. He has been taking Cialis 20 mg however erection not firm enough for penetration. He has not tried Cialis 3 hrs prior to sexual intercourse. He takes 1 tablet of Cialis q 3 days. He reports of Levitra being effective however it was 10 years ago.   He has recently remarried and has a renewed interest in sexual activity. He also reports of good libido.  No hx of cardiovascular disease.   PMH: Past Medical History:  Diagnosis Date  . Allergy   . Anemia   . Hypertension   . Osteoarthritis     Surgical History: Past Surgical History:  Procedure Laterality Date  . COLONOSCOPY WITH PROPOFOL N/A 08/05/2018   Procedure: COLONOSCOPY WITH PROPOFOL;  Surgeon: Virgel Manifold, MD;  Location: [facility] ENDOSCOPY;  Service: Endoscopy;  Laterality: N/A;  . [place]. Myoview- apical thinning, no ischemia EF 53% 02/08    Home Medications:  Allergies as of 07/16/2019      Reactions   Dapsone Other (See Comments)   Hydrochlorothiazide Other (See Comments)   Hydrochlorothiazide W-triamterene    REACTION: Palps REACTION: Palps   Naprosyn [naproxen] Other (See Comments)   Insomnia, stomach upset   Sulfa Antibiotics    REACTION: unspecified   Sulfonamide Derivatives    REACTION: unspecified      Medication List       Accurate as of July 16, 2019  3:08 PM. If you have any questions, ask your nurse or doctor.        STOP taking these medications   fluticasone 50 MCG/ACT nasal spray Commonly known as: FLONASE Stopped by: Hollice Espy, MD     TAKE these medications   amLODipine 10 MG  tablet Commonly known as: NORVASC TAKE 1 TABLET BY MOUTH ONCE DAILY . APPOINTMENT REQUIRED FOR FUTURE REFILLS   cyanocobalamin 1000 MCG/ML injection Commonly known as: (VITAMIN B-12) INJECT 1ML INTRAMUSCULARLY ONCE EVERY 30 DAYS   folic acid 1 MG tablet Commonly known as: FOLVITE Take 1 tablet by mouth once daily   linaclotide 72 MCG capsule Commonly known as: LINZESS Take 1 capsule (72 mcg total) by mouth daily before breakfast.   lisinopril 10 MG tablet Commonly known as: ZESTRIL Take 1 tablet by mouth once daily   omeprazole 40 MG capsule Commonly known as: PRILOSEC Take 1 capsule (40 mg total) by mouth 2 (two) times daily. Can decrease to daily if symptoms improve   tadalafil 20 MG tablet Commonly known as: Cialis Take 1 tablet (20 mg total) by mouth every three (3) days as needed for erectile dysfunction.       Allergies:  Allergies  Allergen Reactions  . Dapsone Other (See Comments)  . Hydrochlorothiazide Other (See Comments)  . Hydrochlorothiazide W-Triamterene     REACTION: Palps REACTION: Palps  . Naprosyn [Naproxen] Other (See Comments)    Insomnia, stomach upset  . Sulfa Antibiotics     REACTION: unspecified  . Sulfonamide Derivatives     REACTION: unspecified    Family History: Family History  Problem Relation Age  of Onset  . Hypertension Mother   . Osteoporosis Mother   . Hypertension Father   . Coronary artery disease Neg Hx   . Diabetes Neg Hx   . Cancer Neg Hx     Social History:  reports that he has never smoked. He quit smokeless tobacco use about 36 years ago.  His smokeless tobacco use included chew. He reports previous drug use. He reports that he does not drink alcohol.   Physical Exam: BP (!) 163/81   Pulse 73   Ht 6' (1.829 m)   Wt 190 lb (86.2 kg)   BMI 25.77 kg/m   Constitutional:  Alert and oriented, No acute distress. HEENT: [place] AT, moist mucus membranes.  Trachea midline, no masses. Cardiovascular: No clubbing,  cyanosis, or edema. Respiratory: Normal respiratory effort, no increased work of breathing. Skin: No rashes, bruises or suspicious lesions. Neurologic: Grossly intact, no focal deficits, moving all 4 extremities. Psychiatric: Normal mood and affect.  Assessment & Plan:    1. Erectile dysfunction  He tried Cialis and Levitra in the past, inadequate response despite optimal administration   Recommended trial of of Sildenafil x 5 tablets on an empty stomach 5 hrs before sexual activity  We discussed the pathophysiology of erectile dysfunction today along with possible contributing factors. Discussed possible treatment options vacuum erectile device, intracavernosal injection, MUSE, and placement of the inflatable or malleable penile prosthesis for refractory cases.  In terms of PDE 5 inhibitors, we discussed contraindications for this medication as well as common side effects. Patient was counseled on optimal use. All of his questions were answered in detail.  Rx of Sildenafil sent to pharmacy   He was given a handout on each of the treatment modalities as above.  He will let us know if he would like to pursue injections and I gave him an educational sheet on this as well.  We will need to arrange for test dose should he opt to pursue this   [facility] [address] [place], [place] [address] [phone]  I, Lucas Mallow, am acting as a scribe for Dr. Hollice Espy,  I have reviewed the above documentation for accuracy and completeness, and I agree with the above.   Hollice Espy, MD

## 2019-07-16 ENCOUNTER — Ambulatory Visit: Payer: Medicare Other | Admitting: Urology

## 2019-07-16 ENCOUNTER — Other Ambulatory Visit: Payer: Self-pay

## 2019-07-16 ENCOUNTER — Encounter: Payer: Self-pay | Admitting: Urology

## 2019-07-16 VITALS — BP 163/81 | HR 73 | Ht 72.0 in | Wt 190.0 lb

## 2019-07-16 DIAGNOSIS — N5203 Combined arterial insufficiency and corporo-venous occlusive erectile dysfunction: Secondary | ICD-10-CM | POA: Diagnosis not present

## 2019-07-16 MED ORDER — SILDENAFIL CITRATE 20 MG PO TABS: 20.0000 mg | ORAL_TABLET | ORAL | 11 refills | Status: DC | PRN

## 2019-08-04 DIAGNOSIS — J984 Other disorders of lung: Secondary | ICD-10-CM | POA: Diagnosis not present

## 2019-08-04 DIAGNOSIS — M47816 Spondylosis without myelopathy or radiculopathy, lumbar region: Secondary | ICD-10-CM | POA: Diagnosis not present

## 2019-08-04 DIAGNOSIS — N189 Chronic kidney disease, unspecified: Secondary | ICD-10-CM | POA: Diagnosis not present

## 2019-08-04 DIAGNOSIS — N281 Cyst of kidney, acquired: Secondary | ICD-10-CM | POA: Diagnosis not present

## 2019-08-04 DIAGNOSIS — R110 Nausea: Secondary | ICD-10-CM | POA: Diagnosis not present

## 2019-08-04 DIAGNOSIS — K828 Other specified diseases of gallbladder: Secondary | ICD-10-CM | POA: Diagnosis not present

## 2019-08-04 DIAGNOSIS — K8020 Calculus of gallbladder without cholecystitis without obstruction: Secondary | ICD-10-CM | POA: Diagnosis not present

## 2019-08-04 DIAGNOSIS — Z882 Allergy status to sulfonamides status: Secondary | ICD-10-CM | POA: Diagnosis not present

## 2019-08-04 DIAGNOSIS — K5730 Diverticulosis of large intestine without perforation or abscess without bleeding: Secondary | ICD-10-CM | POA: Diagnosis not present

## 2019-08-04 DIAGNOSIS — N2889 Other specified disorders of kidney and ureter: Secondary | ICD-10-CM | POA: Diagnosis not present

## 2019-08-04 DIAGNOSIS — I129 Hypertensive chronic kidney disease with stage 1 through stage 4 chronic kidney disease, or unspecified chronic kidney disease: Secondary | ICD-10-CM | POA: Diagnosis not present

## 2019-08-04 DIAGNOSIS — I700 Atherosclerosis of aorta: Secondary | ICD-10-CM | POA: Diagnosis not present

## 2019-08-04 DIAGNOSIS — R1011 Right upper quadrant pain: Secondary | ICD-10-CM | POA: Diagnosis not present

## 2019-08-04 DIAGNOSIS — I70203 Unspecified atherosclerosis of native arteries of extremities, bilateral legs: Secondary | ICD-10-CM | POA: Diagnosis not present

## 2019-08-09 DIAGNOSIS — R932 Abnormal findings on diagnostic imaging of liver and biliary tract: Secondary | ICD-10-CM | POA: Diagnosis not present

## 2019-08-09 DIAGNOSIS — R1011 Right upper quadrant pain: Secondary | ICD-10-CM | POA: Diagnosis not present

## 2019-08-09 DIAGNOSIS — K828 Other specified diseases of gallbladder: Secondary | ICD-10-CM | POA: Diagnosis not present

## 2019-08-13 ENCOUNTER — Encounter: Payer: Self-pay | Admitting: Internal Medicine

## 2019-08-13 ENCOUNTER — Ambulatory Visit (INDEPENDENT_AMBULATORY_CARE_PROVIDER_SITE_OTHER): Payer: Medicare Other | Admitting: Internal Medicine

## 2019-08-13 ENCOUNTER — Other Ambulatory Visit: Payer: Self-pay

## 2019-08-13 VITALS — BP 122/80 | HR 85 | Temp 97.9°F | Ht 71.0 in | Wt 185.0 lb

## 2019-08-13 DIAGNOSIS — N2889 Other specified disorders of kidney and ureter: Secondary | ICD-10-CM | POA: Insufficient documentation

## 2019-08-13 DIAGNOSIS — K5900 Constipation, unspecified: Secondary | ICD-10-CM

## 2019-08-13 DIAGNOSIS — R1011 Right upper quadrant pain: Secondary | ICD-10-CM | POA: Diagnosis not present

## 2019-08-13 MED ORDER — LINACLOTIDE 145 MCG PO CAPS: 145.0000 ug | ORAL_CAPSULE | Freq: Every day | ORAL | 3 refills | Status: DC

## 2019-08-13 NOTE — Progress Notes (Signed)
Subjective:    Patient ID: Darren Atkins, male    DOB: 16-Sep-1940, 80 y.o.   MRN: [id number]  HPI Here with wife for ER follow up This visit occurred during the SARS-CoV-2 public health emergency.  Safety protocols were in place, including screening questions prior to the visit, additional usage of staff PPE, and extensive cleaning of exam room while observing appropriate contact time as indicated for disinfecting solutions.   Started about 10 days ago---awoke with severe abdominal pain Ultrasound showed gallstones (not new) CT showed bilateral renal masses No cholecystitis on HIDA scan  Since then-- will double the prilosec at times due to recurrence of pain This may help some Bowels are still slow--linzess not that helpful  Current Outpatient Medications on File Prior to Visit  Medication Sig Dispense Refill  . amLODipine (NORVASC) 10 MG tablet TAKE 1 TABLET BY MOUTH ONCE DAILY . APPOINTMENT REQUIRED FOR FUTURE REFILLS 90 tablet 3  . cyanocobalamin (,VITAMIN B-12,) 1000 MCG/ML injection INJECT 1ML INTRAMUSCULARLY ONCE EVERY 30 DAYS 3 mL 3  . folic acid (FOLVITE) 1 MG tablet Take 1 tablet by mouth once daily 90 tablet 3  . linaclotide (LINZESS) 72 MCG capsule Take 1 capsule (72 mcg total) by mouth daily before breakfast. 30 capsule 6  . lisinopril (ZESTRIL) 10 MG tablet Take 1 tablet by mouth once daily 90 tablet 3  . omeprazole (PRILOSEC) 40 MG capsule Take 1 capsule (40 mg total) by mouth 2 (two) times daily. Can decrease to daily if symptoms improve 180 capsule 3  . ondansetron (ZOFRAN-ODT) 4 MG disintegrating tablet Take 4 mg by mouth every 8 (eight) hours as needed.    . sildenafil (REVATIO) 20 MG tablet Take 1 tablet (20 mg total) by mouth as needed. Take 1-5 tabs as needed prior to intercourse 30 tablet 11  . tadalafil (CIALIS) 20 MG tablet Take 1 tablet (20 mg total) by mouth every three (3) days as needed for erectile dysfunction. 10 tablet 5   No current  facility-administered medications on file prior to visit.    Allergies  Allergen Reactions  . Dapsone Other (See Comments)  . Hydrochlorothiazide Other (See Comments)  . Hydrochlorothiazide W-Triamterene     REACTION: Palps REACTION: Palps  . Naprosyn [Naproxen] Other (See Comments)    Insomnia, stomach upset  . Sulfa Antibiotics     REACTION: unspecified  . Sulfonamide Derivatives     REACTION: unspecified    Past Medical History:  Diagnosis Date  . Allergy   . Anemia   . Hypertension   . Osteoarthritis     Past Surgical History:  Procedure Laterality Date  . COLONOSCOPY WITH PROPOFOL N/A 08/05/2018   Procedure: COLONOSCOPY WITH PROPOFOL;  Surgeon: Virgel Manifold, MD;  Location: [facility] ENDOSCOPY;  Service: Endoscopy;  Laterality: N/A;  . [place]. Myoview- apical thinning, no ischemia EF 53% 02/08    Family History  Problem Relation Age of Onset  . Hypertension Mother   . Osteoporosis Mother   . Hypertension Father   . Coronary artery disease Neg Hx   . Diabetes Neg Hx   . Cancer Neg Hx     Social History   Socioeconomic History  . Marital status: Married    Spouse name: Not on file  . Number of children: 1  . Years of education: Not on file  . Highest education level: Not on file  Occupational History  . 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 Smoking status: Never Smoker  . Smokeless tobacco: Former Systems developer    Types: Secondary school teacher  . Vaping Use: Never used  Substance and Sexual Activity  . Alcohol use: No  . Drug use: Not Currently  . Sexual activity: Not on file  Other Topics Concern  . Not on file  Social History Narrative   Widowed 2020   Remarried 2021      No living will   No health care POA-- daughter would be the one.   Would accept resuscitation attempts but no prolonged artificial ventilation   Probably would accept tube feeds         Social  Determinants of Health   Financial Resource Strain:   . Difficulty of Paying Living Expenses:   Food Insecurity:   . Worried About Charity fundraiser in the Last Year:   . Arboriculturist in the Last Year:   Transportation Needs:   . Film/video editor (Medical):   Marland Kitchen Lack of Transportation (Non-Medical):   Physical Activity:   . Days of Exercise per Week:   . Minutes of Exercise per Session:   Stress:   . Feeling of Stress :   Social Connections:   . Frequency of Communication with Friends and Family:   . Frequency of Social Gatherings with Friends and Family:   . Attends Religious Services:   . Active Member of Clubs or Organizations:   . Attends Archivist Meetings:   Marland Kitchen Marital Status:   Intimate Partner Violence:   . Fear of Current or Ex-Partner:   . Emotionally Abused:   Marland Kitchen Physically Abused:   . Sexually Abused:    Review of Systems No N/V Eating is not great Has lost 5# recently No dysuria or hematuria No fever during the event No cough or SOB    Objective:   Physical Exam Constitutional:      Appearance: Normal appearance.  Cardiovascular:     Rate and Rhythm: Normal rate and regular rhythm.     Pulses: Normal pulses.     Heart sounds: No murmur heard.  No gallop.   Pulmonary:     Effort: Pulmonary effort is normal.     Breath sounds: Normal breath sounds. No wheezing or rales.  Abdominal:     General: There is no distension.     Palpations: Abdomen is soft. There is no mass.     Tenderness: There is no abdominal tenderness. There is no guarding.  Musculoskeletal:     Cervical back: Neck supple.     Right lower leg: No edema.     Left lower leg: No edema.  Lymphadenopathy:     Cervical: No cervical adenopathy.  Neurological:     Mental Status: He is alert.            Assessment & Plan:

## 2019-08-13 NOTE — Assessment & Plan Note (Signed)
Ongoing constipation issues linzess helps but not enough Will increase to 145mg 

## 2019-08-13 NOTE — Assessment & Plan Note (Signed)
Severe spell requiring ER visit No cholecystitis Ongoing milder symptoms Clearly could have passed gallstone---might need surgery reevaluation Could be his constipation (feels like that now with milder symptoms)--but unlikely to cause the severity of pain Unlikely acid related

## 2019-08-13 NOTE — Assessment & Plan Note (Signed)
Bilateral suspicious small masses Will set up with urology for evaluation

## 2019-08-25 ENCOUNTER — Encounter: Payer: Medicare Other | Admitting: Internal Medicine

## 2019-09-01 ENCOUNTER — Encounter: Payer: Self-pay | Admitting: Internal Medicine

## 2019-09-01 ENCOUNTER — Ambulatory Visit (INDEPENDENT_AMBULATORY_CARE_PROVIDER_SITE_OTHER): Payer: Medicare Other | Admitting: Internal Medicine

## 2019-09-01 ENCOUNTER — Other Ambulatory Visit: Payer: Self-pay

## 2019-09-01 VITALS — BP 124/80 | HR 66 | Temp 97.1°F | Ht 71.0 in | Wt 178.0 lb

## 2019-09-01 DIAGNOSIS — N1832 Chronic kidney disease, stage 3b: Secondary | ICD-10-CM

## 2019-09-01 DIAGNOSIS — I700 Atherosclerosis of aorta: Secondary | ICD-10-CM | POA: Diagnosis not present

## 2019-09-01 DIAGNOSIS — I10 Essential (primary) hypertension: Secondary | ICD-10-CM | POA: Diagnosis not present

## 2019-09-01 DIAGNOSIS — Z0000 Encounter for general adult medical examination without abnormal findings: Secondary | ICD-10-CM | POA: Diagnosis not present

## 2019-09-01 DIAGNOSIS — K581 Irritable bowel syndrome with constipation: Secondary | ICD-10-CM

## 2019-09-01 DIAGNOSIS — Z7189 Other specified counseling: Secondary | ICD-10-CM

## 2019-09-01 DIAGNOSIS — D510 Vitamin B12 deficiency anemia due to intrinsic factor deficiency: Secondary | ICD-10-CM

## 2019-09-01 DIAGNOSIS — K589 Irritable bowel syndrome without diarrhea: Secondary | ICD-10-CM | POA: Insufficient documentation

## 2019-09-01 MED ORDER — HYOSCYAMINE SULFATE 0.125 MG PO TBDP: 0.1250 mg | ORAL_TABLET | Freq: Three times a day (TID) | ORAL | 3 refills | Status: DC | PRN

## 2019-09-01 NOTE — Progress Notes (Signed)
Subjective:    Patient ID: Darren Atkins, male    DOB: 1940/05/16, 79 y.o.   MRN: [id number]  HPI Here for Medicare wellness visit and follow up of chronic health conditions This visit occurred during the SARS-CoV-2 public health emergency.  Safety protocols were in place, including screening questions prior to the visit, additional usage of staff PPE, and extensive cleaning of exam room while observing appropriate contact time as indicated for disinfecting solutions.   Reviewed advanced directives  Reviewed other doctors No hospitalizations or surgery in past year No alcohol or tobacco Vision is okay No functional hearing problems No regular exercise--discussed No falls No depression or anhedonia Independent with instrumental ADLs No memory issues  Still having some intestinal problems Still has full feeling in afternoons---and "gripey" ache at times Bowels move regularly with the linzess--but doesn't consider it a normal movement (lots of of water in it)---he does feel like he empties Inconsistent response to eating---sometimes better, sometimes worse. Avoiding fatty foods. Mostly worse in afternoon Hasn't had the RUQ pain again  Will see Dr Erlene Quan about the renal lesions  Reviewed aortic atherosclerosis on the CT scan Not excited about statin (either of [place])  No chest pain  No SOB No dizziness or syncope No edema No palpitations No headaches  GFR stable at 41  Current Outpatient Medications on File Prior to Visit  Medication Sig Dispense Refill  . amLODipine (NORVASC) 10 MG tablet TAKE 1 TABLET BY MOUTH ONCE DAILY . APPOINTMENT REQUIRED FOR FUTURE REFILLS 90 tablet 3  . cyanocobalamin (,VITAMIN B-12,) 1000 MCG/ML injection INJECT 1ML INTRAMUSCULARLY ONCE EVERY 30 DAYS 3 mL 3  . folic acid (FOLVITE) 1 MG tablet Take 1 tablet by mouth once daily 90 tablet 3  . linaclotide (LINZESS) 145 MCG CAPS capsule Take 1 capsule (145 mcg total) by mouth daily before breakfast. 90  capsule 3  . lisinopril (ZESTRIL) 10 MG tablet Take 1 tablet by mouth once daily 90 tablet 3  . omeprazole (PRILOSEC) 40 MG capsule Take 1 capsule (40 mg total) by mouth 2 (two) times daily. Can decrease to daily if symptoms improve 180 capsule 3  . ondansetron (ZOFRAN-ODT) 4 MG disintegrating tablet Take 4 mg by mouth every 8 (eight) hours as needed.    . sildenafil (REVATIO) 20 MG tablet Take 1 tablet (20 mg total) by mouth as needed. Take 1-5 tabs as needed prior to intercourse (Patient not taking: Reported on 09/01/2019) 30 tablet 11  . tadalafil (CIALIS) 20 MG tablet Take 1 tablet (20 mg total) by mouth every three (3) days as needed for erectile dysfunction. (Patient not taking: Reported on 09/01/2019) 10 tablet 5   No current facility-administered medications on file prior to visit.    Allergies  Allergen Reactions  . Dapsone Other (See Comments)  . Hydrochlorothiazide Other (See Comments)  . Hydrochlorothiazide W-Triamterene     REACTION: Palps REACTION: Palps  . Naprosyn [Naproxen] Other (See Comments)    Insomnia, stomach upset  . Sulfa Antibiotics     REACTION: unspecified  . Sulfonamide Derivatives     REACTION: unspecified    Past Medical History:  Diagnosis Date  . Allergy   . Anemia   . Hypertension   . Osteoarthritis     Past Surgical History:  Procedure Laterality Date  . COLONOSCOPY WITH PROPOFOL N/A 08/05/2018   Procedure: COLONOSCOPY WITH PROPOFOL;  Surgeon: Virgel Manifold, MD;  Location: [facility] ENDOSCOPY;  Service: Endoscopy;  Laterality: N/A;  . NM MYOVIEW  [place]. Myoview- apical thinning, no ischemia EF 53% 02/08    Family History  Problem Relation Age of Onset  . Hypertension Mother   . Osteoporosis Mother   . Hypertension Father   . Coronary artery disease Neg Hx   . Diabetes Neg Hx   . Cancer Neg Hx     Social History   Socioeconomic History  . Marital status: Married    Spouse name: Not on file  . Number of children: 1  . Years  of education: Not on file  . Highest education level: Not on file  Occupational History  . Occupation: Retired---- Engineer, site for Film/video editor: RETIRED    Comment:    Tobacco Use  . Smoking status: Never Smoker  . Smokeless tobacco: Former Systems developer    Types: Secondary school teacher  . Vaping Use: Never used  Substance and Sexual Activity  . Alcohol use: No  . Drug use: Not Currently  . Sexual activity: Not on file  Other Topics Concern  . Not on file  Social History Narrative   Widowed 2020   Remarried 2021      No living will   No health care POA-- daughter would be the one.   Would accept resuscitation attempts but no prolonged artificial ventilation   Probably would accept tube feeds         Social Determinants of Health   Financial Resource Strain:   . Difficulty of Paying Living Expenses:   Food Insecurity:   . Worried About Charity fundraiser in the Last Year:   . Arboriculturist in the Last Year:   Transportation Needs:   . Film/video editor (Medical):   Marland Kitchen Lack of Transportation (Non-Medical):   Physical Activity:   . Days of Exercise per Week:   . Minutes of Exercise per Session:   Stress:   . Feeling of Stress :   Social Connections:   . Frequency of Communication with Friends and Family:   . Frequency of Social Gatherings with Friends and Family:   . Attends Religious Services:   . Active Member of Clubs or Organizations:   . Attends Archivist Meetings:   Marland Kitchen Marital Status:   Intimate Partner Violence:   . Fear of Current or Ex-Partner:   . Emotionally Abused:   Marland Kitchen Physically Abused:   . Sexually Abused:    Review of Systems Appetite not great Has lost a few pounds Sleep is never great--no real change Wears seat belt Still happy in new marriage Full dentures No skin problems--doesn't see derm No heartburn or dysphagia Voids okay. No incontinence    Objective:   Physical Exam Constitutional:      Appearance: Normal  appearance.  HENT:     Head: Normocephalic and atraumatic.     Mouth/Throat:     Mouth: Mucous membranes are moist.     Comments: No lesions Eyes:     Conjunctiva/sclera: Conjunctivae normal.     Pupils: Pupils are equal, round, and reactive to light.  Cardiovascular:     Rate and Rhythm: Normal rate and regular rhythm.     Pulses: Normal pulses.     Heart sounds: No murmur heard.  No gallop.   Pulmonary:     Effort: Pulmonary effort is normal.     Breath sounds: Normal breath sounds. No wheezing or rales.  Abdominal:     Palpations: Abdomen is  soft. There is no mass.     Tenderness: There is no abdominal tenderness.  Musculoskeletal:     Cervical back: Neck supple.     Right lower leg: No edema.     Left lower leg: No edema.  Lymphadenopathy:     Cervical: No cervical adenopathy.  Skin:    General: Skin is warm.     Comments: No lesions  Neurological:     General: No focal deficit present.     Mental Status: He is alert and oriented to person, place, and time.     Comments: President--- "Zoila Shutter, Obama" [phone] [id number] Recall 2/3  Psychiatric:        Mood and Affect: Mood normal.        Behavior: Behavior normal.            Assessment & Plan:

## 2019-09-01 NOTE — Assessment & Plan Note (Signed)
GFR stable Is on ACEI

## 2019-09-01 NOTE — Progress Notes (Signed)
Hearing Screening   125Hz  250Hz  500Hz  1000Hz  2000Hz  3000Hz  4000Hz  6000Hz  8000Hz   Right ear:   20 25 25   0    Left ear:   20 25 0  0    Vision Screening Comments: Around September 2020

## 2019-09-01 NOTE — Assessment & Plan Note (Signed)
BP Readings from Last 3 Encounters:  09/01/19 124/80  08/13/19 122/80  07/16/19 (!) 163/81   Good control on lisinopril Recent blood work was fine

## 2019-09-01 NOTE — Assessment & Plan Note (Signed)
Continues on B12 injections

## 2019-09-01 NOTE — Assessment & Plan Note (Signed)
Seen on recent CT scan Discussed statin---will hold off

## 2019-09-01 NOTE — Assessment & Plan Note (Signed)
Chronic pain that doesn't seem to be from gallstones Bowels regular with linzess but still with cramping/pressure, etc Will try hyoscyamine

## 2019-09-01 NOTE — Assessment & Plan Note (Signed)
I have personally reviewed the Medicare Annual Wellness questionnaire and have noted 1. The patient's medical and social history 2. Their use of alcohol, tobacco or illicit drugs 3. Their current medications and supplements 4. The patient's functional ability including ADL's, fall risks, home safety risks and hearing or visual             impairment. 5. Diet and physical activities 6. Evidence for depression or mood disorders  The patients weight, height, BMI and visual acuity have been recorded in the chart I have made referrals, counseling and provided education to the patient based review of the above and I have provided the pt with a written personalized care plan for preventive services.  I have provided you with a copy of your personalized plan for preventive services. Please take the time to review along with your updated medication list.  Discussed exercise Done with cancer screening Td if any injury Flu vaccine in the fall

## 2019-09-01 NOTE — Assessment & Plan Note (Signed)
See social history 

## 2019-09-10 ENCOUNTER — Ambulatory Visit: Payer: Medicare Other | Admitting: Urology

## 2019-09-15 ENCOUNTER — Ambulatory Visit: Payer: Medicare Other | Admitting: Urology

## 2019-09-15 ENCOUNTER — Other Ambulatory Visit: Payer: Self-pay

## 2019-09-15 ENCOUNTER — Encounter: Payer: Self-pay | Admitting: Urology

## 2019-09-15 DIAGNOSIS — N2889 Other specified disorders of kidney and ureter: Secondary | ICD-10-CM | POA: Diagnosis not present

## 2019-09-15 NOTE — Progress Notes (Signed)
09/15/2019 1:59 PM   Darren Atkins 10-15-1940 [id number]  Referring provider: Venia Carbon, MD [address] [place],  [place] [address] No chief complaint on file.   HPI: Darren Atkins is a 79 y.o. male presents today to be evaluated for erectile dysfunction and recent finding of a renal mass on CT scan.  He recently underwent a CT abdomen at [facility] for unrelated reasons, right upper quadrant pain.  CT A/P with contrast on 08/04/2019 showed a mass arising from the upper pole of the left kidney medially measuring 1.7 x 1.5 cm. The attenuation of this mass is higher than is expected with a simple cyst. There are several simple cysts elsewhere in the left kidney, largest measuring 2.0 x 1.8 cm. There is a mass arising from the lateral aspect of the lower pole the right kidney measuring 2.1 x 2.0 cm which has attenuation values higher than is expected with a cyst. Several cysts are noted on the right as well with the largest cyst arising somewhat eccentrically from the medial right kidney measuring 4.5 x 2.4 cm. There is no evident hydronephrosis on either side. There is no renal or ureteral calculus on either side. Urinary bladder is midline with wall thickness within normal limits.   Denies flank pain. No hematuria.   No hx of cardiovascular disease.    PMH: Past Medical History:  Diagnosis Date  . Allergy   . Anemia   . Hypertension   . Osteoarthritis     Surgical History: Past Surgical History:  Procedure Laterality Date  . COLONOSCOPY WITH PROPOFOL N/A 08/05/2018   Procedure: COLONOSCOPY WITH PROPOFOL;  Surgeon: Virgel Manifold, MD;  Location: [facility] ENDOSCOPY;  Service: Endoscopy;  Laterality: N/A;  . [place]. Myoview- apical thinning, no ischemia EF 53% 02/08    Home Medications:  Allergies as of 09/15/2019      Reactions   Dapsone Other (See Comments)   Hydrochlorothiazide Other (See Comments)   Hydrochlorothiazide  W-triamterene    REACTION: Palps REACTION: Palps   Naprosyn [naproxen] Other (See Comments)   Insomnia, stomach upset   Sulfa Antibiotics    REACTION: unspecified   Sulfonamide Derivatives    REACTION: unspecified      Medication List       Accurate as of September 15, 2019  1:59 PM. If you have any questions, ask your nurse or doctor.        amLODipine 10 MG tablet Commonly known as: NORVASC TAKE 1 TABLET BY MOUTH ONCE DAILY . APPOINTMENT REQUIRED FOR FUTURE REFILLS   cyanocobalamin 1000 MCG/ML injection Commonly known as: (VITAMIN B-12) INJECT 1ML INTRAMUSCULARLY ONCE EVERY 30 DAYS   folic acid 1 MG tablet Commonly known as: FOLVITE Take 1 tablet by mouth once daily   hyoscyamine 0.125 MG Tbdp disintergrating tablet Commonly known as: ANASPAZ Place 1 tablet (0.125 mg total) under the tongue 3 (three) times daily as needed for cramping.   linaclotide 145 MCG Caps capsule Commonly known as: LINZESS Take 1 capsule (145 mcg total) by mouth daily before breakfast.   lisinopril 10 MG tablet Commonly known as: ZESTRIL Take 1 tablet by mouth once daily   omeprazole 40 MG capsule Commonly known as: PRILOSEC Take 1 capsule (40 mg total) by mouth 2 (two) times daily. Can decrease to daily if symptoms improve   ondansetron 4 MG disintegrating tablet Commonly known as: ZOFRAN-ODT Take 4 mg by mouth every 8 (eight)  hours as needed.   sildenafil 20 MG tablet Commonly known as: Revatio Take 1 tablet (20 mg total) by mouth as needed. Take 1-5 tabs as needed prior to intercourse   tadalafil 20 MG tablet Commonly known as: Cialis Take 1 tablet (20 mg total) by mouth every three (3) days as needed for erectile dysfunction.       Allergies:  Allergies  Allergen Reactions  . Dapsone Other (See Comments)  . Hydrochlorothiazide Other (See Comments)  . Hydrochlorothiazide W-Triamterene     REACTION: Palps REACTION: Palps  . Naprosyn [Naproxen] Other (See Comments)     Insomnia, stomach upset  . Sulfa Antibiotics     REACTION: unspecified  . Sulfonamide Derivatives     REACTION: unspecified    Family History: Family History  Problem Relation Age of Onset  . Hypertension Mother   . Osteoporosis Mother   . Hypertension Father   . Coronary artery disease Neg Hx   . Diabetes Neg Hx   . Cancer Neg Hx     Social History:  reports that he has never smoked. He quit smokeless tobacco use about 36 years ago.  His smokeless tobacco use included chew. He reports previous drug use. He reports that he does not drink alcohol.   Physical Exam: BP 129/75   Pulse 83   Ht 5\' 11"  (1.803 m)   Wt 183 lb (83 kg)   BMI 25.52 kg/m   Constitutional:  Alert and oriented, No acute distress. HEENT: [place] AT, moist mucus membranes.  Trachea midline, no masses. Cardiovascular: No clubbing, cyanosis, or edema. Respiratory: Normal respiratory effort, no increased work of breathing. Skin: No rashes, bruises or suspicious lesions. Neurologic: Grossly intact, no focal deficits, moving all 4 extremities. Psychiatric: Normal mood and affect.   Pertinent Imaging:  Interface, Rad Results In - 08/04/2019 9:51 AM EDT  Formatting of this note might be different from the original.  CLINICAL DATA: Upper abdominal pain   EXAM:  CT ABDOMEN AND PELVIS WITH CONTRAST   TECHNIQUE:  Multidetector CT imaging of the abdomen and pelvis was performed  using the standard protocol following bolus administration of  intravenous contrast.   CONTRAST: 100 mL Isovue-300 nonionic   COMPARISON: Ultrasound right upper quadrant August 04, 2019   FINDINGS:  Lower chest: There is scarring in the lung bases. There is mild  posterior right base atelectasis. There is no lung base edema or  airspace opacity.   Hepatobiliary: No focal liver lesions are evident. Gallbladder  appears mildly distended with cholelithiasis. By CT, the gallbladder  wall does not appear overtly thickened. There is  no appreciable  biliary duct dilatation.   Pancreas: There is no appreciable pancreatic mass or inflammatory  focus.   Spleen: No splenic lesions are evident.   Adrenals/Urinary Tract: Adrenals bilaterally appear unremarkable.  There is a mass arising from the upper pole of the left kidney  medially measuring 1.7 x 1.5 cm. The attenuation of this mass is  higher than is expected with a simple cyst. There are several simple  cysts elsewhere in theleft kidney, largest measuring 2.0 x 1.8 cm.  There is a mass arising from the lateral aspect of the lower pole  the right kidney measuring 2.1 x 2.0 cm which has attenuation values  higher than is expected with a cyst. Several cysts are noted on the  right as well with the largest cyst arising somewhat eccentrically  from the medial right kidney measuring 4.5 x 2.4 cm. There  is no  evident hydronephrosis on either side. There is no renal or ureteral  calculus on either side. Urinary bladder is midlinewith wall  thickness within normal limits.   Stomach/Bowel: There are multiple sigmoid diverticula as well as  more scattered descending colonic diverticula. No diverticulitis is  evident on this study. There is no evident bowel obstruction. The  terminal ileum appears unremarkable. There is no appreciable free  air or portal venous air.   Vascular/Lymphatic: There is no abdominal aortic aneurysm. There is  aortic atherosclerotic plaque as well as foci of atherosclerosis in  each common iliac artery.Major venous structures appear patent.  There is no appreciable adenopathy in the abdomen or pelvis.   Reproductive: Prostate and seminal vesicles are normal in size and  contour. No evident pelvic mass. There is fat in the left inguinal  ring.   Other: No periappendiceal region inflammation. No abscess or ascites  is evident in the abdomen or pelvis.   Musculoskeletal: Degenerative change noted in the lumbar spine. No  blastic or lytic  bone lesions. No intramuscular or abdominal wall  lesions are evident.   IMPRESSION:  1. Cholelithiasis with mild distention of the gallbladder.  Gallbladder wall does not appear overtly thickened by CT. No  appreciable biliary duct dilatation.   2. There is a noncystic renal mass on each side, with mass on the  right slightly larger than mass on the left. These renal lesions  warrant additional surveillance given potential for small renal cell  carcinomas in these areas. Further evaluation with pre and post  contrast MRI or CT nonemergently should be considered.MR would be  the optimal study of choice for further evaluation in this regard.   3. Aortic Atherosclerosis (ICD10-I70.0).   4. No bowel obstruction. Extensive left colonic diverticulosis  without diverticulitis evident. No abscess in the abdomen or pelvis.  Periappendiceal region appears normal.   5. No renal or ureteral calculus on either side. No evident  hydronephrosis. Urinary bladder wall thickness normal.    Electronically Signed   By: Lowella Grip III M.D.   On: 08/04/2019 09:49   I have personally reviewed the images and agree with radiologist interpretation.    Assessment & Plan:    1. ED On Sildenafil.   2. Left/ right renal mass vs cyst Based on CT A/P results stated above I have advised patient to undergo abdominal MRI w/w/o constrast.  We discussed the differential diagnosis which includes renal cyst, complicated cyst such as proteinaceous or hemorrhagic, more complicated Bosniak 3 or 4 cyst as well as a solid renal mass.  Further characterization is needed as above.  -MR abd with and without  Patient is in agreement.  [facility] [address] [place], [place] [address] [phone]  I, Selena Batten, am acting as a scribe for Dr. Hollice Espy.  I have reviewed the above documentation for accuracy and completeness, and I agree with the  above.   Hollice Espy, MD

## 2019-09-27 ENCOUNTER — Ambulatory Visit: Payer: Medicare Other | Admitting: Family Medicine

## 2019-10-14 ENCOUNTER — Other Ambulatory Visit: Payer: Self-pay | Admitting: Internal Medicine

## 2019-10-15 ENCOUNTER — Other Ambulatory Visit: Payer: Self-pay

## 2019-10-15 ENCOUNTER — Ambulatory Visit
Admission: RE | Admit: 2019-10-15 | Discharge: 2019-10-15 | Disposition: A | Discharge status: Home / Self Care | Payer: Medicare Other | Source: Ambulatory Visit | Attending: Urology | Admitting: Urology

## 2019-10-15 DIAGNOSIS — N281 Cyst of kidney, acquired: Secondary | ICD-10-CM | POA: Diagnosis not present

## 2019-10-15 DIAGNOSIS — N2889 Other specified disorders of kidney and ureter: Secondary | ICD-10-CM | POA: Diagnosis not present

## 2019-10-15 DIAGNOSIS — K8020 Calculus of gallbladder without cholecystitis without obstruction: Secondary | ICD-10-CM | POA: Diagnosis not present

## 2019-10-15 IMAGING — MR MR ABDOMEN WO/W CM
17 of 18 series · 47 of 48 positions shown · IV contrast (8ml Gadavist)
Comparison: CT [DATE]

CLINICAL DATA: Indeterminate renal lesion on CT. MRI recommended
for further evaluation

EXAM:
MRI ABDOMEN WITHOUT AND WITH CONTRAST
TECHNIQUE: Multiplanar multisequence MR imaging of the abdomen was performed
both before and after the administration of intravenous contrast.
CONTRAST:  8mL GADAVIST GADOBUTROL 1 MMOL/ML IV SOLN

[Series 3: T2 · coronal · 6.0mm · 1.19mm/px · 2 of 35 slices shown (1 of 2)]
[im 1/35]
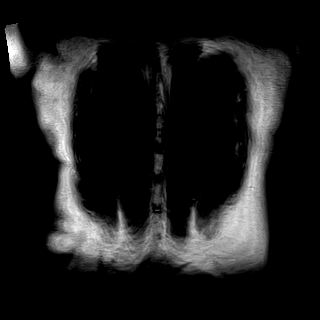
[im 35/35]
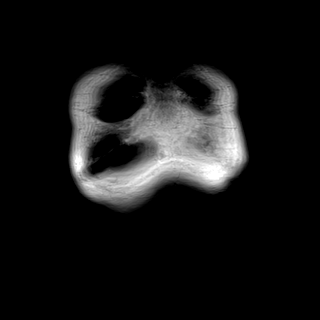

[Series 4: T2 · axial · 6.0mm · 1.19mm/px · z∈[-88,+135]mm · 2 of 32 slices shown (2 of 2)]
[im 1/32]
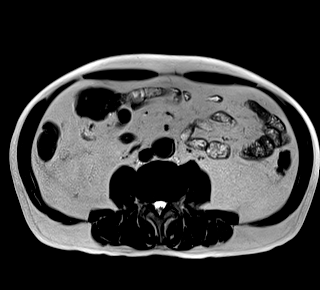
[im 32/32]
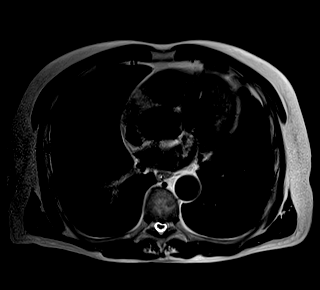

[Series 6: T2 fat-sat · axial · 6.0mm · 1.19mm/px · z∈[-95,+142]mm · 2 of 34 slices shown]
[im 1/34]
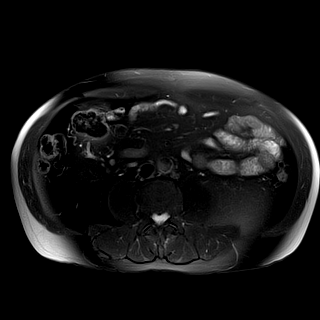
[im 34/34]
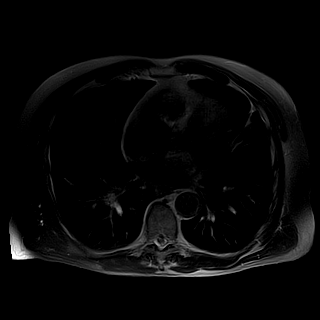

[Series 7: ax dwi_tracew · axial · 6.0mm · 1.42mm/px · z∈[-95,+142]mm · 5 of 102 slices shown]
[im 1/102]
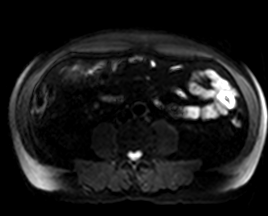
[im 26/102]
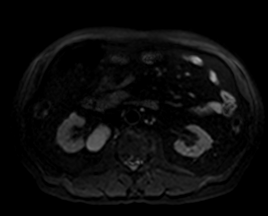
[im 51/102]
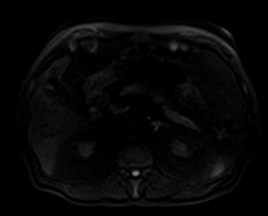
[im 76/102]
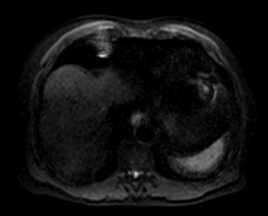
[im 102/102]
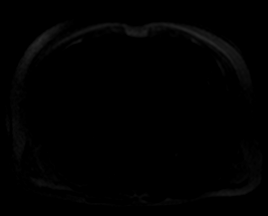

[Series 8: ax dwi_adc · axial · 6.0mm · 1.42mm/px · z∈[-95,+142]mm · 2 of 34 slices shown]
[im 1/34]
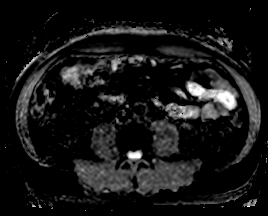
[im 34/34]
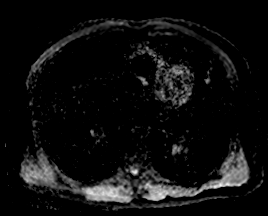

[Series 9: T1 · axial · 6.0mm · 0.74mm/px · z∈[-88,+135]mm · 3 of 64 slices shown]
[im 1/64]
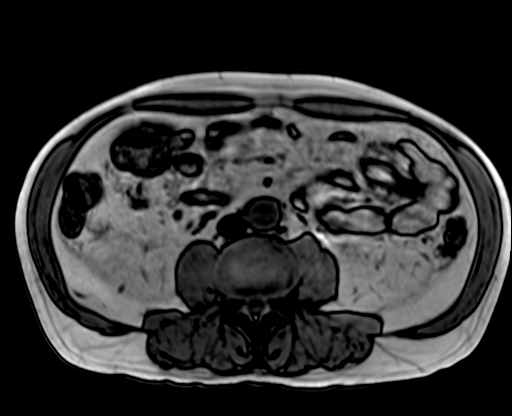
[im 32/64]
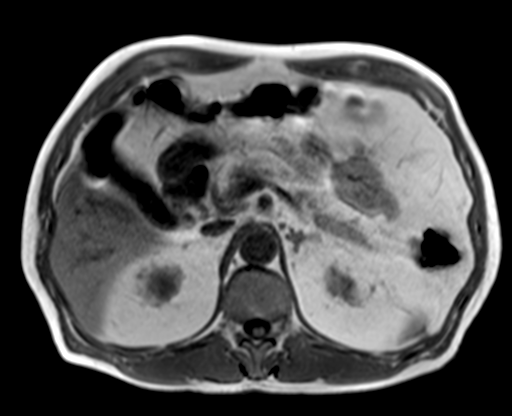
[im 64/64]
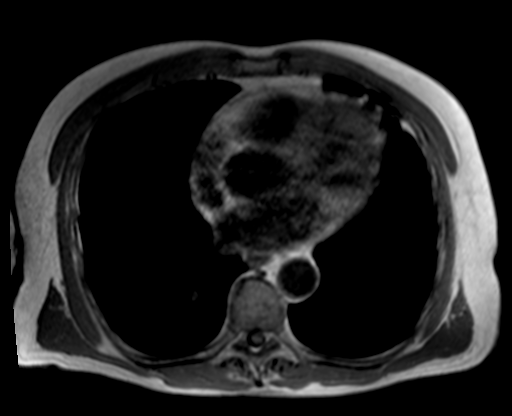

[Series 10: bSSFP · axial · 6.0mm · 0.74mm/px · 1 of 32 slices shown]
[im 1/32]
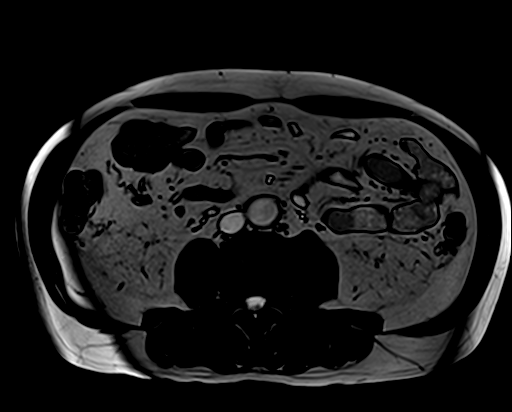

[Series 11: T1 dynamic fat-sat · axial · non-contrast · 3.0mm · 1.19mm/px · z∈[-95,+142]mm · 3 of 80 slices shown (1 of 5)]
[im 1/80]
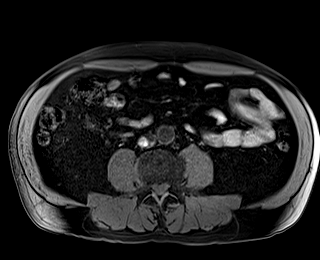
[im 40/80]
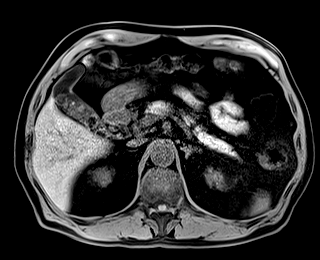
[im 80/80]
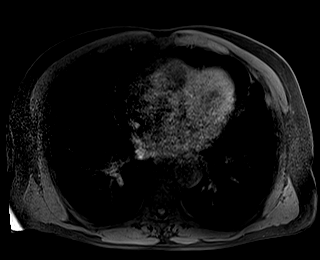

[Series 12: T1 dynamic fat-sat post-contrast · axial · 3.0mm · 1.19mm/px · z∈[-95,+142]mm · 3 of 80 slices shown (1 of 4)]
[im 1/80]
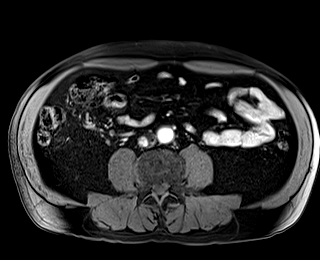
[im 40/80]
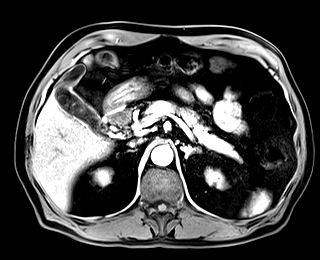
[im 80/80]
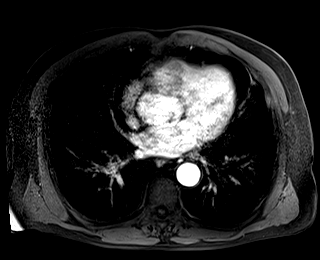

[Series 13: T1 dynamic fat-sat · axial · 3.0mm · 1.19mm/px · z∈[-95,+142]mm · 3 of 80 slices shown (2 of 5)]
[im 1/80]
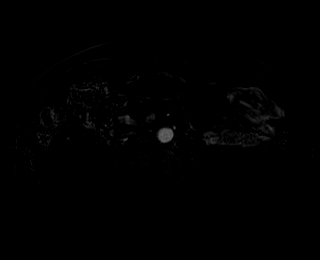
[im 40/80]
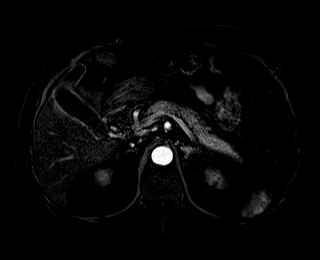
[im 80/80]
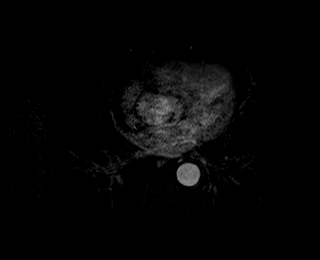

[Series 14: T1 dynamic fat-sat post-contrast · axial · 3.0mm · 1.19mm/px · z∈[-95,+142]mm · 3 of 80 slices shown (2 of 4)]
[im 1/80]
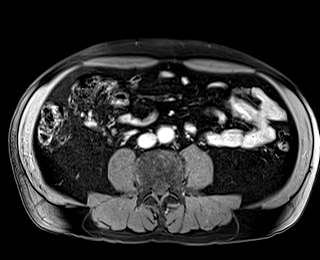
[im 40/80]
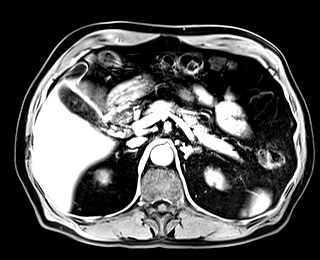
[im 80/80]
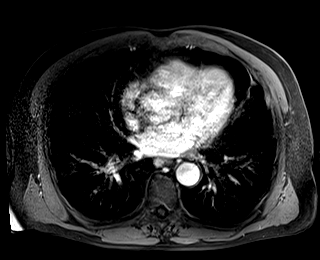

[Series 15: T1 dynamic fat-sat · axial · 3.0mm · 1.19mm/px · z∈[-95,+142]mm · 3 of 80 slices shown (3 of 5)]
[im 1/80]
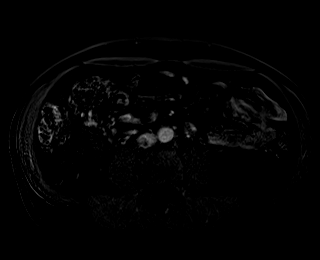
[im 40/80]
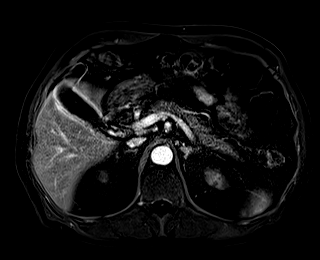
[im 80/80]
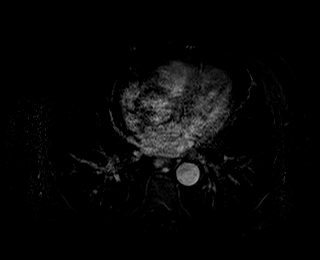

[Series 16: T1 dynamic fat-sat post-contrast · axial · 3.0mm · 1.19mm/px · z∈[-95,+142]mm · 3 of 80 slices shown (3 of 4)]
[im 1/80]
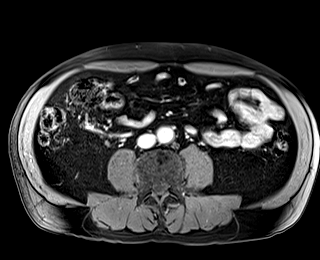
[im 40/80]
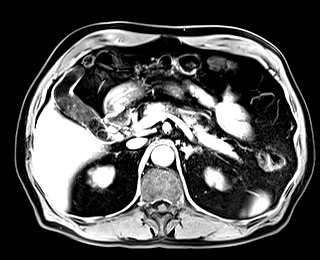
[im 80/80]
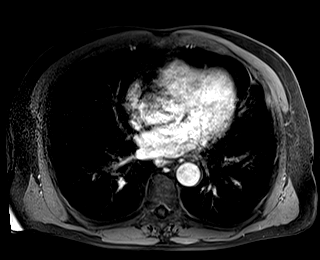

[Series 17: T1 dynamic fat-sat · axial · 3.0mm · 1.19mm/px · z∈[-95,+142]mm · 3 of 80 slices shown (4 of 5)]
[im 1/80]
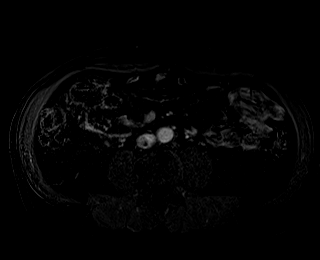
[im 40/80]
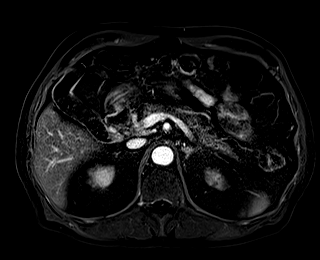
[im 80/80]
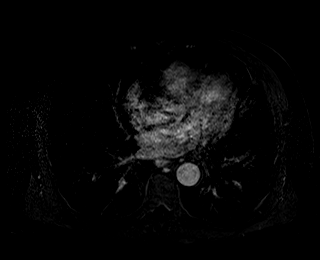

[Series 18: T1 dynamic post-contrast · coronal · 3.0mm · 1.31mm/px · 3 of 72 slices shown]
[im 1/72]
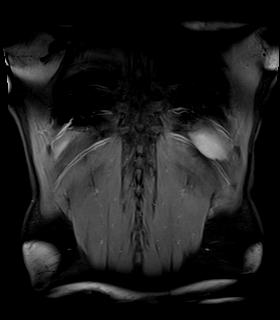
[im 36/72]
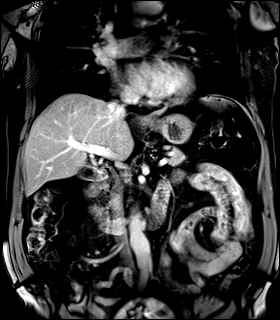
[im 72/72]
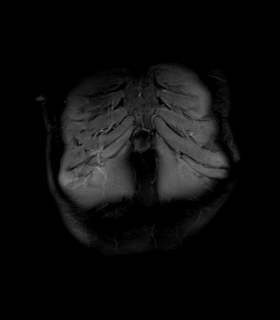

[Series 19: T1 dynamic fat-sat post-contrast · axial · 3.0mm · 1.19mm/px · z∈[-95,+142]mm · 3 of 80 slices shown (4 of 4)]
[im 1/80]
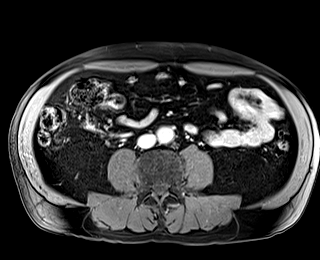
[im 40/80]
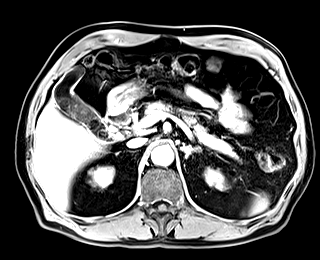
[im 80/80]
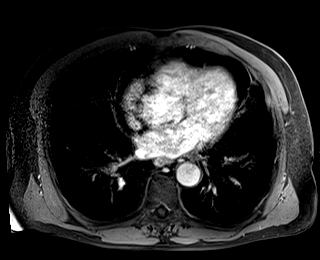

[Series 20: T1 dynamic fat-sat · axial · 3.0mm · 1.19mm/px · z∈[-95,+142]mm · 3 of 80 slices shown (5 of 5)]
[im 1/80]
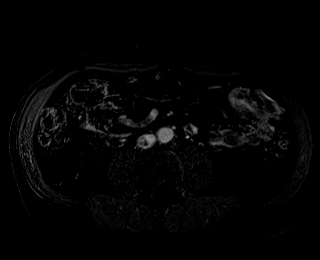
[im 40/80]
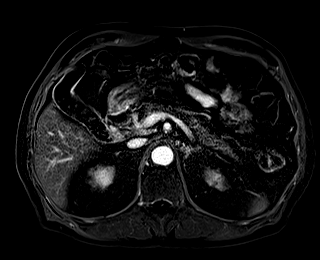
[im 80/80]
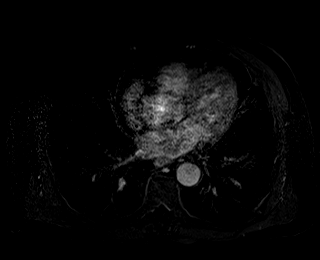

[47 of 48 positions shown; findings below may reference images not displayed]

FINDINGS: Lower chest:  Lung bases are clear.

Hepatobiliary: Multiple gallstones fill the lumen the gallbladder.
No gallbladder inflammation. Common bile duct normal caliber. No
focal hepatic lesion.

Pancreas: Normal pancreatic parenchymal intensity. No ductal
dilatation or inflammation.

Spleen: Normal spleen.

Adrenals/urinary tract: Adrenal glands normal.

The lesion of concern exophytic from the lower pole of the RIGHT
kidney is again demonstrated. This lesion is hyperintense on
precontrast T1 weighted imaging suggesting internal blood product.
The lesion measures 2.1 by 1.7 cm on image 68/11. Lesion
predominantly low signal intensity on T2 weighted imaging with a
peripheral nodular component. The peripheral nodular component
measures 11 mm best seen on coronal image [DATE]. The majority of the
lesion is nonenhancing however the peripheral nodular component does
demonstrate enhancement on postcontrast imaging noted on the
subtraction series 13 as well as the 90 second delay series (image
66/16). Small enhancing nodules also well seen on image
[DATE]/coronal.

The lesion of concern in the medial aspect of the LEFT kidney is
consistent with an nonenhancing hemorrhagic cysts. (Image 48/17).
Additional bilateral nonenhancing Bosniak 1 simple renal cysts.

Stomach/Bowel: Stomach and limited of the small bowel is
unremarkable

Vascular/Lymphatic: Abdominal aortic normal caliber. No
retroperitoneal periportal lymphadenopathy.

Musculoskeletal: No aggressive osseous lesion
IMPRESSION: 1. Round hemorrhagic lesion extending from lower pole the RIGHT
kidney has an enhancing nodule therefore concerning for a RENAL
NEOPLASM.
2. Bosniak 2 hemorrhagic renal cyst of the LEFT kidney.
3. Additional bilateral Bosniak 1 renal cysts.
4. Multiple gallstones without evidence of cholecystitis.

## 2019-10-15 MED ORDER — GADOBUTROL 1 MMOL/ML IV SOLN
8.0000 mL | Freq: Once | INTRAVENOUS | Status: AC | PRN
  Administered 2019-10-15: 8 mL via INTRAVENOUS

## 2019-10-19 NOTE — Progress Notes (Signed)
10/20/2019 9:25 AM   Darren Atkins Jan 25, 1941 [id number]  Referring provider: Venia Carbon, MD [address] [place],  [place] [address] Chief Complaint  Patient presents with  . Follow-up    HPI: Darren Atkins is a 79 y.o. male who presents today for a follow up renal mass.  He recently underwent a CT abdomen at [facility] for unrelated reasons, right upper quadrant pain.  CT A/P with contrast on 08/04/2019 showed a mass arising from the upper pole of the left kidney medially measuring 1.7 x 1.5 cm. The attenuation of this mass is higher than is expected with a simple cyst. There are several simple cysts elsewhere in the left kidney, largest measuring 2.0 x 1.8 cm. There is a mass arising from the lateral aspect of the lower pole the right kidney measuring 2.1 x 2.0 cm which has attenuation values higher than is expected with a cyst. Several cysts are noted on the right as well with the largest cyst arising somewhat eccentrically from the medial right kidney measuring 4.5 x 2.4 cm. There is no evident hydronephrosis on either side. There is no renal or ureteral calculus on either side. Urinary bladder is midline with wall thickness within normal limits.   Abdominal MRI w/ w/o contrast from 10/15/2019 revealed round hemorrhagic lesion extending from lower pole the RIGHT kidney has an enhancing nodule therefore concerning for a RENAL NEOPLASM. Bosniak 2 hemorrhagic renal cyst of the LEFT kidney. Additional bilateral Bosniak 1 renal cysts. Multiple gallstones without evidence of cholecystitis.  No hx of cardiovascular disease or renal disease.  PMH: Past Medical History:  Diagnosis Date  . Allergy   . Anemia   . Hypertension   . Osteoarthritis     Surgical History: Past Surgical History:  Procedure Laterality Date  . COLONOSCOPY WITH PROPOFOL N/A 08/05/2018   Procedure: COLONOSCOPY WITH PROPOFOL;  Surgeon: Virgel Manifold, MD;  Location: [facility] ENDOSCOPY;   Service: Endoscopy;  Laterality: N/A;  . [place]. Myoview- apical thinning, no ischemia EF 53% 02/08    Home Medications:  Allergies as of 10/20/2019      Reactions   Dapsone Other (See Comments)   Hydrochlorothiazide Other (See Comments)   Hydrochlorothiazide W-triamterene    REACTION: Palps REACTION: Palps   Naprosyn [naproxen] Other (See Comments)   Insomnia, stomach upset   Sulfa Antibiotics    REACTION: unspecified   Sulfonamide Derivatives    REACTION: unspecified      Medication List       Accurate as of October 20, 2019  9:25 AM. If you have any questions, ask your nurse or doctor.        STOP taking these medications   sildenafil 20 MG tablet Commonly known as: Revatio Stopped by: Hollice Espy, MD   tadalafil 20 MG tablet Commonly known as: Cialis Stopped by: Hollice Espy, MD     TAKE these medications   amLODipine 10 MG tablet Commonly known as: NORVASC TAKE 1 TABLET BY MOUTH ONCE DAILY . APPOINTMENT REQUIRED FOR FUTURE REFILLS   cyanocobalamin 1000 MCG/ML injection Commonly known as: (VITAMIN B-12) INJECT 1ML INTRAMUSCULARLY ONCE EVERY 30 DAYS   folic acid 1 MG tablet Commonly known as: FOLVITE Take 1 tablet by mouth once daily   hyoscyamine 0.125 MG Tbdp disintergrating tablet Commonly known as: ANASPAZ Place 1 tablet (0.125 mg total) under the tongue 3 (three) times daily as needed for cramping.   linaclotide 145  MCG Caps capsule Commonly known as: LINZESS Take 1 capsule (145 mcg total) by mouth daily before breakfast.   lisinopril 10 MG tablet Commonly known as: ZESTRIL Take 1 tablet by mouth once daily   omeprazole 40 MG capsule Commonly known as: PRILOSEC Take 1 capsule (40 mg total) by mouth 2 (two) times daily. Can decrease to daily if symptoms improve   ondansetron 4 MG disintegrating tablet Commonly known as: ZOFRAN-ODT Take 4 mg by mouth every 8 (eight) hours as needed.       Allergies:  Allergies    Allergen Reactions  . Dapsone Other (See Comments)  . Hydrochlorothiazide Other (See Comments)  . Hydrochlorothiazide W-Triamterene     REACTION: Palps REACTION: Palps  . Naprosyn [Naproxen] Other (See Comments)    Insomnia, stomach upset  . Sulfa Antibiotics     REACTION: unspecified  . Sulfonamide Derivatives     REACTION: unspecified    Family History: Family History  Problem Relation Age of Onset  . Hypertension Mother   . Osteoporosis Mother   . Hypertension Father   . Coronary artery disease Neg Hx   . Diabetes Neg Hx   . Cancer Neg Hx     Social History:  reports that he has never smoked. He quit smokeless tobacco use about 36 years ago.  His smokeless tobacco use included chew. He reports previous drug use. He reports that he does not drink alcohol.   Physical Exam: BP 138/78   Pulse 84   Ht 6' (1.829 m)   Wt 183 lb (83 kg)   BMI 24.82 kg/m   Constitutional:  Alert and oriented, No acute distress.  Accompanied by wife.   HEENT: [place] AT, moist mucus membranes.  Trachea midline, no masses. Cardiovascular: No clubbing, cyanosis, or edema. Respiratory: Normal respiratory effort, no increased work of breathing. Skin: No rashes, bruises or suspicious lesions. Neurologic: Grossly intact, no focal deficits, moving all 4 extremities. Psychiatric: Normal mood and affect.   Pertinent Imaging: CLINICAL DATA:  Indeterminate renal lesion on CT. MRI recommended for further evaluation  EXAM: MRI ABDOMEN WITHOUT AND WITH CONTRAST  TECHNIQUE: Multiplanar multisequence MR imaging of the abdomen was performed both before and after the administration of intravenous contrast.  CONTRAST:  19mL GADAVIST GADOBUTROL 1 MMOL/ML IV SOLN  COMPARISON:  CT 08/04/2019  FINDINGS: Lower chest:  Lung bases are clear.  Hepatobiliary: Multiple gallstones fill the lumen the gallbladder. No gallbladder inflammation. Common bile duct normal caliber. No focal hepatic  lesion.  Pancreas: Normal pancreatic parenchymal intensity. No ductal dilatation or inflammation.  Spleen: Normal spleen.  Adrenals/urinary tract: Adrenal glands normal.  The lesion of concern exophytic from the lower pole of the RIGHT kidney is again demonstrated. This lesion is hyperintense on precontrast T1 weighted imaging suggesting internal blood product. The lesion measures 2.1 by 1.7 cm on image 68/11. Lesion predominantly low signal intensity on T2 weighted imaging with a peripheral nodular component. The peripheral nodular component measures 11 mm best seen on coronal image 19/3. The majority of the lesion is nonenhancing however the peripheral nodular component does demonstrate enhancement on postcontrast imaging noted on the subtraction series 13 as well as the 90 second delay series (image 66/16). Small enhancing nodules also well seen on image 15/18/coronal.  The lesion of concern in the medial aspect of the LEFT kidney is consistent with an nonenhancing hemorrhagic cysts. (Image 48/17). Additional bilateral nonenhancing Bosniak 1 simple renal cysts.  Stomach/Bowel: Stomach and limited of the small bowel is  unremarkable  Vascular/Lymphatic: Abdominal aortic normal caliber. No retroperitoneal periportal lymphadenopathy.  Musculoskeletal: No aggressive osseous lesion  IMPRESSION: 1. Round hemorrhagic lesion extending from lower pole the RIGHT kidney has an enhancing nodule therefore concerning for a RENAL NEOPLASM. 2. Bosniak 2 hemorrhagic renal cyst of the LEFT kidney. 3. Additional bilateral Bosniak 1 renal cysts. 4. Multiple gallstones without evidence of cholecystitis.   Electronically Signed   By: Suzy Bouchard M.D.   On: 10/15/2019 13:03  I have personally reviewed the images and agree with radiologist interpretation.    Assessment & Plan:    1. Right lower pole renal mass -Bosniak III/IV and highly suspicious for RCC.  -There are  no comparisons to access interval growth rate.  -I recommend surveillance and repeat MRI w/ w/o contrast  in 9 month given the relatively small size and the fact that cystic renal lesions tend to be more indolent.  -A solid renal mass raises the suspicion of primary renal malignancy.  We discussed this in detail and in regards to the spectrum of renal masses which includes cysts (pure cysts are considered benign), solid masses and everything in between. The risk of metastasis increases as the size of solid renal mass increases. In general, it is believed that the risk of metastasis for renal masses less than 3-4 cm is small (up to approximately 5%) based mainly on large retrospective studies. In some cases and especially in patients of older age and multiple comorbidities a surveillance approach may be appropriate. The treatment of solid renal masses includes: surveillance, cryoablation (percutaneous and laparoscopic) in addition to partial and complete nephrectomy (each with option of laparoscopic, robotic and open depending on appropriateness). Furthermore, nephrectomy appears to be an independent risk factor for the development of chronic kidney disease suggesting that nephron sparing approaches should be implored whenever feasible. We reviewed these options in context of the patients current situation as well as the pros and cons of each. For cystic renal masses, we reviewed the Bosniak classification and discussed that Bosniak 3 lesions harbor a 50% chance of malignancy whereas Bosniak 4 cysts have a solid and 90-95% are malignant in nature.    Follow up in 9 months with abdominal MRI   [place] [address], [place], [place] [address] [phone]  I, Selena Batten, am acting as a scribe for Dr. Hollice Espy.  I have reviewed the above documentation for accuracy and completeness, and I agree with the above.   Hollice Espy, MD  I spent 30  total minutes on the day of the encounter including pre-visit review of the medical record, face-to-face time with the patient, and post visit ordering of labs/imaging/tests.

## 2019-10-20 ENCOUNTER — Other Ambulatory Visit: Payer: Self-pay

## 2019-10-20 ENCOUNTER — Ambulatory Visit: Payer: Medicare Other | Admitting: Urology

## 2019-10-20 DIAGNOSIS — N2889 Other specified disorders of kidney and ureter: Secondary | ICD-10-CM

## 2019-11-17 ENCOUNTER — Ambulatory Visit: Payer: Self-pay | Admitting: Urology

## 2019-12-07 ENCOUNTER — Other Ambulatory Visit: Payer: Self-pay | Admitting: Internal Medicine

## 2019-12-22 ENCOUNTER — Other Ambulatory Visit: Payer: Self-pay | Admitting: Internal Medicine

## 2020-01-02 ENCOUNTER — Other Ambulatory Visit: Payer: Self-pay | Admitting: Internal Medicine

## 2020-01-21 ENCOUNTER — Other Ambulatory Visit: Payer: Self-pay | Admitting: Internal Medicine

## 2020-02-01 ENCOUNTER — Other Ambulatory Visit: Payer: Self-pay | Admitting: Internal Medicine

## 2020-03-02 ENCOUNTER — Telehealth: Payer: Self-pay | Admitting: Internal Medicine

## 2020-03-02 NOTE — Telephone Encounter (Signed)
Pt called in wanted to know if he can get a refill on tadalafl 20mg  to [facility] in [place] [address] [place], [place], [place] [address]  [facility]: [phone]

## 2020-03-03 MED ORDER — TADALAFIL 20 MG PO TABS
10.0000 mg | ORAL_TABLET | ORAL | 11 refills | Status: DC | PRN
Start: 2020-03-03 — End: 2020-09-06

## 2020-03-03 NOTE — Telephone Encounter (Signed)
Rx sent 

## 2020-04-11 ENCOUNTER — Other Ambulatory Visit: Payer: Self-pay | Admitting: Internal Medicine

## 2020-05-02 DIAGNOSIS — H8110 Benign paroxysmal vertigo, unspecified ear: Secondary | ICD-10-CM | POA: Diagnosis not present

## 2020-05-02 DIAGNOSIS — I452 Bifascicular block: Secondary | ICD-10-CM | POA: Diagnosis not present

## 2020-05-02 DIAGNOSIS — R404 Transient alteration of awareness: Secondary | ICD-10-CM | POA: Diagnosis not present

## 2020-05-02 DIAGNOSIS — K219 Gastro-esophageal reflux disease without esophagitis: Secondary | ICD-10-CM | POA: Diagnosis not present

## 2020-05-02 DIAGNOSIS — Z79899 Other long term (current) drug therapy: Secondary | ICD-10-CM | POA: Diagnosis not present

## 2020-05-02 DIAGNOSIS — R42 Dizziness and giddiness: Secondary | ICD-10-CM | POA: Diagnosis not present

## 2020-05-02 DIAGNOSIS — J310 Chronic rhinitis: Secondary | ICD-10-CM | POA: Diagnosis not present

## 2020-05-02 DIAGNOSIS — R54 Age-related physical debility: Secondary | ICD-10-CM | POA: Diagnosis not present

## 2020-05-02 DIAGNOSIS — R2681 Unsteadiness on feet: Secondary | ICD-10-CM | POA: Diagnosis not present

## 2020-05-02 DIAGNOSIS — I4510 Unspecified right bundle-branch block: Secondary | ICD-10-CM | POA: Diagnosis not present

## 2020-05-02 DIAGNOSIS — H8112 Benign paroxysmal vertigo, left ear: Secondary | ICD-10-CM | POA: Diagnosis not present

## 2020-05-02 DIAGNOSIS — I499 Cardiac arrhythmia, unspecified: Secondary | ICD-10-CM | POA: Diagnosis not present

## 2020-05-02 DIAGNOSIS — R6889 Other general symptoms and signs: Secondary | ICD-10-CM | POA: Diagnosis not present

## 2020-05-02 DIAGNOSIS — Z743 Need for continuous supervision: Secondary | ICD-10-CM | POA: Diagnosis not present

## 2020-05-02 DIAGNOSIS — G319 Degenerative disease of nervous system, unspecified: Secondary | ICD-10-CM | POA: Diagnosis not present

## 2020-05-02 DIAGNOSIS — Z20822 Contact with and (suspected) exposure to covid-19: Secondary | ICD-10-CM | POA: Diagnosis not present

## 2020-05-02 DIAGNOSIS — R112 Nausea with vomiting, unspecified: Secondary | ICD-10-CM | POA: Diagnosis not present

## 2020-05-02 DIAGNOSIS — Z882 Allergy status to sulfonamides status: Secondary | ICD-10-CM | POA: Diagnosis not present

## 2020-05-02 DIAGNOSIS — R269 Unspecified abnormalities of gait and mobility: Secondary | ICD-10-CM | POA: Diagnosis not present

## 2020-05-02 DIAGNOSIS — I10 Essential (primary) hypertension: Secondary | ICD-10-CM | POA: Diagnosis not present

## 2020-05-02 DIAGNOSIS — Z9181 History of falling: Secondary | ICD-10-CM | POA: Diagnosis not present

## 2020-05-03 DIAGNOSIS — I10 Essential (primary) hypertension: Secondary | ICD-10-CM | POA: Diagnosis not present

## 2020-05-03 DIAGNOSIS — K219 Gastro-esophageal reflux disease without esophagitis: Secondary | ICD-10-CM | POA: Diagnosis not present

## 2020-05-03 DIAGNOSIS — J310 Chronic rhinitis: Secondary | ICD-10-CM | POA: Diagnosis not present

## 2020-05-03 DIAGNOSIS — Z791 Long term (current) use of non-steroidal anti-inflammatories (NSAID): Secondary | ICD-10-CM | POA: Diagnosis not present

## 2020-05-04 DIAGNOSIS — R2681 Unsteadiness on feet: Secondary | ICD-10-CM | POA: Diagnosis not present

## 2020-05-04 DIAGNOSIS — R54 Age-related physical debility: Secondary | ICD-10-CM | POA: Diagnosis not present

## 2020-05-04 DIAGNOSIS — R2689 Other abnormalities of gait and mobility: Secondary | ICD-10-CM | POA: Diagnosis not present

## 2020-05-04 DIAGNOSIS — H8110 Benign paroxysmal vertigo, unspecified ear: Secondary | ICD-10-CM | POA: Diagnosis not present

## 2020-05-04 DIAGNOSIS — K219 Gastro-esophageal reflux disease without esophagitis: Secondary | ICD-10-CM | POA: Diagnosis not present

## 2020-05-04 DIAGNOSIS — H8112 Benign paroxysmal vertigo, left ear: Secondary | ICD-10-CM | POA: Diagnosis not present

## 2020-05-04 DIAGNOSIS — I10 Essential (primary) hypertension: Secondary | ICD-10-CM | POA: Diagnosis not present

## 2020-05-11 ENCOUNTER — Other Ambulatory Visit: Payer: Self-pay

## 2020-05-11 ENCOUNTER — Encounter: Payer: Self-pay | Admitting: Internal Medicine

## 2020-05-11 ENCOUNTER — Ambulatory Visit (INDEPENDENT_AMBULATORY_CARE_PROVIDER_SITE_OTHER): Payer: Medicare Other | Admitting: Internal Medicine

## 2020-05-11 VITALS — BP 132/84 | HR 80 | Temp 97.8°F | Ht 70.0 in | Wt 189.0 lb

## 2020-05-11 DIAGNOSIS — R42 Dizziness and giddiness: Secondary | ICD-10-CM | POA: Diagnosis not present

## 2020-05-11 MED ORDER — DIAZEPAM 5 MG PO TABS: 2.5000 mg | ORAL_TABLET | Freq: Three times a day (TID) | ORAL | 0 refills | Status: DC | PRN

## 2020-05-11 NOTE — Progress Notes (Signed)
Subjective:    Patient ID: Darren Atkins, male    DOB: 04/30/1940, 80 y.o.   MRN: [id number]  HPI Here with wife for hospital follow up This visit occurred during the SARS-CoV-2 public health emergency.  Safety protocols were in place, including screening questions prior to the visit, additional usage of staff PPE, and extensive cleaning of exam room while observing appropriate contact time as indicated for disinfecting solutions.   Sudden dizziness at breakfast table Vomited on way to bed Took meclizine--slept for an hour and felt better Then went out with wife for lunch Hit again that evening and didn't get better Couldn't tell which way he had to go  25 years ago--he felt "like it was going to shake me out of the bed" Always keeps meclizine around for minor flares  Called rescue ER evaluation--labs fine and CT negative Meclizine (50mg ) not helpful there Positive [facility] PT did Epley and this helped  Walking fine other than the 1 severe spell No headaches No weakness  Current Outpatient Medications on File Prior to Visit  Medication Sig Dispense Refill  . amLODipine (NORVASC) 10 MG tablet Take 1 tablet (10 mg total) by mouth daily. 90 tablet 3  . cyanocobalamin (,VITAMIN B-12,) 1000 MCG/ML injection INJECT 1 ML  ONCE EVERY MONTH 3 mL 3  . folic acid (FOLVITE) 1 MG tablet Take 1 tablet by mouth once daily 90 tablet 3  . hyoscyamine (ANASPAZ) 0.125 MG TBDP disintergrating tablet TAKE 1 TABLET UNDER THE TONGUE THREE TIMES DAILY AS NEEDED FOR CRAMPS 60 tablet 0  . linaclotide (LINZESS) 145 MCG CAPS capsule Take 1 capsule (145 mcg total) by mouth daily before breakfast. 90 capsule 3  . lisinopril (ZESTRIL) 10 MG tablet Take 1 tablet by mouth once daily 90 tablet 3  . omeprazole (PRILOSEC) 40 MG capsule Take 1 capsule (40 mg total) by mouth 2 (two) times daily. Can decrease to daily if symptoms improve 180 capsule 3  . ondansetron (ZOFRAN-ODT) 4 MG disintegrating tablet Take 4  mg by mouth every 8 (eight) hours as needed.    . tadalafil (CIALIS) 20 MG tablet Take 0.5-1 tablets (10-20 mg total) by mouth every other day as needed for erectile dysfunction. 10 tablet 11   No current facility-administered medications on file prior to visit.    Allergies  Allergen Reactions  . Dapsone Other (See Comments)  . Hydrochlorothiazide Other (See Comments)  . Hydrochlorothiazide W-Triamterene     REACTION: Palps REACTION: Palps  . Naprosyn [Naproxen] Other (See Comments)    Insomnia, stomach upset  . Sulfa Antibiotics     REACTION: unspecified  . Sulfonamide Derivatives     REACTION: unspecified    Past Medical History:  Diagnosis Date  . Allergy   . Anemia   . Hypertension   . Osteoarthritis     Past Surgical History:  Procedure Laterality Date  . COLONOSCOPY WITH PROPOFOL N/A 08/05/2018   Procedure: COLONOSCOPY WITH PROPOFOL;  Surgeon: Virgel Manifold, MD;  Location: [facility] ENDOSCOPY;  Service: Endoscopy;  Laterality: N/A;  . [place]. Myoview- apical thinning, no ischemia EF 53% 02/08    Family History  Problem Relation Age of Onset  . Hypertension Mother   . Osteoporosis Mother   . Hypertension Father   . Coronary artery disease Neg Hx   . Diabetes Neg Hx   . Cancer Neg Hx     Social History   Socioeconomic History  . Marital  status: Married    Spouse name: Not on file  . Number of children: 1  . Years of education: Not on file  . Highest education level: Not on file  Occupational History  . Occupation: Retired---- Engineer, site for Film/video editor: RETIRED    Comment:    Tobacco Use  . Smoking status: Never Smoker  . Smokeless tobacco: Former Systems developer    Types: Secondary school teacher  . Vaping Use: Never used  Substance and Sexual Activity  . Alcohol use: No  . Drug use: Not Currently  . Sexual activity: Not on file  Other Topics Concern  . Not on file  Social History Narrative   Widowed 2020   Remarried 2021       No living will   No health care POA-- daughter would be the one.   Would accept resuscitation attempts but no prolonged artificial ventilation   Probably would accept tube feeds         Social Determinants of Health   Financial Resource Strain: Not on file  Food Insecurity: Not on file  Transportation Needs: Not on file  Physical Activity: Not on file  Stress: Not on file  Social Connections: Not on file  Intimate Partner Violence: Not on file   Review of Systems No recent illness No recent traveling Eating fine No tinnitus No change in hearing    Objective:   Physical Exam Constitutional:      Appearance: Normal appearance.  HENT:     Right Ear: Tympanic membrane, ear canal and external ear normal.     Left Ear: Tympanic membrane, ear canal and external ear normal.  Eyes:     Extraocular Movements: Extraocular movements intact.     Pupils: Pupils are equal, round, and reactive to light.     Comments: No clear nystagmus  Neurological:     Mental Status: He is alert.     Cranial Nerves: Cranial nerves are intact.     Motor: No weakness, tremor or abnormal muscle tone.     Coordination: Romberg sign negative. Coordination normal. Finger-Nose-Finger Test normal.     Comments: Very slight ataxia---stumbled slightly backwards on turn. Overall, slow but fairly stable Babinski absent            Assessment & Plan:

## 2020-05-11 NOTE — Assessment & Plan Note (Signed)
Seems to be vestibular [facility] records reviewed Much better now--so I don't think we need to proceed with MRI (though still small chance this was a stroke) Better after Epley maneuver--he was shown how to do this Has meclizine at home Will give diazepam to have on hand--just in case

## 2020-06-13 DIAGNOSIS — H8112 Benign paroxysmal vertigo, left ear: Secondary | ICD-10-CM | POA: Diagnosis not present

## 2020-06-15 DIAGNOSIS — H8112 Benign paroxysmal vertigo, left ear: Secondary | ICD-10-CM | POA: Diagnosis not present

## 2020-06-20 DIAGNOSIS — H8112 Benign paroxysmal vertigo, left ear: Secondary | ICD-10-CM | POA: Diagnosis not present

## 2020-06-27 DIAGNOSIS — H8112 Benign paroxysmal vertigo, left ear: Secondary | ICD-10-CM | POA: Diagnosis not present

## 2020-06-29 DIAGNOSIS — H8112 Benign paroxysmal vertigo, left ear: Secondary | ICD-10-CM | POA: Diagnosis not present

## 2020-07-04 DIAGNOSIS — H8112 Benign paroxysmal vertigo, left ear: Secondary | ICD-10-CM | POA: Diagnosis not present

## 2020-07-06 DIAGNOSIS — H8112 Benign paroxysmal vertigo, left ear: Secondary | ICD-10-CM | POA: Diagnosis not present

## 2020-07-11 DIAGNOSIS — H8112 Benign paroxysmal vertigo, left ear: Secondary | ICD-10-CM | POA: Diagnosis not present

## 2020-07-13 ENCOUNTER — Other Ambulatory Visit: Payer: Self-pay | Admitting: Internal Medicine

## 2020-07-24 ENCOUNTER — Other Ambulatory Visit: Payer: Self-pay

## 2020-07-24 ENCOUNTER — Ambulatory Visit
Admission: RE | Admit: 2020-07-24 | Discharge: 2020-07-24 | Disposition: A | Discharge status: Home / Self Care | Payer: Medicare Other | Source: Ambulatory Visit | Attending: Urology | Admitting: Urology

## 2020-07-24 DIAGNOSIS — N281 Cyst of kidney, acquired: Secondary | ICD-10-CM | POA: Diagnosis not present

## 2020-07-24 DIAGNOSIS — N2889 Other specified disorders of kidney and ureter: Secondary | ICD-10-CM | POA: Diagnosis not present

## 2020-07-24 DIAGNOSIS — K8020 Calculus of gallbladder without cholecystitis without obstruction: Secondary | ICD-10-CM | POA: Diagnosis not present

## 2020-07-24 IMAGING — MR MR ABDOMEN WO/W CM
18 series · 48 of 48 positions shown · IV contrast (gadavist)
Comparison: [DATE]

CLINICAL DATA: Follow-up renal lesion

EXAM:
MRI ABDOMEN WITHOUT AND WITH CONTRAST
TECHNIQUE: Multiplanar multisequence MR imaging of the abdomen was performed
both before and after the administration of intravenous contrast.
CONTRAST:  9mL GADAVIST GADOBUTROL 1 MMOL/ML IV SOLN

[Series 3: T2 · coronal · 6.0mm · 1.19mm/px · 2 of 32 slices shown (1 of 2)]
[im 1/32]
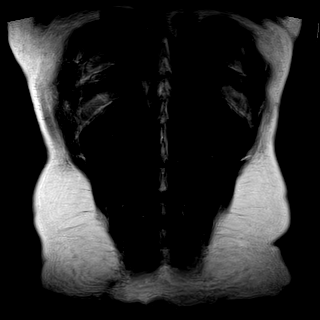
[im 32/32]
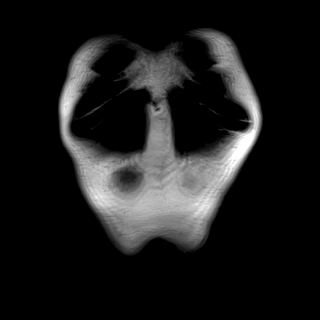

[Series 4: T2 · axial · 6.0mm · 1.19mm/px · z∈[-82,+142]mm · 2 of 32 slices shown (2 of 2)]
[im 1/32]
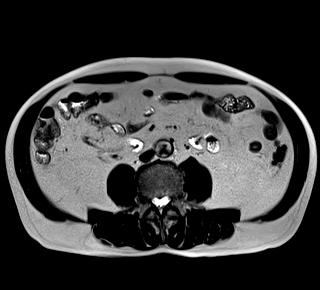
[im 32/32]
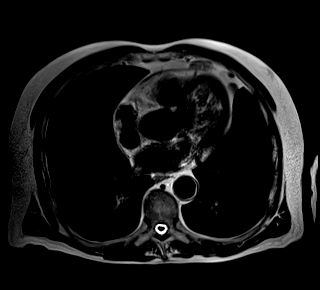

[Series 6: T2 fat-sat · axial · 6.0mm · 1.19mm/px · z∈[-75,+163]mm · 2 of 34 slices shown]
[im 1/34]
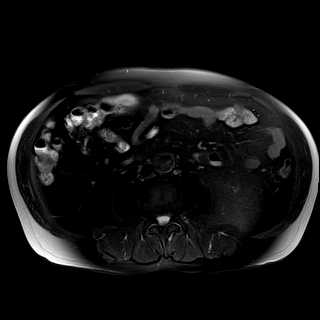
[im 34/34]
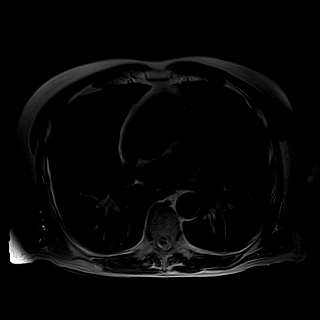

[Series 7: ax dwi_tracew · axial · 6.0mm · 1.42mm/px · z∈[-75,+163]mm · 4 of 102 slices shown]
[im 1/102]
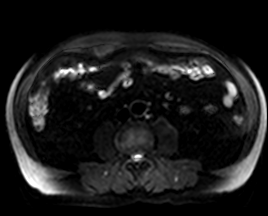
[im 34/102]
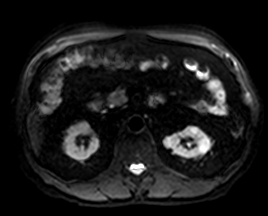
[im 68/102]
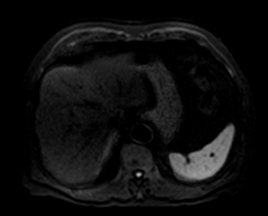
[im 102/102]
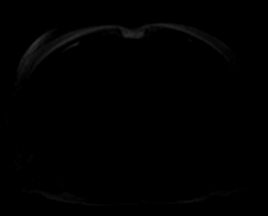

[Series 8: ax dwi_adc · axial · 6.0mm · 1.42mm/px · 1 of 34 slices shown]
[im 1/34]
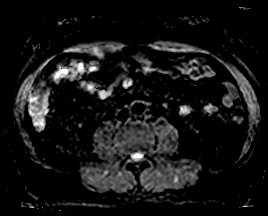

[Series 9: in & out · axial · 3.0mm · 1.19mm/px · z∈[-76,+137]mm · 3 of 72 slices shown (1 of 2)]
[im 1/72]
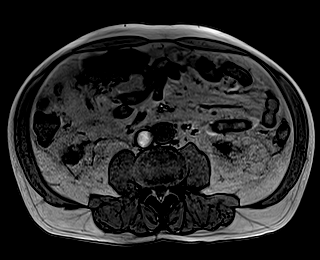
[im 36/72]
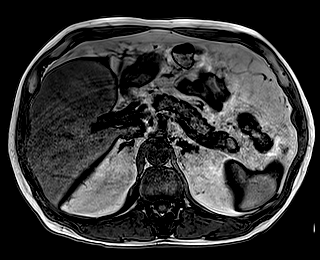
[im 72/72]
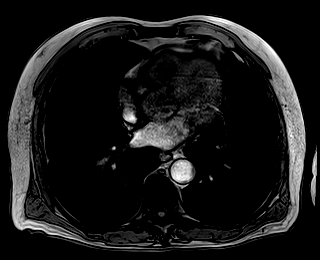

[Series 10: in & out · axial · 3.0mm · 1.19mm/px · z∈[-76,+137]mm · 3 of 72 slices shown (2 of 2)]
[im 1/72]
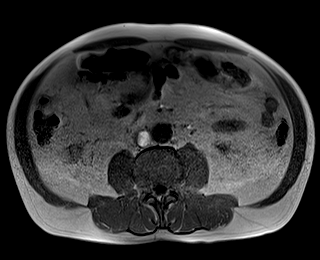
[im 36/72]
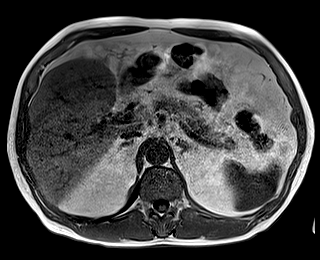
[im 72/72]
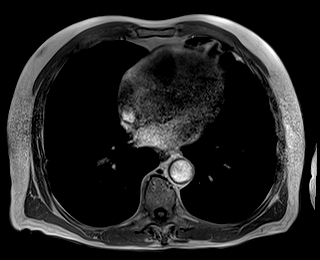

[Series 11: bSSFP · axial · 6.0mm · 0.74mm/px · 1 of 32 slices shown]
[im 1/32]
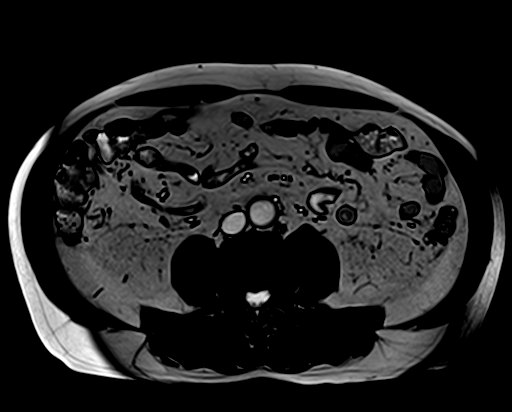

[Series 12: T1 dynamic fat-sat · axial · non-contrast · 3.0mm · 1.19mm/px · z∈[-88,+149]mm · 3 of 80 slices shown (1 of 5)]
[im 1/80]
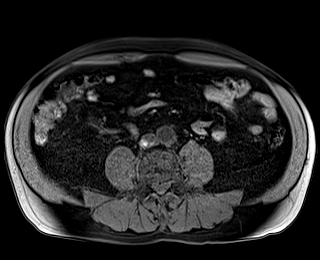
[im 40/80]
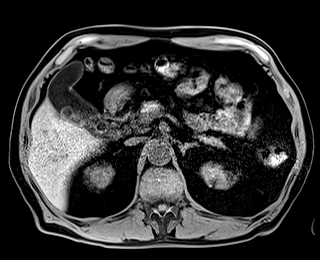
[im 80/80]
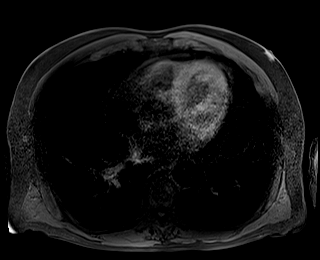

[Series 13: T1 dynamic fat-sat post-contrast · axial · 3.0mm · 1.19mm/px · z∈[-88,+149]mm · 3 of 80 slices shown (1 of 4)]
[im 1/80]
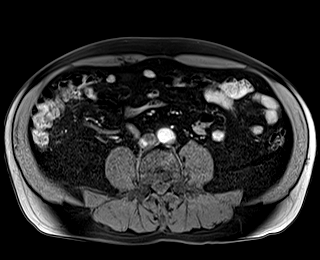
[im 40/80]
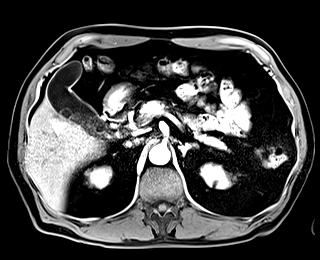
[im 80/80]
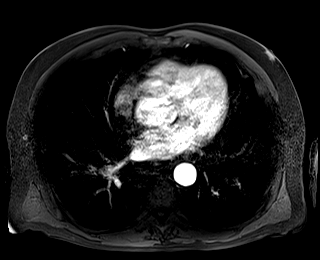

[Series 14: T1 dynamic fat-sat · axial · 3.0mm · 1.19mm/px · z∈[-88,+149]mm · 3 of 80 slices shown (2 of 5)]
[im 1/80]
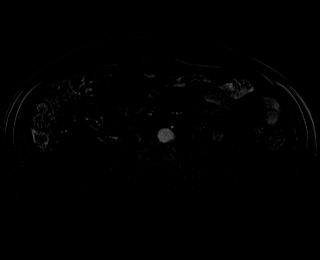
[im 40/80]
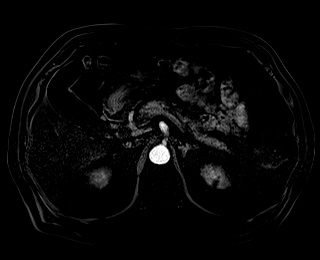
[im 80/80]
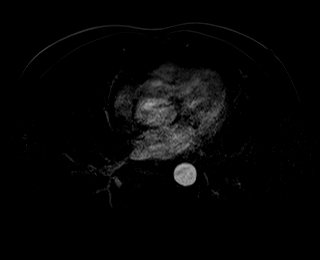

[Series 15: T1 dynamic fat-sat post-contrast · axial · 3.0mm · 1.19mm/px · z∈[-88,+149]mm · 3 of 80 slices shown (2 of 4)]
[im 1/80]
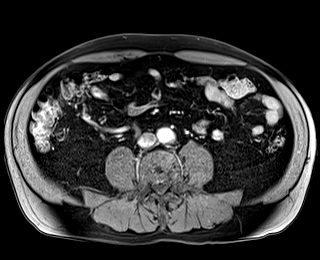
[im 40/80]
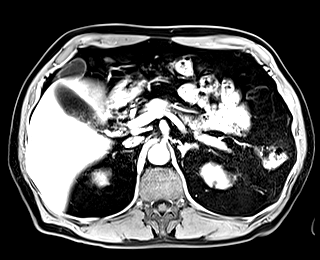
[im 80/80]
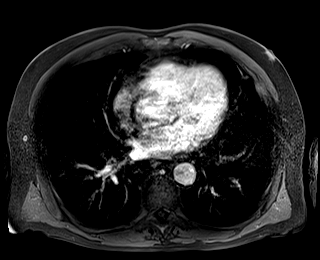

[Series 16: T1 dynamic fat-sat · axial · 3.0mm · 1.19mm/px · z∈[-88,+149]mm · 3 of 80 slices shown (3 of 5)]
[im 1/80]
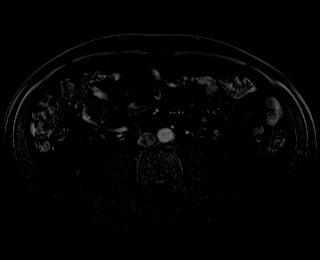
[im 40/80]
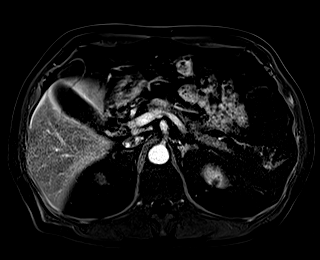
[im 80/80]
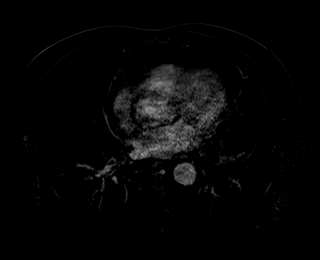

[Series 17: T1 dynamic fat-sat post-contrast · axial · 3.0mm · 1.19mm/px · z∈[-88,+149]mm · 3 of 80 slices shown (3 of 4)]
[im 1/80]
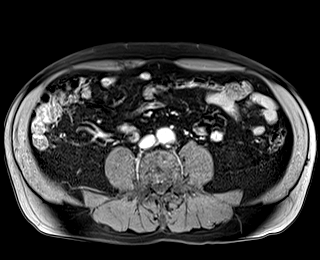
[im 40/80]
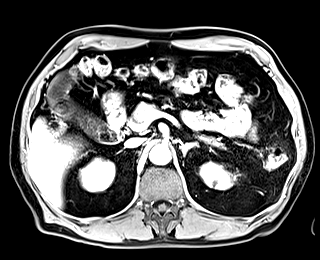
[im 80/80]
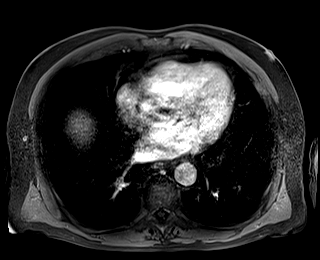

[Series 18: T1 dynamic fat-sat · axial · 3.0mm · 1.19mm/px · z∈[-88,+149]mm · 3 of 80 slices shown (4 of 5)]
[im 1/80]
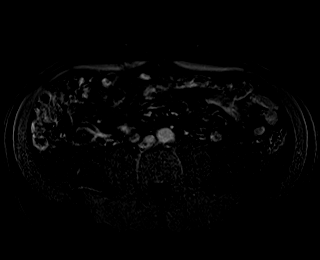
[im 40/80]
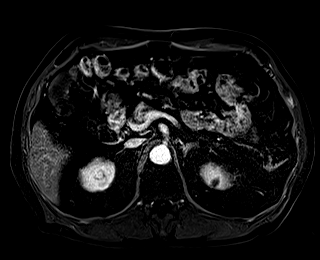
[im 80/80]
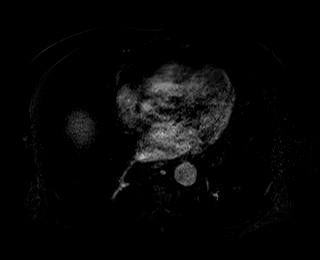

[Series 19: T1 dynamic post-contrast · coronal · 3.0mm · 1.31mm/px · 3 of 72 slices shown]
[im 1/72]
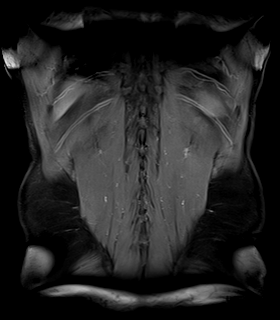
[im 36/72]
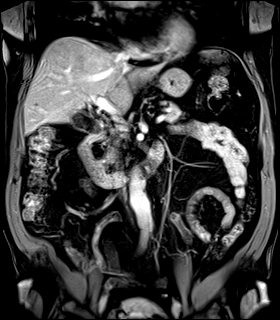
[im 72/72]
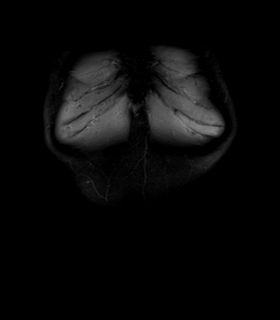

[Series 20: T1 dynamic fat-sat post-contrast · axial · 3.0mm · 1.19mm/px · z∈[-88,+149]mm · 3 of 80 slices shown (4 of 4)]
[im 1/80]
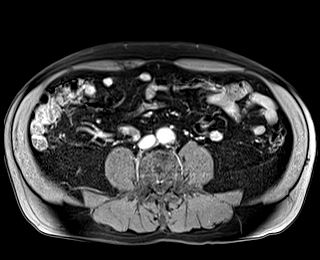
[im 40/80]
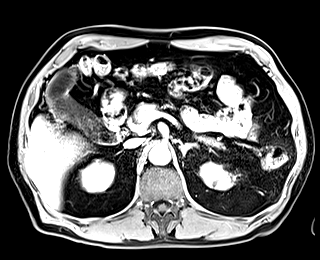
[im 80/80]
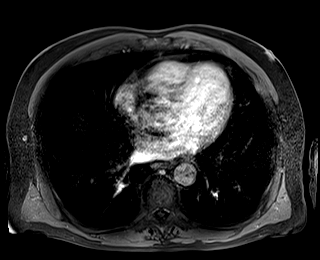

[Series 21: T1 dynamic fat-sat · axial · 3.0mm · 1.19mm/px · z∈[-88,+149]mm · 3 of 80 slices shown (5 of 5)]
[im 1/80]
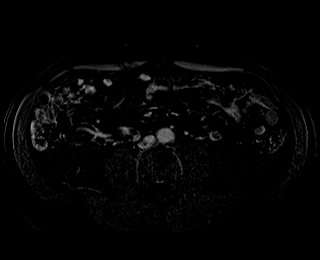
[im 40/80]
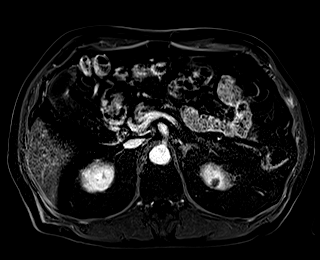
[im 80/80]
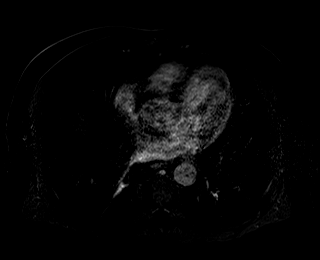

[48 of 48 positions shown; findings below may reference images not displayed]

FINDINGS: Lower chest: No acute findings.

Hepatobiliary: No mass or other parenchymal abnormality identified.
Numerous gallstones in the gallbladder. No biliary ductal
dilatation.

Pancreas: No mass, inflammatory changes, or other parenchymal
abnormality identified.

Spleen:  Within normal limits in size and appearance.

Adrenals/Urinary Tract: Redemonstrated exophytic mixed solid and
cystic lesion of the inferior pole of the right kidney, once again
demonstrating hemorrhagic or proteinaceous internal signal as well
as a a peripheral nodular component with contrast enhancement
measuring 1.1 cm (series 19, image 21). Multiple additional
bilateral simple, fluid signal cysts, some of which are hemorrhagic
or proteinaceous. No evidence of hydronephrosis.

Stomach/Bowel: Visualized portions within the abdomen are
unremarkable.

Vascular/Lymphatic: No pathologically enlarged lymph nodes
identified. No abdominal aortic aneurysm demonstrated.

Other:  None.

Musculoskeletal: No suspicious bone lesions identified.
IMPRESSION: 1. Redemonstrated exophytic mixed solid and cystic lesion of the
inferior pole of the right kidney, once again demonstrating
hemorrhagic or proteinaceous internal signal as well as a peripheral
nodular component with contrast enhancement measuring 1.1 cm. This
remains highly suspicious for a small renal cell carcinoma.
2. Additional definitively benign bilateral renal cysts.
3. Cholelithiasis.

## 2020-07-24 MED ORDER — GADOBUTROL 1 MMOL/ML IV SOLN
9.0000 mL | Freq: Once | INTRAVENOUS | Status: AC | PRN
  Administered 2020-07-24: 9 mL via INTRAVENOUS

## 2020-07-26 ENCOUNTER — Other Ambulatory Visit: Payer: Self-pay

## 2020-07-26 ENCOUNTER — Encounter: Payer: Self-pay | Admitting: Urology

## 2020-07-26 ENCOUNTER — Ambulatory Visit (INDEPENDENT_AMBULATORY_CARE_PROVIDER_SITE_OTHER): Payer: Medicare Other | Admitting: Urology

## 2020-07-26 VITALS — BP 134/86 | HR 84 | Ht 70.0 in | Wt 189.0 lb

## 2020-07-26 DIAGNOSIS — N281 Cyst of kidney, acquired: Secondary | ICD-10-CM

## 2020-07-26 DIAGNOSIS — N2889 Other specified disorders of kidney and ureter: Secondary | ICD-10-CM | POA: Diagnosis not present

## 2020-07-26 NOTE — Progress Notes (Signed)
07/26/2020 9:26 AM   Darren Atkins 03/29/1940 [id number]  Referring provider: Venia Carbon, MD [place],  [place] [address]  Chief Complaint  Patient presents with   Follow-up    HPI: 80 year old male who returns today for 47-month follow-up with MRI for suspicious renal mass on surveillance.  He recently underwent a CT abdomen at [facility] for unrelated reasons, right upper quadrant pain.   CT A/P with contrast on 08/04/2019 showed a mass arising from the upper pole of the left kidney medially measuring 1.7 x 1.5 cm. The attenuation of this mass is higher than is expected with a simple cyst. There are several simple cysts elsewhere in the left kidney, largest measuring 2.0 x 1.8 cm. There is a mass arising from the lateral aspect of the lower pole the right kidney measuring 2.1 x 2.0 cm which has attenuation values higher than is expected with a cyst. Several cysts are noted on the right as well with the largest cyst arising somewhat eccentrically from the medial right kidney measuring 4.5 x 2.4 cm. There is no evident hydronephrosis on either side. There is no renal or ureteral calculus on either side. Urinary bladder is midline with wall thickness within normal limits.    Abdominal MRI w/ w/o contrast from 10/15/2019 revealed round hemorrhagic lesion extending from lower pole the RIGHT kidney has an enhancing nodule therefore concerning for a RENAL NEOPLASM. Bosniak 2 hemorrhagic renal cyst of the LEFT kidney. Additional bilateral Bosniak 1 renal cysts. Multiple gallstones without evidence of cholecystitis.   Today, he returns with follow-up repeat MRI performed on 07/24/2020.  This shows stable unchanged suspicious lesion of mixed cystic solid lesion in the right lower pole with a nodular component measuring 1.1 cm.  Overall, this is essentially unchanged.  He continues to deny any flank pain, hematuria, weight loss or any other symptoms.   PMH: Past  Medical History:  Diagnosis Date   Allergy    Anemia    Hypertension    Osteoarthritis     Surgical History: Past Surgical History:  Procedure Laterality Date   COLONOSCOPY WITH PROPOFOL N/A 08/05/2018   Procedure: COLONOSCOPY WITH PROPOFOL;  Surgeon: Virgel Manifold, MD;  Location: [facility] ENDOSCOPY;  Service: Endoscopy;  Laterality: N/A;   [place]. Myoview- apical thinning, no ischemia EF 53% 02/08    Home Medications:  Allergies as of 07/26/2020       Reactions   Dapsone Other (See Comments)   Hydrochlorothiazide Other (See Comments)   Hydrochlorothiazide W-triamterene    REACTION: Palps REACTION: Palps   Naprosyn [naproxen] Other (See Comments)   Insomnia, stomach upset   Sulfa Antibiotics    REACTION: unspecified   Sulfonamide Derivatives    REACTION: unspecified        Medication List        Accurate as of July 26, 2020  9:26 AM. If you have any questions, ask your nurse or doctor.          STOP taking these medications    diazepam 5 MG tablet Commonly known as: VALIUM Stopped by: Hollice Espy, MD       TAKE these medications    amLODipine 10 MG tablet Commonly known as: NORVASC Take 1 tablet (10 mg total) by mouth daily.   cyanocobalamin 1000 MCG/ML injection Commonly known as: (VITAMIN B-12) INJECT 1 ML  ONCE EVERY MONTH   folic acid 1 MG tablet Commonly known  as: FOLVITE Take 1 tablet by mouth once daily   hyoscyamine 0.125 MG Tbdp disintergrating tablet Commonly known as: ANASPAZ TAKE 1 TABLET UNDER THE TONGUE THREE TIMES DAILY AS NEEDED FOR CRAMPS   linaclotide 145 MCG Caps capsule Commonly known as: LINZESS Take 1 capsule (145 mcg total) by mouth daily before breakfast.   lisinopril 10 MG tablet Commonly known as: ZESTRIL Take 1 tablet by mouth once daily   omeprazole 40 MG capsule Commonly known as: PRILOSEC TAKE 1 CAPSULE BY MOUTH TWICE DAILY. CAN DECREASE TO DAILY IF SYMPTOMS IMPROVE.   ondansetron 4  MG disintegrating tablet Commonly known as: ZOFRAN-ODT Take 4 mg by mouth every 8 (eight) hours as needed.   tadalafil 20 MG tablet Commonly known as: CIALIS Take 0.5-1 tablets (10-20 mg total) by mouth every other day as needed for erectile dysfunction.        Allergies:  Allergies  Allergen Reactions   Dapsone Other (See Comments)   Hydrochlorothiazide Other (See Comments)   Hydrochlorothiazide W-Triamterene     REACTION: Palps REACTION: Palps   Naprosyn [Naproxen] Other (See Comments)    Insomnia, stomach upset   Sulfa Antibiotics     REACTION: unspecified   Sulfonamide Derivatives     REACTION: unspecified    Family History: Family History  Problem Relation Age of Onset   Hypertension Mother    Osteoporosis Mother    Hypertension Father    Coronary artery disease Neg Hx    Diabetes Neg Hx    Cancer Neg Hx     Social History:  reports that he has never smoked. He quit smokeless tobacco use about 37 years ago.  His smokeless tobacco use included chew. He reports previous drug use. He reports that he does not drink alcohol.   Physical Exam: BP 134/86   Pulse 84   Ht 5\' 10"  (1.778 m)   Wt 189 lb (85.7 kg)   BMI 27.12 kg/m   Constitutional:  Alert and oriented, No acute distress. HEENT: [place] AT, moist mucus membranes.  Trachea midline, no masses. Cardiovascular: No clubbing, cyanosis, or edema. Respiratory: Normal respiratory effort, no increased work of breathing. Skin: No rashes, bruises or suspicious lesions. Neurologic: Grossly intact, no focal deficits, moving all 4 extremities. Psychiatric: Normal mood and affect.  Laboratory Data: Creatinine 1.4 on 05/03/20  Pertinent Imaging: IMPRESSION: 1. Redemonstrated exophytic mixed solid and cystic lesion of the inferior pole of the right kidney, once again demonstrating hemorrhagic or proteinaceous internal signal as well as a peripheral nodular component with contrast enhancement measuring 1.1 cm.  This remains highly suspicious for a small renal cell carcinoma. 2. Additional definitively benign bilateral renal cysts. 3. Cholelithiasis.     Electronically Signed   By: Eddie Candle M.D.   On: 07/24/2020 14:15  The above MRI images were personally reviewed.  I also personally compared this to his MRI from 10/15/2019 and essentially this lesion appears unchanged   Assessment & Plan:    1. Renal mass, right Stable Bosniak 4 right lower pole renal mass, unchanged x9 months which is reassuring for a more indolent type of tumor  Given his age and comorbidities, we discussed continued surveillance to which he is agreeable  Given lack of interval change over 9 months.,  Will reimage again in 1 year with MRI.  He is agreeable this plan.  He understands the risk of progression locally or to metastatic disease albeit relatively low risk. - MR Abdomen W Wo Contrast; Future  2.  Acquired cyst of kidney Additional cyst appear benign - MR Abdomen W Wo Contrast; Future   F/u 1 year with MR  Hollice Espy, MD  [facility] [address] [place], [place] [address] [phone]  I spent 30 total minutes on the day of the encounter including pre-visit review of the medical record, face-to-face time with the patient, and post visit ordering of labs/imaging/tests.

## 2020-08-03 ENCOUNTER — Other Ambulatory Visit: Payer: Self-pay | Admitting: Internal Medicine

## 2020-09-06 ENCOUNTER — Ambulatory Visit (INDEPENDENT_AMBULATORY_CARE_PROVIDER_SITE_OTHER): Payer: Medicare Other | Admitting: Internal Medicine

## 2020-09-06 ENCOUNTER — Other Ambulatory Visit: Payer: Self-pay

## 2020-09-06 ENCOUNTER — Encounter: Payer: Self-pay | Admitting: Internal Medicine

## 2020-09-06 VITALS — BP 132/80 | HR 64 | Temp 97.5°F | Ht 71.0 in | Wt 190.0 lb

## 2020-09-06 DIAGNOSIS — N2889 Other specified disorders of kidney and ureter: Secondary | ICD-10-CM | POA: Diagnosis not present

## 2020-09-06 DIAGNOSIS — I700 Atherosclerosis of aorta: Secondary | ICD-10-CM

## 2020-09-06 DIAGNOSIS — N1831 Chronic kidney disease, stage 3a: Secondary | ICD-10-CM | POA: Diagnosis not present

## 2020-09-06 DIAGNOSIS — K581 Irritable bowel syndrome with constipation: Secondary | ICD-10-CM

## 2020-09-06 DIAGNOSIS — I10 Essential (primary) hypertension: Secondary | ICD-10-CM | POA: Diagnosis not present

## 2020-09-06 DIAGNOSIS — Z0000 Encounter for general adult medical examination without abnormal findings: Secondary | ICD-10-CM | POA: Diagnosis not present

## 2020-09-06 DIAGNOSIS — Z7189 Other specified counseling: Secondary | ICD-10-CM

## 2020-09-06 NOTE — Assessment & Plan Note (Signed)
Getting yearly follow up for likely small cancer

## 2020-09-06 NOTE — Assessment & Plan Note (Signed)
Recent test showed stability On low dose lisinopril

## 2020-09-06 NOTE — Progress Notes (Signed)
Hearing Screening  Method: Audiometry   '500Hz'$  '1000Hz'$  '2000Hz'$  '4000Hz'$   Right ear '20 20 20 '$ 0  Left ear 20 25 0 0   Vision Screening   Right eye Left eye Both eyes  Without correction     With correction 20/25 20/70 20/25

## 2020-09-06 NOTE — Assessment & Plan Note (Signed)
On imaging Prefers no statin 

## 2020-09-06 NOTE — Assessment & Plan Note (Signed)
I have personally reviewed the Medicare Annual Wellness questionnaire and have noted 1. The patient's medical and social history 2. Their use of alcohol, tobacco or illicit drugs 3. Their current medications and supplements 4. The patient's functional ability including ADL's, fall risks, home safety risks and hearing or visual             impairment. 5. Diet and physical activities 6. Evidence for depression or mood disorders  The patients weight, height, BMI and visual acuity have been recorded in the chart I have made referrals, counseling and provided education to the patient based review of the above and I have provided the pt with a written personalized care plan for preventive services.  I have provided you with a copy of your personalized plan for preventive services. Please take the time to review along with your updated medication list.  Done with cancer screening Discussed increasing exercise Second COVID booster soon Flu vaccine in the fall Td booster if any injury Consider shingrix at pharmacy

## 2020-09-06 NOTE — Assessment & Plan Note (Signed)
Doing better with the daily linzess and prn hyoscyamine

## 2020-09-06 NOTE — Assessment & Plan Note (Signed)
See social history 

## 2020-09-06 NOTE — Assessment & Plan Note (Signed)
BP Readings from Last 3 Encounters:  09/06/20 132/80  07/26/20 134/86  05/11/20 132/84   Good control on lisinopril and amlodipine

## 2020-09-06 NOTE — Progress Notes (Signed)
Subjective:    Patient ID: Darren Atkins, male    DOB: [birth year], 80 y.o.   MRN: [id number]  HPI Here for Medicare wellness visit and follow up of chronic health conditions This visit occurred during the SARS-CoV-2 public health emergency.  Safety protocols were in place, including screening questions prior to the visit, additional usage of staff PPE, and extensive cleaning of exam room while observing appropriate contact time as indicated for disinfecting solutions.   Reviewed form and advanced directives Reviewed other doctors No alcohol or tobacco Tries to walk regularly [address] once with vertigo---no injury No depression or anhedonia Vision is okay Hearing is fine Independent with instrumental ADLs No sig memory issues  Vertigo is better Still gets it sometimes---even after all the therapy "I am still aware of it"--trouble "walking a straight line"  Seeing Dr Erlene Quan for the renal mass No recent change   Known aortic atherosclerosis Seen on imaging  Ongoing episodic bowel issues Bowels fairly regular with linzess Uses the hyoscyamine prn---often at night  Not checking BP at home No chest pain or SOB No other dizziness or syncope No edema No palpitations  Last GFR 47  Continues on monthly B12 injections  Current Outpatient Medications on File Prior to Visit  Medication Sig Dispense Refill   amLODipine (NORVASC) 10 MG tablet Take 1 tablet (10 mg total) by mouth daily. 90 tablet 3   cyanocobalamin (,VITAMIN B-12,) 1000 MCG/ML injection INJECT 1 ML  ONCE EVERY MONTH 3 mL 3   folic acid (FOLVITE) 1 MG tablet Take 1 tablet by mouth once daily 90 tablet 3   hyoscyamine (ANASPAZ) 0.125 MG TBDP disintergrating tablet TAKE 1 TABLET UNDER THE TONGUE THREE TIMES DAILY AS NEEDED FOR CRAMPS 60 tablet 0   LINZESS 145 MCG CAPS capsule TAKE 1 CAPSULE BY MOUTH ONCE DAILY BEFORE BREAKFAST 90 capsule 0   lisinopril (ZESTRIL) 10 MG tablet Take 1 tablet by mouth once daily 90 tablet  3   omeprazole (PRILOSEC) 40 MG capsule TAKE 1 CAPSULE BY MOUTH TWICE DAILY. CAN DECREASE TO DAILY IF SYMPTOMS IMPROVE. 180 capsule 1   ondansetron (ZOFRAN-ODT) 4 MG disintegrating tablet Take 4 mg by mouth every 8 (eight) hours as needed.     No current facility-administered medications on file prior to visit.    Allergies  Allergen Reactions   Dapsone Other (See Comments)   Hydrochlorothiazide Other (See Comments)   Hydrochlorothiazide W-Triamterene     REACTION: Palps REACTION: Palps   Naprosyn [Naproxen] Other (See Comments)    Insomnia, stomach upset   Sulfa Antibiotics     REACTION: unspecified   Sulfonamide Derivatives     REACTION: unspecified    Past Medical History:  Diagnosis Date   Allergy    Anemia    Hypertension    Osteoarthritis     Past Surgical History:  Procedure Laterality Date   COLONOSCOPY WITH PROPOFOL N/A 08/05/2018   Procedure: COLONOSCOPY WITH PROPOFOL;  Surgeon: Virgel Manifold, MD;  Location: [facility] ENDOSCOPY;  Service: Endoscopy;  Laterality: N/A;   [place]. 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   Would accept resuscitation attempts but no prolonged artificial ventilation   Probably would accept tube feeds         Social Determinants of Health   Financial Resource Strain: Not on file  Food Insecurity: Not on file  Transportation Needs: Not on file  Physical Activity: Not on file  Stress: Not on file  Social Connections: Not on file  Intimate Partner Violence: Not on file   Review of Systems Appetite is good Weight is stable Sleeps well Wears seat belt Full dentures--lower relined recently No suspicious skin lesions currently Occasional heartburn---uses omeprazole prn. No dysphagia Voids fine--stream is okay. Nocturia is not regular No longer uses cialis Some leg or knee pain---from spinal stenosis. Tylenol helps     Objective:   Physical Exam Constitutional:      Appearance: Normal appearance.  HENT:     Mouth/Throat:     Comments: No lesions Eyes:     Conjunctiva/sclera: Conjunctivae normal.     Pupils: Pupils are equal, round, and reactive to light.  Cardiovascular:     Rate and Rhythm: Normal rate and regular rhythm.     Pulses: Normal pulses.     Heart sounds: No murmur heard.   No gallop.  Pulmonary:     Effort: Pulmonary effort is normal.     Breath sounds: Normal breath sounds. No wheezing or rales.  Abdominal:     Palpations: Abdomen is soft.     Tenderness: There is no abdominal tenderness.  Musculoskeletal:     Cervical back: Neck supple.     Right lower leg: No edema.     Left lower leg: No edema.  Lymphadenopathy:     Cervical: No cervical adenopathy.  Skin:    General: Skin is warm.     Findings: No rash.  Neurological:     Mental Status: He is alert and oriented to person, place, and time.     Comments: President--- "Zoila Shutter, Obama" [phone] D-l-o-w Recall 3/3  Psychiatric:        Mood and Affect: Mood normal.         Behavior: Behavior normal.           Assessment & Plan:

## 2020-09-18 ENCOUNTER — Other Ambulatory Visit: Payer: Self-pay | Admitting: Internal Medicine

## 2020-10-18 ENCOUNTER — Encounter: Payer: Self-pay | Admitting: Internal Medicine

## 2020-10-18 ENCOUNTER — Ambulatory Visit (INDEPENDENT_AMBULATORY_CARE_PROVIDER_SITE_OTHER): Payer: Medicare Other | Admitting: Internal Medicine

## 2020-10-18 ENCOUNTER — Other Ambulatory Visit: Payer: Self-pay

## 2020-10-18 DIAGNOSIS — M792 Neuralgia and neuritis, unspecified: Secondary | ICD-10-CM | POA: Insufficient documentation

## 2020-10-18 NOTE — Progress Notes (Signed)
Subjective:    Patient ID: Darren Atkins, male    DOB: 09-Jul-1940, 80 y.o.   MRN: [id number]  HPI Here due to right shoulder pain This visit occurred during the SARS-CoV-2 public health emergency.  Safety protocols were in place, including screening questions prior to the visit, additional usage of staff PPE, and extensive cleaning of exam room while observing appropriate contact time as indicated for disinfecting solutions.   Has had some right shoulder pain for 2 weeks or so Seems to start by the shoulder blade--and tingling sensation down arm and to thumb and 2nd/3rd fingers Will go away with adjustment of arm position  No known injury or chores No neck problems---no symptoms with neck movement Takes tylenol for his spinal stenosis---helps some No arm weakness  Current Outpatient Medications on File Prior to Visit  Medication Sig Dispense Refill   amLODipine (NORVASC) 10 MG tablet Take 1 tablet (10 mg total) by mouth daily. 90 tablet 3   cyanocobalamin (,VITAMIN B-12,) 1000 MCG/ML injection INJECT 1 ML  ONCE EVERY MONTH 3 mL 3   folic acid (FOLVITE) 1 MG tablet Take 1 tablet by mouth once daily 90 tablet 3   hyoscyamine (ANASPAZ) 0.125 MG TBDP disintergrating tablet TAKE 1 TABLET BY MOUTH UNDER THE TONGUE THREE TIMES DAILY AS NEEDED FOR CRAMPS 60 tablet 0   LINZESS 145 MCG CAPS capsule TAKE 1 CAPSULE BY MOUTH ONCE DAILY BEFORE BREAKFAST 90 capsule 0   lisinopril (ZESTRIL) 10 MG tablet Take 1 tablet by mouth once daily 90 tablet 3   omeprazole (PRILOSEC) 40 MG capsule TAKE 1 CAPSULE BY MOUTH TWICE DAILY. CAN DECREASE TO DAILY IF SYMPTOMS IMPROVE. 180 capsule 1   ondansetron (ZOFRAN-ODT) 4 MG disintegrating tablet Take 4 mg by mouth every 8 (eight) hours as needed.     No current facility-administered medications on file prior to visit.    Allergies  Allergen Reactions   Dapsone Other (See Comments)   Hydrochlorothiazide Other (See Comments)   Hydrochlorothiazide W-Triamterene      REACTION: Palps REACTION: Palps   Naprosyn [Naproxen] Other (See Comments)    Insomnia, stomach upset   Sulfa Antibiotics     REACTION: unspecified   Sulfonamide Derivatives     REACTION: unspecified    Past Medical History:  Diagnosis Date   Allergy    Anemia    Hypertension    Osteoarthritis     Past Surgical History:  Procedure Laterality Date   COLONOSCOPY WITH PROPOFOL N/A 08/05/2018   Procedure: COLONOSCOPY WITH PROPOFOL;  Surgeon: Virgel Manifold, MD;  Location: [facility] ENDOSCOPY;  Service: Endoscopy;  Laterality: N/A;   [place]. Myoview- apical thinning, no ischemia EF 53% 02/08    Family History  Problem Relation Age of Onset   Hypertension Mother    Osteoporosis Mother    Hypertension Father    Coronary artery disease Neg Hx    Diabetes Neg Hx    Cancer Neg Hx     Social History   Socioeconomic History   Marital status: Married    Spouse name: Not on file   Number of children: 1   Years of education: Not on file   Highest education level: Not on file  Occupational History   Occupation: Retired---- Engineer, site for [employer]    Employer: RETIRED    Comment:    Tobacco Use   Smoking status: Never   Smokeless tobacco: Former    Types: Loss adjuster, chartered  Quit date: 02/05/1983  Vaping Use   Vaping Use: Never used  Substance and Sexual Activity   Alcohol use: No   Drug use: Not Currently   Sexual activity: Not on file  Other Topics Concern   Not on file  Social History Narrative   Widowed 2020   Remarried 2021      No living will   No health care POA-- wife or daughter should make decisions   Would accept resuscitation attempts but no prolonged artificial ventilation   Probably would accept tube feeds         Social Determinants of Health   Financial Resource Strain: Not on file  Food Insecurity: Not on file  Transportation Needs: Not on file  Physical Activity: Not on file  Stress: Not on file  Social Connections: Not on  file  Intimate Partner Violence: Not on file   Review of Systems No other joint problems No fever     Objective:   Physical Exam Constitutional:      Appearance: Normal appearance.  Neck:     Comments: Mild restriction in flexion/extension Slight tenderness along medial trapezius on right Musculoskeletal:     Comments: Normal findings in right shoulder, elbow, wrist   Neurological:     Mental Status: He is alert.     Comments: No arm weakness Normal grip strength           Assessment & Plan:

## 2020-10-18 NOTE — Assessment & Plan Note (Signed)
Could be C6-7-----or more distal at radial nerve Symptoms are mild and self limited No weakness Discussed steroid burst---will hold off May need imaging if gets weakness or more severe pain

## 2020-10-23 ENCOUNTER — Telehealth: Payer: Self-pay | Admitting: Internal Medicine

## 2020-10-23 NOTE — Telephone Encounter (Signed)
Pt called to update chart. States he received his [employer] as well as his flu shot today. He is unsure if it was high dose

## 2020-10-25 NOTE — Telephone Encounter (Signed)
Updated pt's chart.  

## 2020-10-30 ENCOUNTER — Other Ambulatory Visit: Payer: Self-pay | Admitting: Internal Medicine

## 2020-11-13 ENCOUNTER — Other Ambulatory Visit: Payer: Self-pay | Admitting: Internal Medicine

## 2020-11-29 ENCOUNTER — Other Ambulatory Visit: Payer: Self-pay | Admitting: Internal Medicine

## 2020-12-04 ENCOUNTER — Other Ambulatory Visit: Payer: Self-pay | Admitting: Internal Medicine

## 2021-01-15 ENCOUNTER — Other Ambulatory Visit: Payer: Self-pay | Admitting: Internal Medicine

## 2021-02-05 ENCOUNTER — Other Ambulatory Visit: Payer: Self-pay | Admitting: Internal Medicine

## 2021-02-26 ENCOUNTER — Other Ambulatory Visit: Payer: Self-pay | Admitting: Internal Medicine

## 2021-04-24 ENCOUNTER — Encounter: Payer: Self-pay | Admitting: Internal Medicine

## 2021-04-24 ENCOUNTER — Other Ambulatory Visit: Payer: Self-pay

## 2021-04-24 ENCOUNTER — Ambulatory Visit (INDEPENDENT_AMBULATORY_CARE_PROVIDER_SITE_OTHER): Payer: Medicare Other | Admitting: Internal Medicine

## 2021-04-24 DIAGNOSIS — L989 Disorder of the skin and subcutaneous tissue, unspecified: Secondary | ICD-10-CM | POA: Diagnosis not present

## 2021-04-24 NOTE — Progress Notes (Signed)
? ?Subjective:  ? ? Patient ID: Darren Atkins, male    DOB: April 08, 1940, 81 y.o.   MRN: [id number] ? ?HPI ?Here due to skin lesion on his hand ? ?Noticed scab on dorsum of hand 2-3 weeks ago ?Some soreness ?Crust will come off--but it doesn't heal ?No bleeding ? ?Current Outpatient Medications on File Prior to Visit  ?Medication Sig Dispense Refill  ? amLODipine (NORVASC) 10 MG tablet Take 1 tablet by mouth once daily 90 tablet 3  ? cyanocobalamin (,VITAMIN B-12,) 1000 MCG/ML injection INJECT 1 ML  ONCE EVERY MONTH 3 mL 3  ? folic acid (FOLVITE) 1 MG tablet Take 1 tablet by mouth once daily 90 tablet 3  ? hyoscyamine (ANASPAZ) 0.125 MG TBDP disintergrating tablet TAKE 1 TABLET BY MOUTH THREE TIMES DAILY AS NEEDED FOR CRAMPS 60 tablet 0  ? LINZESS 145 MCG CAPS capsule TAKE 1 CAPSULE BY MOUTH ONCE DAILY BEFORE BREAKFAST 90 capsule 1  ? lisinopril (ZESTRIL) 10 MG tablet Take 1 tablet by mouth once daily 90 tablet 3  ? omeprazole (PRILOSEC) 40 MG capsule TAKE 1 CAPSULE BY MOUTH TWICE DAILY. CAN DECREASE TO DAILY IF SYMPTOMS IMPROVE. 180 capsule 1  ? ondansetron (ZOFRAN-ODT) 4 MG disintegrating tablet Take 4 mg by mouth every 8 (eight) hours as needed.    ? ?No current facility-administered medications on file prior to visit.  ? ? ?Allergies  ?Allergen Reactions  ? Dapsone Other (See Comments)  ? Hydrochlorothiazide Other (See Comments)  ? Hydrochlorothiazide W-Triamterene   ?  REACTION: Palps ?REACTION: Palps  ? Naprosyn [Naproxen] Other (See Comments)  ?  Insomnia, stomach upset  ? Sulfa Antibiotics   ?  REACTION: unspecified  ? Sulfonamide Derivatives   ?  REACTION: unspecified  ? ? ?Past Medical History:  ?Diagnosis Date  ? Allergy   ? Anemia   ? Hypertension   ? Osteoarthritis   ? ? ?Past Surgical History:  ?Procedure Laterality Date  ? COLONOSCOPY WITH PROPOFOL N/A 08/05/2018  ? Procedure: COLONOSCOPY WITH PROPOFOL;  Surgeon: Virgel Manifold, MD;  Location: [facility] ENDOSCOPY;  Service: Endoscopy;  Laterality: N/A;   ? [place] MYOVIEW [facility]    ? [place]. Myoview- apical thinning, no ischemia EF 53% 02/08  ? ? ?Family History  ?Problem Relation Age of Onset  ? Hypertension Mother   ? Osteoporosis Mother   ? Hypertension Father   ? Coronary artery disease Neg Hx   ? Diabetes Neg Hx   ? Cancer Neg Hx   ? ? ?Social History  ? ?Socioeconomic History  ? Marital status: Married  ?  Spouse name: Not on file  ? Number of children: 1  ? Years of education: Not on file  ? Highest education level: Not on file  ?Occupational History  ? Occupation: Retired---- [employer] for [employer]  ?  Employer: RETIRED  ?  Comment:    ?Tobacco Use  ? Smoking status: Never  ?  Passive exposure: Never  ? Smokeless tobacco: Former  ?  Types: Chew  ?  Quit date: 02/05/1983  ?Vaping Use  ? Vaping Use: Never used  ?Substance and Sexual Activity  ? Alcohol use: No  ? Drug use: Not Currently  ? Sexual activity: Not on file  ?Other Topics Concern  ? Not on file  ?Social History Narrative  ? Widowed 2020  ? Remarried 2021  ?   ? No living will  ? No health care POA-- wife or daughter should make decisions  ?  Would accept resuscitation attempts but no prolonged artificial ventilation  ? Probably would accept tube feeds  ?   ?   ? ?Social Determinants of Health  ? ?Financial Resource Strain: Not on file  ?Food Insecurity: Not on file  ?Transportation Needs: Not on file  ?Physical Activity: Not on file  ?Stress: Not on file  ?Social Connections: Not on file  ?Intimate Partner Violence: Not on file  ? ?Review of Systems ?No other lesions ? ?   ?Objective:  ? Physical Exam ?Constitutional:   ?   Appearance: Normal appearance.  ?Skin: ?   Comments: 54m raised red lesion with central core of lighter color ?Not infected  ?Neurological:  ?   Mental Status: He is alert.  ?  ? ? ? ? ?   ?Assessment & Plan:  ? ?

## 2021-04-24 NOTE — Assessment & Plan Note (Signed)
Highly suspicious for carcinoma ?Wife goes to Dr Jerrye Bushy will try to get in with her as soon as possible ?

## 2021-04-25 ENCOUNTER — Other Ambulatory Visit: Payer: Self-pay | Admitting: Internal Medicine

## 2021-04-27 DIAGNOSIS — C44622 Squamous cell carcinoma of skin of right upper limb, including shoulder: Secondary | ICD-10-CM | POA: Diagnosis not present

## 2021-04-27 DIAGNOSIS — D485 Neoplasm of uncertain behavior of skin: Secondary | ICD-10-CM | POA: Diagnosis not present

## 2021-05-09 ENCOUNTER — Other Ambulatory Visit: Payer: Self-pay | Admitting: Internal Medicine

## 2021-05-24 DIAGNOSIS — L988 Other specified disorders of the skin and subcutaneous tissue: Secondary | ICD-10-CM | POA: Diagnosis not present

## 2021-05-24 DIAGNOSIS — C44622 Squamous cell carcinoma of skin of right upper limb, including shoulder: Secondary | ICD-10-CM | POA: Diagnosis not present

## 2021-07-02 ENCOUNTER — Other Ambulatory Visit: Payer: Self-pay | Admitting: Internal Medicine

## 2021-07-16 ENCOUNTER — Ambulatory Visit
Admission: RE | Admit: 2021-07-16 | Discharge: 2021-07-16 | Disposition: A | Discharge status: Home / Self Care | Payer: Medicare Other | Source: Ambulatory Visit | Attending: Urology | Admitting: Urology

## 2021-07-16 DIAGNOSIS — N281 Cyst of kidney, acquired: Secondary | ICD-10-CM | POA: Insufficient documentation

## 2021-07-16 DIAGNOSIS — K8020 Calculus of gallbladder without cholecystitis without obstruction: Secondary | ICD-10-CM | POA: Diagnosis not present

## 2021-07-16 DIAGNOSIS — K828 Other specified diseases of gallbladder: Secondary | ICD-10-CM | POA: Diagnosis not present

## 2021-07-16 DIAGNOSIS — N2889 Other specified disorders of kidney and ureter: Secondary | ICD-10-CM | POA: Insufficient documentation

## 2021-07-16 DIAGNOSIS — D49511 Neoplasm of unspecified behavior of right kidney: Secondary | ICD-10-CM | POA: Diagnosis not present

## 2021-07-16 IMAGING — MR MR ABDOMEN WO/W CM
18 series · 47 of 48 positions shown · IV contrast (gadavist)
Comparison: Abdominal MRI [DATE].

CLINICAL DATA: 80-year-old male with history of complex cystic
lesion in the right kidney. Follow-up study.

EXAM:
MRI ABDOMEN WITHOUT AND WITH CONTRAST
TECHNIQUE: Multiplanar multisequence MR imaging of the abdomen was performed
both before and after the administration of intravenous contrast.
CONTRAST:  10mL GADAVIST GADOBUTROL 1 MMOL/ML IV SOLN

[Series 3: T2 · coronal · 6.0mm · 1.19mm/px · 2 of 34 slices shown (1 of 2)]
[im 1/34]
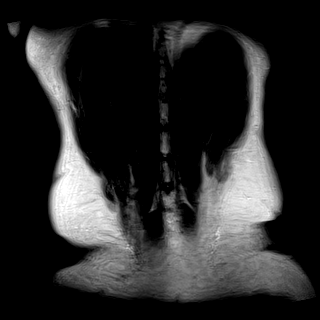
[im 34/34]
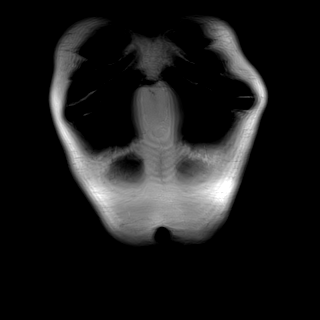

[Series 4: T2 · axial · 6.0mm · 1.19mm/px · z∈[-60,+163]mm · 2 of 32 slices shown (2 of 2)]
[im 1/32]
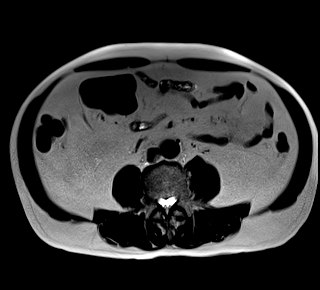
[im 32/32]
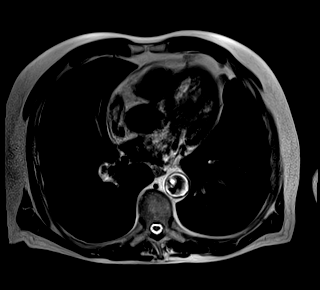

[Series 5: T1 · axial · 3.0mm · 1.19mm/px · z∈[-61,+164]mm · 4 of 76 slices shown (1 of 2)]
[im 1/76]
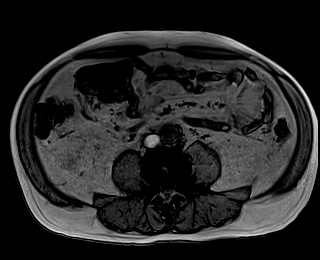
[im 26/76]
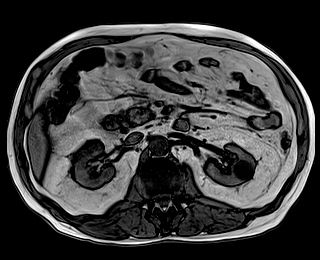
[im 51/76]
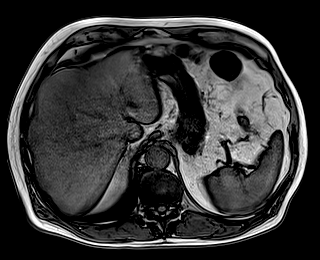
[im 76/76]
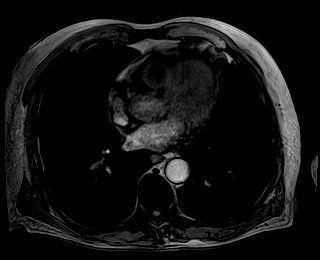

[Series 6: T1 · axial · 3.0mm · 1.19mm/px · z∈[-61,+164]mm · 3 of 76 slices shown (2 of 2)]
[im 1/76]
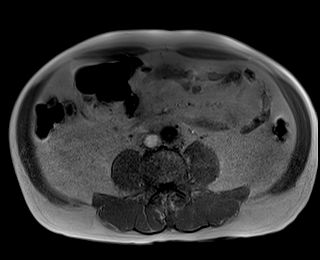
[im 38/76]
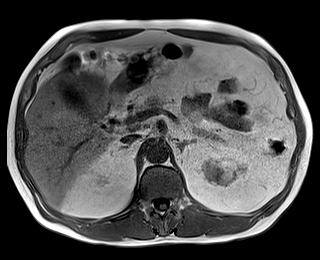
[im 76/76]
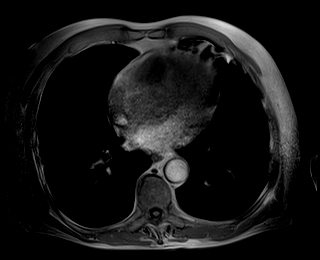

[Series 7: bSSFP · axial · 6.0mm · 0.74mm/px · 1 of 32 slices shown]
[im 1/32]
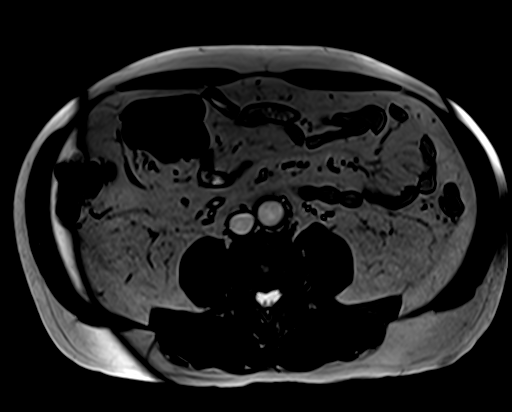

[Series 8: ax dwi_tracew · axial · 6.0mm · 1.42mm/px · z∈[-47,+161]mm · 4 of 90 slices shown]
[im 1/90]
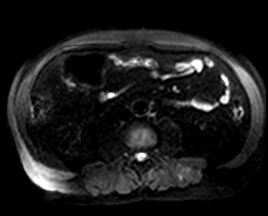
[im 30/90]
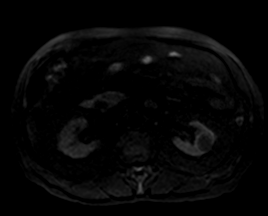
[im 60/90]
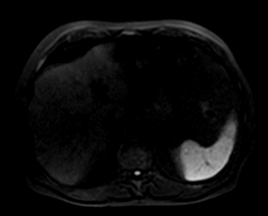
[im 90/90]
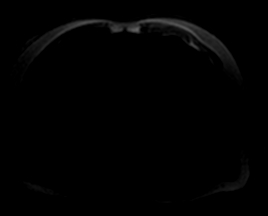

[Series 9: ax dwi_adc · axial · 6.0mm · 1.42mm/px · 1 of 30 slices shown]
[im 1/30]
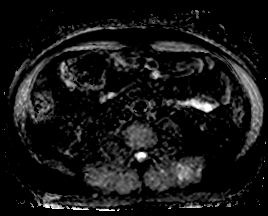

[Series 12: T2 fat-sat · axial · 6.0mm · 1.19mm/px · 1 of 32 slices shown]
[im 1/32]
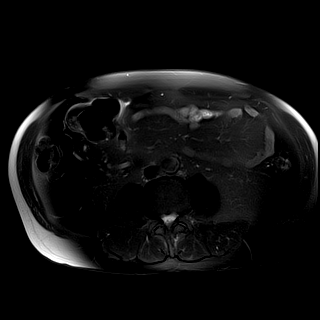

[Series 14: T1 dynamic fat-sat · axial · non-contrast · 3.0mm · 1.19mm/px · z∈[-63,+150]mm · 3 of 72 slices shown (1 of 5)]
[im 1/72]
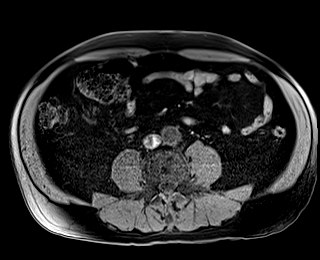
[im 36/72]
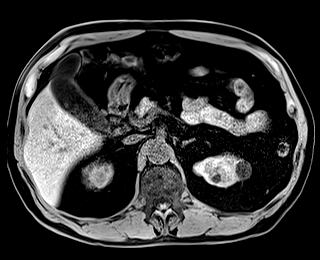
[im 72/72]
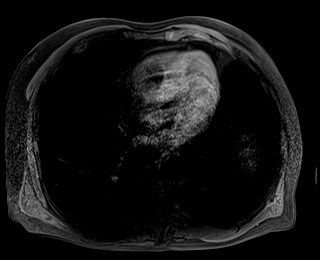

[Series 15: T1 dynamic fat-sat post-contrast · axial · 3.0mm · 1.19mm/px · z∈[-63,+150]mm · 3 of 72 slices shown (1 of 4)]
[im 1/72]
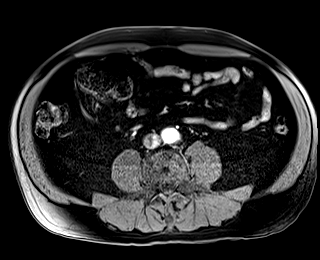
[im 36/72]
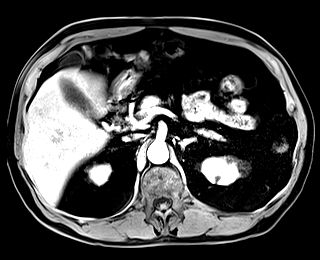
[im 72/72]
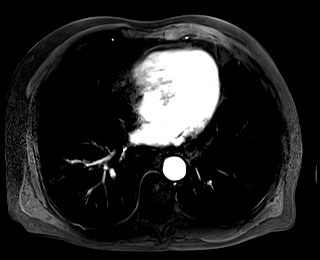

[Series 16: T1 dynamic fat-sat · axial · 3.0mm · 1.19mm/px · z∈[-63,+150]mm · 3 of 72 slices shown (2 of 5)]
[im 1/72]
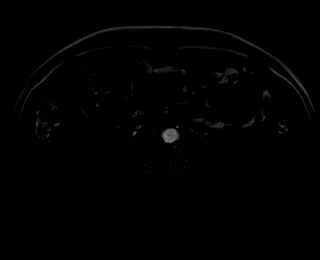
[im 36/72]
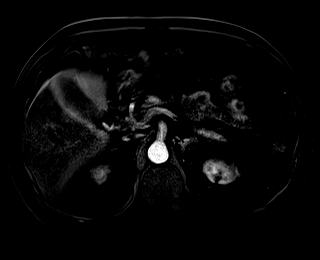
[im 72/72]
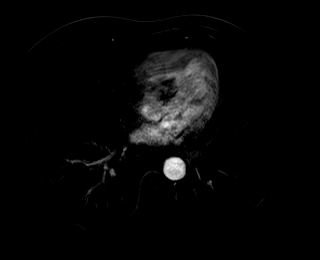

[Series 17: T1 dynamic fat-sat post-contrast · axial · 3.0mm · 1.19mm/px · z∈[-63,+150]mm · 3 of 72 slices shown (2 of 4)]
[im 1/72]
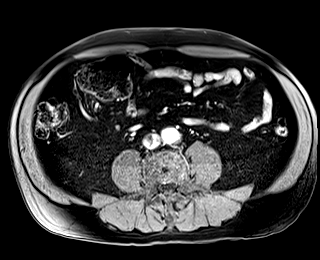
[im 36/72]
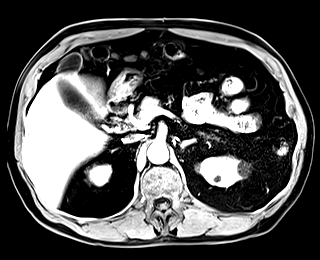
[im 72/72]
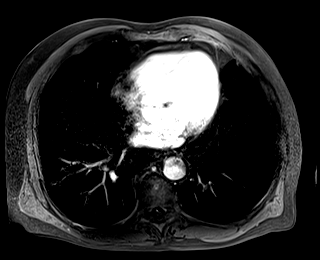

[Series 18: T1 dynamic fat-sat · axial · 3.0mm · 1.19mm/px · z∈[-63,+150]mm · 3 of 72 slices shown (3 of 5)]
[im 1/72]
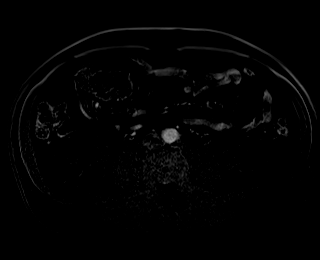
[im 36/72]
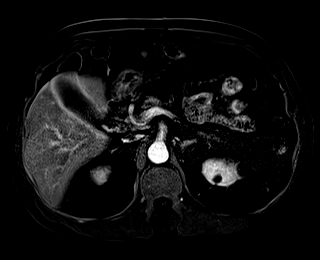
[im 72/72]
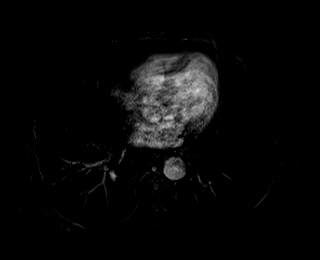

[Series 19: T1 dynamic fat-sat post-contrast · axial · 3.0mm · 1.19mm/px · z∈[-63,+150]mm · 3 of 72 slices shown (3 of 4)]
[im 1/72]
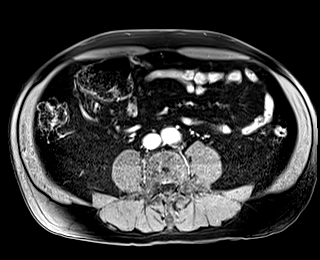
[im 36/72]
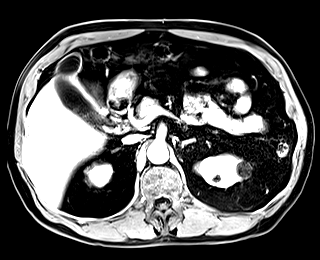
[im 72/72]
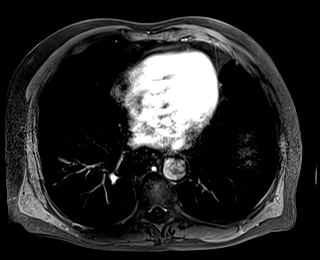

[Series 20: T1 dynamic fat-sat · axial · 3.0mm · 1.19mm/px · z∈[-63,+150]mm · 3 of 72 slices shown (4 of 5)]
[im 1/72]
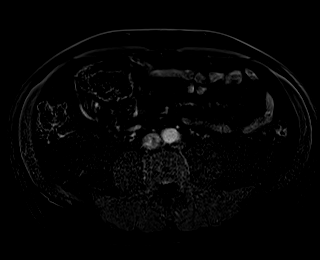
[im 36/72]
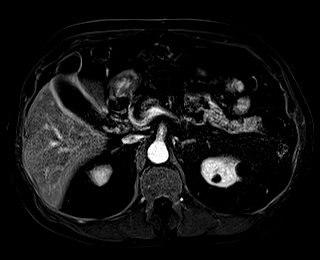
[im 72/72]
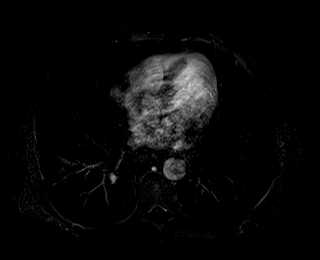

[Series 21: T1 dynamic post-contrast · coronal · 3.5mm · 1.19mm/px · 3 of 72 slices shown]
[im 1/72]
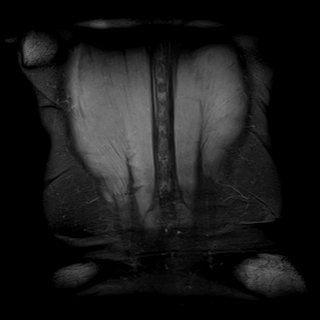
[im 36/72]
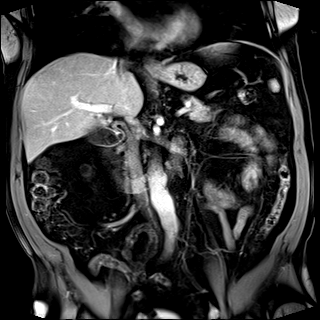
[im 72/72]
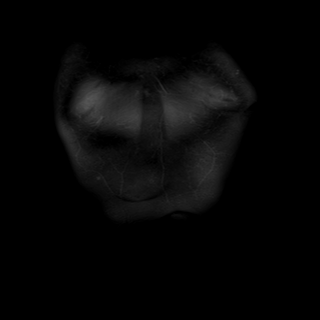

[Series 22: T1 dynamic fat-sat post-contrast · axial · 3.0mm · 1.19mm/px · z∈[-63,+150]mm · 3 of 72 slices shown (4 of 4)]
[im 1/72]
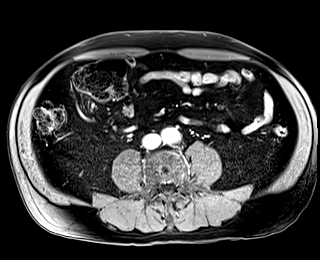
[im 36/72]
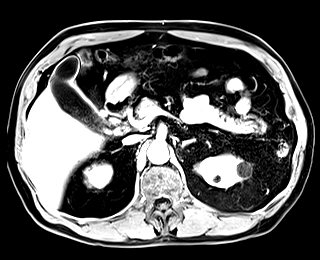
[im 72/72]
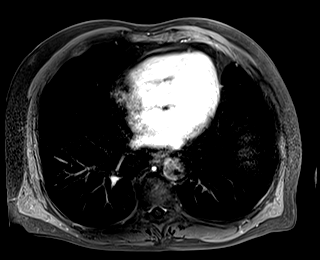

[Series 23: T1 dynamic fat-sat · axial · 3.0mm · 1.19mm/px · z∈[-63,+42]mm · 2 of 72 slices shown (5 of 5)]
[im 1/72]
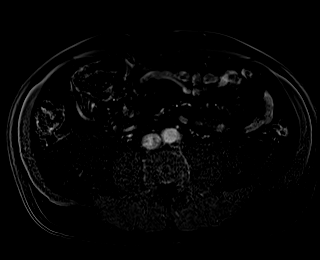
[im 36/72]
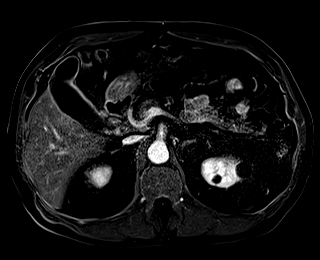

[47 of 48 positions shown; findings below may reference images not displayed]

FINDINGS: Lower chest: Unremarkable.

Hepatobiliary: Diffuse loss of signal intensity throughout the
hepatic parenchyma on out of phase dual echo images, indicative of a
background of hepatic steatosis. No suspicious cystic or solid
hepatic lesions. No intra or extrahepatic biliary ductal dilatation.
There are numerous filling defects within the gallbladder,
indicative of gallstones. Gallbladder is moderately distended.
Gallbladder wall thickness is normal. No pericholecystic fluid or
surrounding inflammatory changes. Common bile duct measures 4 mm in
the porta hepatis.

Pancreas: No pancreatic mass. No pancreatic ductal dilatation. No
pancreatic or peripancreatic fluid collections or inflammatory
changes.

Spleen:  Unremarkable.

Adrenals/Urinary Tract: Multiple renal lesions are again noted
bilaterally, the majority of which are T1 hypointense, T2
hyperintense and do not enhance, indicative of simple (Bosniak class
1) cysts, largest of which measures up to 5.2 x 3.2 x 4.4 cm
extending exophytically from the medial aspect of the lower pole of
the right kidney (axial image 26 of series 4). Other lesions are T1
hyperintense, variable signal intensity on T2 weighted images, and
do not enhance, compatible with proteinaceous/hemorrhagic cysts,
largest of which is exophytic in the medial aspect of the upper pole
of the left kidney (axial image 40 of series 14) measuring 1.8 cm.
The lesion of concern on prior examinations is an exophytic lesion
in the lower pole of the right kidney (axial image 64 of series 19
and coronal image 26 of series 21) which measures 2.6 x 2.1 x 2.2 cm
on today's examination. This lesion is predominantly T1
hyperintense, mixed signal intensity on T2 weighted images but
predominantly T2 hypointense, and once again demonstrates an
enhancing mural nodule along the superolateral aspect of the lesion
which measures up to 1.4 cm (coronal image 26 of series 21). This
lesion remains encapsulated within Gerota's fascia and is well
separated from the right renal vein which is widely patent at this
time. No other definite aggressive appearing renal lesions are
noted. Bilateral adrenal glands are normal in appearance. No
hydroureteronephrosis in the visualized portions of the abdomen.

Stomach/Bowel: Visualized portions are unremarkable.

Vascular/Lymphatic: Aortic atherosclerosis, without evidence of
aneurysm in the visualized abdominal vasculature. No lymphadenopathy
noted in the abdomen.

Other: No significant volume of ascites noted in the visualized
portions of the peritoneal cavity.

Musculoskeletal: No aggressive appearing osseous lesions are noted
in the visualized portions of the skeleton.
IMPRESSION: 1. Continued slow growth of a small neoplasm in the lower pole of
the right kidney, concerning for slow-growing malignant renal
neoplasm. At this time, this lesion measures 2.6 x 2.1 x 2.2 cm, is
encapsulated within Gerota's fascia, is well separate from the right
renal vein which is widely patent, and is not associated with
lymphadenopathy or definite signs of metastatic disease in the
abdomen.
2. Multiple other Bosniak class 1 and Bosniak class 2 cysts in the
kidneys bilaterally, as above.
3. Hepatic steatosis.
4. Cholelithiasis without evidence of acute cholecystitis.
5. Aortic atherosclerosis.

## 2021-07-16 MED ORDER — GADOBUTROL 1 MMOL/ML IV SOLN
8.0000 mL | Freq: Once | INTRAVENOUS | Status: AC | PRN
  Administered 2021-07-16: 10 mL via INTRAVENOUS

## 2021-07-19 ENCOUNTER — Other Ambulatory Visit: Payer: Self-pay | Admitting: Internal Medicine

## 2021-07-30 ENCOUNTER — Ambulatory Visit (INDEPENDENT_AMBULATORY_CARE_PROVIDER_SITE_OTHER): Payer: Medicare Other | Admitting: Internal Medicine

## 2021-07-30 ENCOUNTER — Encounter: Payer: Self-pay | Admitting: Internal Medicine

## 2021-07-30 DIAGNOSIS — R42 Dizziness and giddiness: Secondary | ICD-10-CM | POA: Diagnosis not present

## 2021-07-30 NOTE — Assessment & Plan Note (Signed)
He has had this before---more severe but briefer Discussed Epley maneuver--can consider trying at home Meclizine for days that are worse

## 2021-07-31 ENCOUNTER — Ambulatory Visit: Payer: Medicare Other | Admitting: Physician Assistant

## 2021-08-02 ENCOUNTER — Encounter: Payer: Self-pay | Admitting: Physician Assistant

## 2021-08-02 ENCOUNTER — Ambulatory Visit: Payer: Medicare Other | Admitting: Physician Assistant

## 2021-08-02 VITALS — BP 141/75 | HR 76 | Ht 71.0 in | Wt 188.0 lb

## 2021-08-02 DIAGNOSIS — N2889 Other specified disorders of kidney and ureter: Secondary | ICD-10-CM

## 2021-08-03 NOTE — Progress Notes (Signed)
08/02/2021 3:44 PM   Darren Atkins Mar 04, 1940 [id number]  CC: Chief Complaint  Patient presents with   renal mass   HPI: Darren Atkins is a 81 y.o. male with PMH right renal mass on surveillance who presents today for annual follow-up.  He is accompanied by his wife, who contributes to HPI.  Today he reports no gross hematuria flank pain, weight loss, cough, or shortness of breath over the past year.  He has been doing well and has no acute concerns at this time.  MRI abdomen with and without contrast dated 07/16/2021 revealed a slightly enlarged Bosniak 4 right lower pole renal mass, now measuring 2.6 x 2.1 x 2.2 cm.  It is well encapsulated within Gerota's fascia, separate from the renal vein, and there is no associated lymphadenopathy.  PMH: Past Medical History:  Diagnosis Date   Allergy    Anemia    Hypertension    Osteoarthritis     Surgical History: Past Surgical History:  Procedure Laterality Date   COLONOSCOPY WITH PROPOFOL N/A 08/05/2018   Procedure: COLONOSCOPY WITH PROPOFOL;  Surgeon: Virgel Manifold, MD;  Location: [facility] ENDOSCOPY;  Service: Endoscopy;  Laterality: N/A;   [name]. Myoview- apical thinning, no ischemia EF 53% 02/08    Home Medications:  Allergies as of 08/02/2021       Reactions   Dapsone Other (See Comments)   Hydrochlorothiazide Other (See Comments)   Hydrochlorothiazide W-triamterene    REACTION: Palps REACTION: Palps   Naprosyn [naproxen] Other (See Comments)   Insomnia, stomach upset   Sulfa Antibiotics    REACTION: unspecified   Sulfonamide Derivatives    REACTION: unspecified        Medication List        Accurate as of August 02, 2021 11:59 PM. If you have any questions, ask your nurse or doctor.          amLODipine 10 MG tablet Commonly known as: NORVASC Take 1 tablet by mouth once daily   cyanocobalamin 1000 MCG/ML injection Commonly known as: (VITAMIN B-12) INJECT 1 ML  ONCE EVERY MONTH    folic acid 1 MG tablet Commonly known as: FOLVITE Take 1 tablet by mouth once daily   hyoscyamine 0.125 MG Tbdp disintergrating tablet Commonly known as: ANASPAZ TAKE 1 TABLET BY MOUTH THREE TIMES DAILY AS NEEDED FOR CRAMPS   Linzess 145 MCG Caps capsule Generic drug: linaclotide TAKE 1 CAPSULE BY MOUTH ONCE DAILY BEFORE BREAKFAST   lisinopril 10 MG tablet Commonly known as: ZESTRIL Take 1 tablet by mouth once daily   omeprazole 40 MG capsule Commonly known as: PRILOSEC TAKE 1 CAPSULE BY MOUTH TWICE DAILY CAN  DECREASE  TO  DAILY  IF  SYMPTOMS  IMPROVE.   ondansetron 4 MG disintegrating tablet Commonly known as: ZOFRAN-ODT Take 4 mg by mouth every 8 (eight) hours as needed.        Allergies:  Allergies  Allergen Reactions   Dapsone Other (See Comments)   Hydrochlorothiazide Other (See Comments)   Hydrochlorothiazide W-Triamterene     REACTION: Palps REACTION: Palps   Naprosyn [Naproxen] Other (See Comments)    Insomnia, stomach upset   Sulfa Antibiotics     REACTION: unspecified   Sulfonamide Derivatives     REACTION: unspecified    Family History: Family History  Problem Relation Age of Onset   Hypertension Mother    Osteoporosis Mother    Hypertension Father    Coronary  artery disease Neg Hx    Diabetes Neg Hx    Cancer Neg Hx     Social History:   reports that he has never smoked. He has never been exposed to tobacco smoke. He quit smokeless tobacco use about 38 years ago.  His smokeless tobacco use included chew. He reports that he does not currently use drugs. He reports that he does not drink alcohol.  Physical Exam: BP (!) 141/75   Pulse 76   Ht '5\' 11"'$  (1.803 m)   Wt 188 lb (85.3 kg)   BMI 26.22 kg/m   Constitutional:  Alert and oriented, no acute distress, nontoxic appearing HEENT: [place], AT Cardiovascular: No clubbing, cyanosis, or edema Respiratory: Normal respiratory effort, no increased work of breathing Skin: No rashes, bruises or  suspicious lesions Neurologic: Grossly intact, no focal deficits, moving all 4 extremities Psychiatric: Normal mood and affect  Pertinent Imaging: MRI abdomen with and without contrast, 07/16/2021: CLINICAL DATA:  81 year old male with history of complex cystic lesion in the right kidney. Follow-up study.   EXAM: MRI ABDOMEN WITHOUT AND WITH CONTRAST   TECHNIQUE: Multiplanar multisequence MR imaging of the abdomen was performed both before and after the administration of intravenous contrast.   CONTRAST:  39m GADAVIST GADOBUTROL 1 MMOL/ML IV SOLN   COMPARISON:  Abdominal MRI 07/24/2020.   FINDINGS: Lower chest: Unremarkable.   Hepatobiliary: Diffuse loss of signal intensity throughout the hepatic parenchyma on out of phase dual echo images, indicative of a background of hepatic steatosis. No suspicious cystic or solid hepatic lesions. No intra or extrahepatic biliary ductal dilatation. There are numerous filling defects within the gallbladder, indicative of gallstones. Gallbladder is moderately distended. Gallbladder wall thickness is normal. No pericholecystic fluid or surrounding inflammatory changes. Common bile duct measures 4 mm in the porta hepatis.   Pancreas: No pancreatic mass. No pancreatic ductal dilatation. No pancreatic or peripancreatic fluid collections or inflammatory changes.   Spleen:  Unremarkable.   Adrenals/Urinary Tract: Multiple renal lesions are again noted bilaterally, the majority of which are T1 hypointense, T2 hyperintense and do not enhance, indicative of simple (Bosniak class 1) cysts, largest of which measures up to 5.2 x 3.2 x 4.4 cm extending exophytically from the medial aspect of the lower pole of the right kidney (axial image 26 of series 4). Other lesions are T1 hyperintense, variable signal intensity on T2 weighted images, and do not enhance, compatible with proteinaceous/hemorrhagic cysts, largest of which is exophytic in the  medial aspect of the upper pole of the left kidney (axial image 40 of series 14) measuring 1.8 cm. The lesion of concern on prior examinations is an exophytic lesion in the lower pole of the right kidney (axial image 64 of series 19 and coronal image 26 of series 21) which measures 2.6 x 2.1 x 2.2 cm on today's examination. This lesion is predominantly T1 hyperintense, mixed signal intensity on T2 weighted images but predominantly T2 hypointense, and once again demonstrates an enhancing mural nodule along the superolateral aspect of the lesion which measures up to 1.4 cm (coronal image 26 of series 21). This lesion remains encapsulated within Gerota's fascia and is well separated from the right renal vein which is widely patent at this time. No other definite aggressive appearing renal lesions are noted. Bilateral adrenal glands are normal in appearance. No hydroureteronephrosis in the visualized portions of the abdomen.   Stomach/Bowel: Visualized portions are unremarkable.   Vascular/Lymphatic: Aortic atherosclerosis, without evidence of aneurysm in the visualized abdominal  vasculature. No lymphadenopathy noted in the abdomen.   Other: No significant volume of ascites noted in the visualized portions of the peritoneal cavity.   Musculoskeletal: No aggressive appearing osseous lesions are noted in the visualized portions of the skeleton.   IMPRESSION: 1. Continued slow growth of a small neoplasm in the lower pole of the right kidney, concerning for slow-growing malignant renal neoplasm. At this time, this lesion measures 2.6 x 2.1 x 2.2 cm, is encapsulated within Gerota's fascia, is well separate from the right renal vein which is widely patent, and is not associated with lymphadenopathy or definite signs of metastatic disease in the abdomen. 2. Multiple other Bosniak class 1 and Bosniak class 2 cysts in the kidneys bilaterally, as above. 3. Hepatic steatosis. 4.  Cholelithiasis without evidence of acute cholecystitis. 5. Aortic atherosclerosis.     Electronically Signed   By: Vinnie Langton M.D.   On: 07/17/2021 06:43  I personally reviewed the images referenced above and note slight interval enlargement of the right lower pole renal mass.  Assessment & Plan:   1. Renal mass, right On surveillance.  We discussed that there has been slight interval enlargement over the last year, well within the average anticipated growth of similar cystic lesions.  He remains asymptomatic and there is no evidence of surrounding lymphadenopathy or extension outside of Gerota's fascia concerning for metastatic disease.  I recommended continuing with annual surveillance that he is in agreement with this plan.  We discussed return precautions including flank pain, gross hematuria, and unintentional weight loss. - MR Abdomen W Wo Contrast; Future  Return in about 1 year (around 08/03/2022) for Annual renal mass follow-up with MRI prior.  Debroah Loop, PA-C  [facility] [address], [place], [place] [address] [phone]

## 2021-09-07 ENCOUNTER — Encounter: Payer: Self-pay | Admitting: Internal Medicine

## 2021-09-07 ENCOUNTER — Ambulatory Visit (INDEPENDENT_AMBULATORY_CARE_PROVIDER_SITE_OTHER): Payer: Medicare Other | Admitting: Internal Medicine

## 2021-09-07 VITALS — BP 124/80 | HR 70 | Temp 97.7°F | Ht 71.0 in | Wt 185.0 lb

## 2021-09-07 DIAGNOSIS — N2889 Other specified disorders of kidney and ureter: Secondary | ICD-10-CM | POA: Diagnosis not present

## 2021-09-07 DIAGNOSIS — N2581 Secondary hyperparathyroidism of renal origin: Secondary | ICD-10-CM

## 2021-09-07 DIAGNOSIS — I700 Atherosclerosis of aorta: Secondary | ICD-10-CM

## 2021-09-07 DIAGNOSIS — N1831 Chronic kidney disease, stage 3a: Secondary | ICD-10-CM

## 2021-09-07 DIAGNOSIS — K581 Irritable bowel syndrome with constipation: Secondary | ICD-10-CM | POA: Diagnosis not present

## 2021-09-07 DIAGNOSIS — D510 Vitamin B12 deficiency anemia due to intrinsic factor deficiency: Secondary | ICD-10-CM | POA: Diagnosis not present

## 2021-09-07 DIAGNOSIS — Z0000 Encounter for general adult medical examination without abnormal findings: Secondary | ICD-10-CM

## 2021-09-07 DIAGNOSIS — I10 Essential (primary) hypertension: Secondary | ICD-10-CM

## 2021-09-07 LAB — RENAL FUNCTION PANEL
Albumin: 4.4 g/dL (ref 3.5–5.2)
BUN: 17 mg/dL (ref 6–23)
CO2: 25 mEq/L (ref 19–32)
Calcium: 9 mg/dL (ref 8.4–10.5)
Chloride: 106 mEq/L (ref 96–112)
Creatinine, Ser: 1.64 mg/dL — ABNORMAL HIGH (ref 0.40–1.50)
GFR: 39.21 mL/min — ABNORMAL LOW (ref >60.00)
Glucose, Bld: 89 mg/dL (ref 70–99)
Phosphorus: 3.5 mg/dL (ref 2.3–4.6)
Potassium: 5.7 mEq/L — ABNORMAL HIGH (ref 3.5–5.1)
Sodium: 136 mEq/L (ref 135–145)

## 2021-09-07 LAB — LIPID PANEL
Cholesterol: 193 mg/dL (ref 0–200)
HDL: 31.1 mg/dL — ABNORMAL LOW (ref >39.00)
NonHDL: 161.77
Total CHOL/HDL Ratio: 6
Triglycerides: 235 mg/dL — ABNORMAL HIGH (ref 0.0–149.0)
VLDL: 47 mg/dL — ABNORMAL HIGH (ref 0.0–40.0)

## 2021-09-07 LAB — HEPATIC FUNCTION PANEL
ALT: 23 U/L (ref 0–53)
AST: 18 U/L (ref 0–37)
Albumin: 4.4 g/dL (ref 3.5–5.2)
Alkaline Phosphatase: 68 U/L (ref 39–117)
Bilirubin, Direct: 0.2 mg/dL (ref 0.0–0.3)
Total Bilirubin: 1.2 mg/dL (ref 0.2–1.2)
Total Protein: 7.1 g/dL (ref 6.0–8.3)

## 2021-09-07 LAB — CBC
HCT: 41.1 % (ref 39.0–52.0)
Hemoglobin: 13.8 g/dL (ref 13.0–17.0)
MCHC: 33.5 g/dL (ref 30.0–36.0)
MCV: 95.1 fl (ref 78.0–100.0)
Platelets: 222 10*3/uL (ref 150.0–400.0)
RBC: 4.32 Mil/uL (ref 4.22–5.81)
RDW: 12.9 % (ref 11.5–15.5)
WBC: 5 10*3/uL (ref 4.0–10.5)

## 2021-09-07 LAB — LDL CHOLESTEROL, DIRECT: Direct LDL: 125 mg/dL

## 2021-09-07 LAB — VITAMIN D 25 HYDROXY (VIT D DEFICIENCY, FRACTURES): VITD: 19.3 ng/mL — ABNORMAL LOW (ref 30.00–100.00)

## 2021-09-07 LAB — VITAMIN B12: Vitamin B-12: >1500 pg/mL — ABNORMAL HIGH (ref 211–911)

## 2021-09-07 NOTE — Progress Notes (Signed)
Vision Screening   Right eye Left eye Both eyes  Without correction     With correction 20/20 20/100 20/20  Hearing Screening - Comments:: Passed whisper test

## 2021-09-07 NOTE — Assessment & Plan Note (Signed)
On imaging He doesn't want statin

## 2021-09-07 NOTE — Assessment & Plan Note (Signed)
BP Readings from Last 3 Encounters:  09/07/21 124/80  08/02/21 (!) 141/75  07/30/21 126/84   Good control on lisinopril 10 and amlodipine 10 daily

## 2021-09-07 NOTE — Progress Notes (Signed)
Subjective:    Patient ID: KAIEA ESSELMAN, male    DOB: 1940/05/16, 81 y.o.   MRN: [id number]  HPI Here with wife for Medicare wellness visit and follow up of chronic health conditions Reviewed advanced directives Reviewed other doctors---[facility], Dr Casandra Doffing, Dr Norina Buzzard No hospitalizations or surgery in past year Vision is okay Hearing is fair---no need for aides No alcohol or tobacco No falls No depression or anhedonic Inconsistent with exercise--wife working on him Independent with instrumental ADLs No memory problems  Doing okay Still has some trouble with vertigo Not as steady as he would like to be--but not bad  Gets kidney lesion checked yearly No action yet  Known aortic atherosclerosis  Doesn't want statin  No chest pain or SOB No dizziness or syncope No palpitations No edema  Bowels move fine with the linzess Rarely uses the hyoscyamine---for abdominal discomfort (usually at night)  Uses the omeprazole just as needed--- 1-2 per week at most No regular heartburn---post prandial bloating No dysphagia  Last GFR 47  Current Outpatient Medications on File Prior to Visit  Medication Sig Dispense Refill   amLODipine (NORVASC) 10 MG tablet Take 1 tablet by mouth once daily 90 tablet 3   cyanocobalamin (,VITAMIN B-12,) 1000 MCG/ML injection INJECT 1 ML  ONCE EVERY MONTH 3 mL 3   folic acid (FOLVITE) 1 MG tablet Take 1 tablet by mouth once daily 90 tablet 3   hyoscyamine (ANASPAZ) 0.125 MG TBDP disintergrating tablet TAKE 1 TABLET BY MOUTH THREE TIMES DAILY AS NEEDED FOR CRAMPS 60 tablet 0   LINZESS 145 MCG CAPS capsule TAKE 1 CAPSULE BY MOUTH ONCE DAILY BEFORE BREAKFAST 90 capsule 0   lisinopril (ZESTRIL) 10 MG tablet Take 1 tablet by mouth once daily 90 tablet 3   omeprazole (PRILOSEC) 40 MG capsule TAKE 1 CAPSULE BY MOUTH TWICE DAILY CAN  DECREASE  TO  DAILY  IF  SYMPTOMS  IMPROVE. 180 capsule 3   ondansetron (ZOFRAN-ODT) 4 MG  disintegrating tablet Take 4 mg by mouth every 8 (eight) hours as needed.     No current facility-administered medications on file prior to visit.    Allergies  Allergen Reactions   Dapsone Other (See Comments)   Hydrochlorothiazide Other (See Comments)   Hydrochlorothiazide W-Triamterene     REACTION: Palps REACTION: Palps   Naprosyn [Naproxen] Other (See Comments)    Insomnia, stomach upset   Sulfa Antibiotics     REACTION: unspecified   Sulfonamide Derivatives     REACTION: unspecified    Past Medical History:  Diagnosis Date   Allergy    Anemia    Hypertension    Osteoarthritis     Past Surgical History:  Procedure Laterality Date   COLONOSCOPY WITH PROPOFOL N/A 08/05/2018   Procedure: COLONOSCOPY WITH PROPOFOL;  Surgeon: Virgel Manifold, MD;  Location: [facility] ENDOSCOPY;  Service: Endoscopy;  Laterality: N/A;   [place]. 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    Comment:    Tobacco Use   Smoking status: Never    Passive exposure: Never   Smokeless tobacco: Former    Types: Chew    Quit date: 02/05/1983  Vaping Use   Vaping Use: Never used  Substance and Sexual Activity   Alcohol use: No   Drug use: Not Currently   Sexual activity: Not on file  Other Topics Concern   Not on file  Social History Narrative   Widowed 2020   Remarried 2021      No living will   No health care POA-- wife or daughter should make decisions   Would accept  resuscitation attempts but no prolonged artificial ventilation   Probably would accept tube feeds         Social Determinants of Health   Financial Resource Strain: Not on file  Food Insecurity: Not on file  Transportation Needs: Not on file  Physical Activity: Not on file  Stress: Not on file  Social Connections: Not on file  Intimate Partner Violence: Not on file   Review of Systems Appetite is okay---mostly for ice cream Weight is stable Sleeps okay Wears seat belt Dentures---no dentist (since new lower plate made) Bowels okay with med. No blood No sig back or joint pains Has a few spots on face to be checked---is going to derm    Objective:   Physical Exam Constitutional:      Appearance: Normal appearance.  HENT:     Mouth/Throat:     Comments: No lesions Eyes:     Conjunctiva/sclera: Conjunctivae normal.     Pupils: Pupils are equal, round, and reactive to light.  Cardiovascular:     Rate and Rhythm: Normal rate and regular rhythm.     Pulses: Normal pulses.     Heart sounds: No murmur heard.    No gallop.  Pulmonary:     Effort: Pulmonary effort is normal.     Breath sounds: Normal breath sounds. No wheezing or rales.  Abdominal:     Palpations: Abdomen is soft.     Tenderness: There is no abdominal tenderness.  Musculoskeletal:     Cervical back: Neck supple.     Right lower leg: No edema.     Left lower leg: No edema.  Lymphadenopathy:     Cervical: No cervical adenopathy.  Skin:    Findings: No lesion or rash.  Neurological:     General: No focal deficit present.     Mental Status: He is alert and oriented to person, place, and time.     Comments: Mini-cog---clock okay, recall 2/3  Psychiatric:        Mood and Affect: Mood normal.        Behavior: Behavior normal.            Assessment & Plan:

## 2021-09-07 NOTE — Assessment & Plan Note (Signed)
Does well with linzess 145

## 2021-09-07 NOTE — Assessment & Plan Note (Signed)
Yearly imaging

## 2021-09-07 NOTE — Assessment & Plan Note (Signed)
Is on the lisinopril Will check labs

## 2021-09-07 NOTE — Assessment & Plan Note (Signed)
Takes monthly B12 injection

## 2021-09-07 NOTE — Assessment & Plan Note (Signed)
I have personally reviewed the Medicare Annual Wellness questionnaire and have noted 1. The patient's medical and social history 2. Their use of alcohol, tobacco or illicit drugs 3. Their current medications and supplements 4. The patient's functional ability including ADL's, fall risks, home safety risks and hearing or visual             impairment. 5. Diet and physical activities 6. Evidence for depression or mood disorders  The patients weight, height, BMI and visual acuity have been recorded in the chart I have made referrals, counseling and provided education to the patient based review of the above and I have provided the pt with a written personalized care plan for preventive services.  I have provided you with a copy of your personalized plan for preventive services. Please take the time to review along with your updated medication list.  Healthy--but needs to increase exercise Getting 2nd shingrix soon Updated COVID and flu vaccine in the fall Done with cancer screening

## 2021-09-08 ENCOUNTER — Other Ambulatory Visit: Payer: Self-pay | Admitting: Internal Medicine

## 2021-09-08 DIAGNOSIS — E875 Hyperkalemia: Secondary | ICD-10-CM

## 2021-09-08 LAB — PARATHYROID HORMONE, INTACT (NO CA): PTH: 93 pg/mL — ABNORMAL HIGH (ref 16–77)

## 2021-09-10 ENCOUNTER — Other Ambulatory Visit: Payer: Self-pay | Admitting: Internal Medicine

## 2021-09-10 DIAGNOSIS — N2581 Secondary hyperparathyroidism of renal origin: Secondary | ICD-10-CM | POA: Insufficient documentation

## 2021-09-10 DIAGNOSIS — N1832 Chronic kidney disease, stage 3b: Secondary | ICD-10-CM

## 2021-09-10 NOTE — Assessment & Plan Note (Signed)
PTH elevated Starting vitamin D and will refer to nephrology since renal function worsened

## 2021-09-14 ENCOUNTER — Other Ambulatory Visit (INDEPENDENT_AMBULATORY_CARE_PROVIDER_SITE_OTHER): Payer: Medicare Other

## 2021-09-14 DIAGNOSIS — E875 Hyperkalemia: Secondary | ICD-10-CM | POA: Diagnosis not present

## 2021-09-14 LAB — RENAL FUNCTION PANEL
Albumin: 4.3 g/dL (ref 3.5–5.2)
BUN: 19 mg/dL (ref 6–23)
CO2: 24 mEq/L (ref 19–32)
Calcium: 9 mg/dL (ref 8.4–10.5)
Chloride: 108 mEq/L (ref 96–112)
Creatinine, Ser: 1.73 mg/dL — ABNORMAL HIGH (ref 0.40–1.50)
GFR: 36.77 mL/min — ABNORMAL LOW (ref >60.00)
Glucose, Bld: 103 mg/dL — ABNORMAL HIGH (ref 70–99)
Phosphorus: 3.4 mg/dL (ref 2.3–4.6)
Potassium: 5 mEq/L (ref 3.5–5.1)
Sodium: 138 mEq/L (ref 135–145)

## 2021-10-11 ENCOUNTER — Other Ambulatory Visit: Payer: Self-pay | Admitting: Internal Medicine

## 2021-10-11 DIAGNOSIS — N1832 Chronic kidney disease, stage 3b: Secondary | ICD-10-CM | POA: Diagnosis not present

## 2021-10-11 DIAGNOSIS — R8290 Unspecified abnormal findings in urine: Secondary | ICD-10-CM | POA: Diagnosis not present

## 2021-10-11 DIAGNOSIS — N2889 Other specified disorders of kidney and ureter: Secondary | ICD-10-CM | POA: Diagnosis not present

## 2021-10-11 DIAGNOSIS — R809 Proteinuria, unspecified: Secondary | ICD-10-CM | POA: Diagnosis not present

## 2021-10-11 DIAGNOSIS — N281 Cyst of kidney, acquired: Secondary | ICD-10-CM | POA: Diagnosis not present

## 2021-10-11 DIAGNOSIS — I129 Hypertensive chronic kidney disease with stage 1 through stage 4 chronic kidney disease, or unspecified chronic kidney disease: Secondary | ICD-10-CM | POA: Diagnosis not present

## 2021-10-17 ENCOUNTER — Other Ambulatory Visit: Payer: Self-pay | Admitting: Internal Medicine

## 2021-11-15 DIAGNOSIS — H52223 Regular astigmatism, bilateral: Secondary | ICD-10-CM | POA: Diagnosis not present

## 2021-11-15 DIAGNOSIS — H25813 Combined forms of age-related cataract, bilateral: Secondary | ICD-10-CM | POA: Diagnosis not present

## 2021-11-18 ENCOUNTER — Other Ambulatory Visit: Payer: Self-pay | Admitting: Internal Medicine

## 2021-11-28 ENCOUNTER — Telehealth: Payer: Self-pay | Admitting: Internal Medicine

## 2021-11-28 DIAGNOSIS — L570 Actinic keratosis: Secondary | ICD-10-CM | POA: Diagnosis not present

## 2021-11-28 DIAGNOSIS — C4372 Malignant melanoma of left lower limb, including hip: Secondary | ICD-10-CM | POA: Diagnosis not present

## 2021-11-28 DIAGNOSIS — D225 Melanocytic nevi of trunk: Secondary | ICD-10-CM | POA: Diagnosis not present

## 2021-11-28 DIAGNOSIS — Z859 Personal history of malignant neoplasm, unspecified: Secondary | ICD-10-CM | POA: Diagnosis not present

## 2021-11-28 DIAGNOSIS — D485 Neoplasm of uncertain behavior of skin: Secondary | ICD-10-CM | POA: Diagnosis not present

## 2021-11-28 DIAGNOSIS — L578 Other skin changes due to chronic exposure to nonionizing radiation: Secondary | ICD-10-CM | POA: Diagnosis not present

## 2021-11-28 NOTE — Telephone Encounter (Signed)
Patient called in and would like to know if there is a cheaper brand,or generic brand for medication LINZESS 145 MCG CAPS capsule?

## 2021-11-29 NOTE — Telephone Encounter (Signed)
Spoke to pt. He said he will think about it and call us back he decides to try it.

## 2021-11-30 ENCOUNTER — Other Ambulatory Visit: Payer: Self-pay | Admitting: Internal Medicine

## 2021-12-03 ENCOUNTER — Other Ambulatory Visit: Payer: Self-pay | Admitting: Internal Medicine

## 2021-12-12 DIAGNOSIS — L814 Other melanin hyperpigmentation: Secondary | ICD-10-CM | POA: Diagnosis not present

## 2021-12-12 DIAGNOSIS — C4372 Malignant melanoma of left lower limb, including hip: Secondary | ICD-10-CM | POA: Diagnosis not present

## 2021-12-12 DIAGNOSIS — L578 Other skin changes due to chronic exposure to nonionizing radiation: Secondary | ICD-10-CM | POA: Diagnosis not present

## 2021-12-18 ENCOUNTER — Telehealth: Payer: Self-pay | Admitting: Urology

## 2021-12-18 NOTE — Telephone Encounter (Signed)
Patient called and stated that Linzess 145 mcg caps is too expensive. He asked if there is a less expensive medication that can be prescribed instead. His pharmacy is [facility] in [place].

## 2021-12-18 NOTE — Telephone Encounter (Signed)
Spoke with patient and advised him to follow up with Dr Silvio Pate who has prescribed Linzess.

## 2021-12-19 ENCOUNTER — Other Ambulatory Visit: Payer: Self-pay | Admitting: Internal Medicine

## 2021-12-19 MED ORDER — LUBIPROSTONE 8 MCG PO CAPS: 8.0000 ug | ORAL_CAPSULE | Freq: Two times a day (BID) | ORAL | 11 refills | Status: DC

## 2021-12-19 NOTE — Telephone Encounter (Signed)
Patient called in and stated he would like to go ahead and have the generic brand sent in to his pharmacy. He stated to send it over to [place], [place] - [id number]. Thank you!

## 2021-12-19 NOTE — Addendum Note (Signed)
Addended by: Viviana Simpler I on: 12/19/2021 10:10 AM   Modules accepted: Orders

## 2021-12-19 NOTE — Telephone Encounter (Signed)
Spoke to pt

## 2021-12-19 NOTE — Telephone Encounter (Signed)
Let him know I sent the Rx

## 2022-01-16 ENCOUNTER — Other Ambulatory Visit: Payer: Self-pay | Admitting: Internal Medicine

## 2022-01-18 ENCOUNTER — Telehealth: Payer: Self-pay | Admitting: Internal Medicine

## 2022-01-18 NOTE — Telephone Encounter (Signed)
Patient called in and stated that lubiprostone (AMITIZA) 8 MCG capsule isn't really working and he would like to go back to [employer]. Please advise. Thank you!

## 2022-01-21 ENCOUNTER — Other Ambulatory Visit: Payer: Self-pay | Admitting: Internal Medicine

## 2022-01-22 MED ORDER — LINACLOTIDE 145 MCG PO CAPS: 145.0000 ug | ORAL_CAPSULE | Freq: Every day | ORAL | 11 refills | Status: DC

## 2022-01-22 NOTE — Telephone Encounter (Signed)
Spoke to pt to let him know I sent  in the [place] and removed Amitiza.

## 2022-01-22 NOTE — Addendum Note (Signed)
Addended by: Pilar Grammes on: 01/22/2022 10:21 AM   Modules accepted: Orders

## 2022-02-11 DIAGNOSIS — Z85820 Personal history of malignant melanoma of skin: Secondary | ICD-10-CM | POA: Diagnosis not present

## 2022-02-28 ENCOUNTER — Other Ambulatory Visit: Payer: Self-pay | Admitting: Internal Medicine

## 2022-04-11 DIAGNOSIS — N2889 Other specified disorders of kidney and ureter: Secondary | ICD-10-CM | POA: Diagnosis not present

## 2022-04-11 DIAGNOSIS — N281 Cyst of kidney, acquired: Secondary | ICD-10-CM | POA: Diagnosis not present

## 2022-04-11 DIAGNOSIS — I129 Hypertensive chronic kidney disease with stage 1 through stage 4 chronic kidney disease, or unspecified chronic kidney disease: Secondary | ICD-10-CM | POA: Diagnosis not present

## 2022-04-11 DIAGNOSIS — N1832 Chronic kidney disease, stage 3b: Secondary | ICD-10-CM | POA: Diagnosis not present

## 2022-04-16 ENCOUNTER — Telehealth: Payer: Self-pay | Admitting: Internal Medicine

## 2022-04-16 NOTE — Telephone Encounter (Signed)
Monica from [facility] called to let Dr. Silvio Pate know about the PAD (peripheral artery disease) screening performed on pt. Monica stated the quantaflo results were: Left & right foot both were mild - 0.7 (left) & 0.77 (right). Brayton Layman stated the pt is a systematic but it is required to report anything under 0.9. call back # [phone], no ext.

## 2022-04-17 ENCOUNTER — Encounter: Payer: Self-pay | Admitting: Internal Medicine

## 2022-04-17 DIAGNOSIS — I739 Peripheral vascular disease, unspecified: Secondary | ICD-10-CM | POA: Insufficient documentation

## 2022-04-17 NOTE — Telephone Encounter (Signed)
I added that to his problem list so I can address it at his next visit

## 2022-05-15 DIAGNOSIS — L578 Other skin changes due to chronic exposure to nonionizing radiation: Secondary | ICD-10-CM | POA: Diagnosis not present

## 2022-05-15 DIAGNOSIS — Z859 Personal history of malignant neoplasm, unspecified: Secondary | ICD-10-CM | POA: Diagnosis not present

## 2022-05-15 DIAGNOSIS — C4401 Basal cell carcinoma of skin of lip: Secondary | ICD-10-CM | POA: Diagnosis not present

## 2022-05-15 DIAGNOSIS — C44319 Basal cell carcinoma of skin of other parts of face: Secondary | ICD-10-CM | POA: Diagnosis not present

## 2022-05-15 DIAGNOSIS — L570 Actinic keratosis: Secondary | ICD-10-CM | POA: Diagnosis not present

## 2022-05-15 DIAGNOSIS — Z872 Personal history of diseases of the skin and subcutaneous tissue: Secondary | ICD-10-CM | POA: Diagnosis not present

## 2022-05-15 DIAGNOSIS — Z85820 Personal history of malignant melanoma of skin: Secondary | ICD-10-CM | POA: Diagnosis not present

## 2022-05-15 DIAGNOSIS — D485 Neoplasm of uncertain behavior of skin: Secondary | ICD-10-CM | POA: Diagnosis not present

## 2022-06-17 DIAGNOSIS — C44319 Basal cell carcinoma of skin of other parts of face: Secondary | ICD-10-CM | POA: Diagnosis not present

## 2022-06-17 DIAGNOSIS — Z85828 Personal history of other malignant neoplasm of skin: Secondary | ICD-10-CM | POA: Diagnosis not present

## 2022-06-17 DIAGNOSIS — C4401 Basal cell carcinoma of skin of lip: Secondary | ICD-10-CM | POA: Diagnosis not present

## 2022-06-17 DIAGNOSIS — L814 Other melanin hyperpigmentation: Secondary | ICD-10-CM | POA: Diagnosis not present

## 2022-07-15 ENCOUNTER — Ambulatory Visit
Admission: RE | Admit: 2022-07-15 | Discharge: 2022-07-15 | Disposition: A | Discharge status: Home / Self Care | Payer: Medicare Other | Source: Ambulatory Visit | Attending: Physician Assistant | Admitting: Physician Assistant

## 2022-07-15 DIAGNOSIS — N2889 Other specified disorders of kidney and ureter: Secondary | ICD-10-CM | POA: Diagnosis not present

## 2022-07-15 DIAGNOSIS — N281 Cyst of kidney, acquired: Secondary | ICD-10-CM | POA: Diagnosis not present

## 2022-07-15 DIAGNOSIS — K8020 Calculus of gallbladder without cholecystitis without obstruction: Secondary | ICD-10-CM | POA: Diagnosis not present

## 2022-07-15 MED ORDER — GADOBUTROL 1 MMOL/ML IV SOLN
7.5000 mL | Freq: Once | INTRAVENOUS | Status: AC | PRN
  Administered 2022-07-15: 7.5 mL via INTRAVENOUS

## 2022-07-21 ENCOUNTER — Other Ambulatory Visit: Payer: Self-pay | Admitting: Internal Medicine

## 2022-07-30 ENCOUNTER — Ambulatory Visit: Payer: Medicare Other | Admitting: Urology

## 2022-08-07 ENCOUNTER — Ambulatory Visit: Payer: Medicare Other | Admitting: Urology

## 2022-08-07 VITALS — BP 136/80 | HR 87 | Ht 71.0 in | Wt 200.0 lb

## 2022-08-07 DIAGNOSIS — N281 Cyst of kidney, acquired: Secondary | ICD-10-CM | POA: Diagnosis not present

## 2022-08-07 DIAGNOSIS — N2889 Other specified disorders of kidney and ureter: Secondary | ICD-10-CM

## 2022-08-07 NOTE — Progress Notes (Signed)
Marcelle Overlie Plume,acting as a scribe for Vanna Scotland, MD.,have documented all relevant documentation on the behalf of Vanna Scotland, MD,as directed by  Vanna Scotland, MD while in the presence of Vanna Scotland, MD.  08/07/2022 3:03 PM   Leda Gauze 1940-04-02 [id number]  Referring provider: Karie Schwalbe, MD [address] [place],  [place] [address]  Chief Complaint  Patient presents with   Follow-up    HPI: 82 year-old male with a personal history of right lower pole Bosniak 4 cystic renal mass who presents today for routine annual follow up with an MRI. The lesion measures 2.2 by 3.0 by 2.3, previously 2.6 by 2.1 by 2.2. The enhancing mural nodule measures 15 mm and initially measured 11 mm back in 2021.   Today, he denies any pain, hematuria, or other urinary symptoms. The patient is asymptomatic and continues to live a stable lifestyle. He reports no new symptoms or changes in his health status over the past year.   PMH: Past Medical History:  Diagnosis Date   Allergy    Anemia    Hypertension    Osteoarthritis    PAD (peripheral artery disease) (HCC)    abnormal screening by home nurse    Surgical History: Past Surgical History:  Procedure Laterality Date   COLONOSCOPY WITH PROPOFOL N/A 08/05/2018   Procedure: COLONOSCOPY WITH PROPOFOL;  Surgeon: Pasty Spillers, MD;  Location: [facility] ENDOSCOPY;  Service: Endoscopy;  Laterality: N/A;   NM MYOVIEW LTD     Aden. Myoview- apical thinning, no ischemia EF 53% 02/08    Home Medications:  Allergies as of 08/07/2022       Reactions   Dapsone Other (See Comments)   Hydrochlorothiazide Other (See Comments)   Hydrochlorothiazide W-triamterene    REACTION: Palps REACTION: Palps   Naprosyn [naproxen] Other (See Comments)   Insomnia, stomach upset   Sulfa Antibiotics    REACTION: unspecified   Sulfonamide Derivatives    REACTION: unspecified        Medication List        Accurate as of August 07, 2022  3:03 PM. If you have any questions, ask your nurse or doctor.          amLODipine 10 MG tablet Commonly known as: NORVASC Take 1 tablet by mouth once daily   cyanocobalamin 1000 MCG/ML injection Commonly known as: VITAMIN B12 INJECT 1 ML  ONCE EVERY MONTH   folic acid 1 MG tablet Commonly known as: FOLVITE Take 1 tablet by mouth once daily   hyoscyamine 0.125 MG Tbdp disintergrating tablet Commonly known as: ANASPAZ TAKE 1 TABLET BY MOUTH THREE TIMES DAILY AS NEEDED FOR CRAMPS   linaclotide 145 MCG Caps capsule Commonly known as: Linzess Take 1 capsule (145 mcg total) by mouth daily before breakfast.   lisinopril 2.5 MG tablet Commonly known as: ZESTRIL Take by mouth.   omeprazole 40 MG capsule Commonly known as: PRILOSEC TAKE 1 CAPSULE BY MOUTH TWICE DAILY CAN  DECREASE  TO  DAILY  IF  SYMPTOMS  IMPROVE.   ondansetron 4 MG disintegrating tablet Commonly known as: ZOFRAN-ODT Take 4 mg by mouth every 8 (eight) hours as needed.        Allergies:  Allergies  Allergen Reactions   Dapsone Other (See Comments)   Hydrochlorothiazide Other (See Comments)   Hydrochlorothiazide W-Triamterene     REACTION: Palps REACTION: Palps   Naprosyn [Naproxen] Other (See Comments)    Insomnia, stomach upset  Sulfa Antibiotics     REACTION: unspecified   Sulfonamide Derivatives     REACTION: unspecified    Family History: Family History  Problem Relation Age of Onset   Hypertension Mother    Osteoporosis Mother    Hypertension Father    Coronary artery disease Neg Hx    Diabetes Neg Hx    Cancer Neg Hx     Social History:  reports that he has never smoked. He has never been exposed to tobacco smoke. He quit smokeless tobacco use about 39 years ago.  His smokeless tobacco use included chew. He reports that he does not currently use drugs. He reports that he does not drink alcohol.   Physical Exam: BP 136/80   Pulse 87   Ht 5\' 11"  (1.803 m)   Wt 200 lb (90.7  kg)   BMI 27.89 kg/m   Constitutional:  Alert and oriented, No acute distress. HEENT: [place] AT, moist mucus membranes.  Trachea midline, no masses. Neurologic: Grossly intact, no focal deficits, moving all 4 extremities. Psychiatric: Normal mood and affect.  Pertinent Imaging: EXAM: MRI ABDOMEN WITHOUT AND WITH CONTRAST   TECHNIQUE: Multiplanar multisequence MR imaging of the abdomen was performed both before and after the administration of intravenous contrast.   CONTRAST:  7.90mL GADAVIST GADOBUTROL 1 MMOL/ML IV SOLN   COMPARISON:  07/16/2021   FINDINGS: Lower chest: Lung bases are clear.   Hepatobiliary: Liver is within normal limits. No suspicious/enhancing hepatic lesions.   Numerous layering gallstones (series 4/image 22), without associated inflammatory changes. No intrahepatic or extrahepatic ductal dilatation.   Pancreas:  Within normal limits.   Spleen:  Within normal limits.   Adrenals/Urinary Tract:  Adrenal glands are within normal limits.   Simple bilateral renal cysts measuring up to 5.3 cm in the medial right upper kidney (series 4/image 27), benign. Scattered hemorrhagic cysts with intrinsic T1 hyperintensity with no enhancement following contrast administration, measuring up to 2.0 cm in the medial left upper kidney (series 18/image 40), benign (Bosniak II). No follow-up is recommended.   However, there is a 2.2 x 3.0 x 2.3 cm exophytic lesion along the right lower kidney with intrinsic T1 shortening (series 18/image 51), suggesting proteinaceous/hemorrhagic debris, but with a 15 mm enhancing mural nodule along the superior aspect of the lesion (series 22/image 58) on postcontrast subtraction imaging. This remains compatible with cystic renal neoplasm (Bosniak IV), previously measuring 2.6 x 2.1 x 2.2 cm, mildly progressive.   No hydronephrosis.   Stomach/Bowel: Stomach is within normal limits.   Visualized bowel is unremarkable.    Vascular/Lymphatic:  No evidence of abdominal aortic aneurysm.   No suspicious abdominal lymphadenopathy. Single right renal artery and vein. No renal vein invasion.   Other:  No abdominal ascites.   Musculoskeletal: No focal osseous lesions.   IMPRESSION: 3.0 cm complex cystic lesion along the right lower kidney, compatible with cystic renal neoplasm (Bosniak IV). This is mildly progressive from the prior.   Single right renal artery and vein. No renal vein invasion. No regional lymphadenopathy or metastatic disease.   Cholelithiasis, without associated inflammatory changes.     Electronically Signed   By: Charline Bills M.D.   On: 07/19/2022 20:29   This was personally reviewed and I agree with the radiologic interpretation.    Assessment & Plan:    1. Bosniak 4 right renal cyst - Slight interval growth but relatively slow groth rate given his age; in particular the solid component has only enlarged from 41  to 15 mm over 3-year - Continue to favor surveillance with annual MRI - Cystic renal masses, if malignant, tend to be more indolent - Recommend follow up in a year with MRI -  Instructed to contact the office if he experiences any new symptoms such as bleeding or pain in the kidney area.  Return in about 1 year (around 08/07/2023) for MRI abdomen with and without contrast.  I have reviewed the above documentation for accuracy and completeness, and I agree with the above.   Vanna Scotland, MD    [facility] [address], [address] [place], [place] [address] [phone]

## 2022-09-11 ENCOUNTER — Ambulatory Visit (INDEPENDENT_AMBULATORY_CARE_PROVIDER_SITE_OTHER): Payer: Medicare Other | Admitting: Internal Medicine

## 2022-09-11 ENCOUNTER — Encounter: Payer: Self-pay | Admitting: Internal Medicine

## 2022-09-11 VITALS — BP 122/82 | HR 56 | Temp 97.8°F | Ht 71.75 in | Wt 189.0 lb

## 2022-09-11 DIAGNOSIS — N2581 Secondary hyperparathyroidism of renal origin: Secondary | ICD-10-CM

## 2022-09-11 DIAGNOSIS — Z0000 Encounter for general adult medical examination without abnormal findings: Secondary | ICD-10-CM

## 2022-09-11 DIAGNOSIS — D510 Vitamin B12 deficiency anemia due to intrinsic factor deficiency: Secondary | ICD-10-CM

## 2022-09-11 DIAGNOSIS — N1832 Chronic kidney disease, stage 3b: Secondary | ICD-10-CM

## 2022-09-11 DIAGNOSIS — I10 Essential (primary) hypertension: Secondary | ICD-10-CM

## 2022-09-11 DIAGNOSIS — I700 Atherosclerosis of aorta: Secondary | ICD-10-CM | POA: Diagnosis not present

## 2022-09-11 DIAGNOSIS — I739 Peripheral vascular disease, unspecified: Secondary | ICD-10-CM

## 2022-09-11 MED ORDER — ROSUVASTATIN CALCIUM 5 MG PO TABS: 5.0000 mg | ORAL_TABLET | Freq: Every day | ORAL | 3 refills | Status: DC

## 2022-09-11 NOTE — Progress Notes (Signed)
Subjective:    Patient ID: MEMPHIS SHREINER, male    DOB: 11-06-1940, 82 y.o.   MRN: [id number]  HPI Here with wife for Medicare wellness visit and follow up of chronic health conditions Reviewed advanced directives Reviewed other doctors---Dr Graham/Merritt--derm, Dr Pearletha Furl, Dr Charyl Dancer, Dr Milinda Pointer Did have Moh's surgery on left cheek---5 different procedures [facility]). No other hospitalizations or surgery Not really exercising--discussed  Vision is fine Hearing is good No alcohol or tobacco No falls No depression or anhedonia Independent with instrumental ADLs No sig memory issues  Known CKD--last GFR was 32 at nephrology PTH had been elevated last year--but normalized this year Is on vitamin D  Continues on monthly B12 injections  No recent leg pain  Uses the omeprazole as needed 2-3 days per week No dysphagia  Satisfied with linzess Keeps his bowels regular No blood  Current Outpatient Medications on File Prior to Visit  Medication Sig Dispense Refill   amLODipine (NORVASC) 10 MG tablet Take 1 tablet by mouth once daily 90 tablet 3   Cholecalciferol (VITAMIN D3) 1000 units CAPS Take 1 capsule by mouth daily.     cyanocobalamin (VITAMIN B12) 1000 MCG/ML injection INJECT 1 ML  ONCE EVERY MONTH 3 mL 3   folic acid (FOLVITE) 1 MG tablet Take 1 tablet by mouth once daily 90 tablet 2   hyoscyamine (ANASPAZ) 0.125 MG TBDP disintergrating tablet TAKE 1 TABLET BY MOUTH THREE TIMES DAILY AS NEEDED FOR CRAMPS 60 tablet 0   linaclotide (LINZESS) 145 MCG CAPS capsule Take 1 capsule (145 mcg total) by mouth daily before breakfast. 30 capsule 11   lisinopril (ZESTRIL) 2.5 MG tablet Take by mouth.     omeprazole (PRILOSEC) 40 MG capsule TAKE 1 CAPSULE BY MOUTH TWICE DAILY CAN  DECREASE  TO  DAILY  IF  SYMPTOMS  IMPROVE. 180 capsule 0   ondansetron (ZOFRAN-ODT) 4 MG disintegrating tablet Take 4 mg by mouth every 8 (eight) hours as needed.     No current  facility-administered medications on file prior to visit.    Allergies  Allergen Reactions   Dapsone Other (See Comments)   Hydrochlorothiazide Other (See Comments)   Hydrochlorothiazide W-Triamterene     REACTION: Palps REACTION: Palps   Naprosyn [Naproxen] Other (See Comments)    Insomnia, stomach upset   Sulfa Antibiotics     REACTION: unspecified   Sulfonamide Derivatives     REACTION: unspecified    Past Medical History:  Diagnosis Date   Allergy    Anemia    Hypertension    Osteoarthritis    PAD (peripheral artery disease) (HCC)    abnormal screening by home nurse    Past Surgical History:  Procedure Laterality Date   COLONOSCOPY WITH PROPOFOL N/A 08/05/2018   Procedure: COLONOSCOPY WITH PROPOFOL;  Surgeon: Pasty Spillers, MD;  Location: [facility] ENDOSCOPY;  Service: Endoscopy;  Laterality: N/A;   NM MYOVIEW LTD     Aden. 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    Comment:    Tobacco Use   Smoking status: Never    Passive exposure: Never   Smokeless tobacco: Former    Types: Chew    Quit date: 02/05/1983  Vaping Use   Vaping status: Never Used  Substance and Sexual Activity   Alcohol use: No   Drug use: Not Currently   Sexual activity: Not on file  Other Topics Concern   Not on file  Social History Narrative   Widowed 2020   Remarried 2021      No living will   No health care POA-- wife or daughter should make decisions   Would accept resuscitation  attempts but no prolonged artificial ventilation   Probably would accept tube feeds--but not sure about how long         Social Determinants of Health   Financial Resource Strain: Low Risk  (05/02/2020)   Received from [facility], [facility]   Overall Financial Resource Strain (CARDIA)    Difficulty of Paying Living Expenses: Not hard at all  Food Insecurity: No Food Insecurity (05/02/2020)   Received from [facility], [facility]   Hunger Vital Sign    Worried About Running Out of Food in the Last Year: Never true    Ran Out of Food in the Last Year: Never true  Transportation Needs: No Transportation Needs (05/02/2020)   Received from [facility], [facility]   [facility] - Transportation    Lack of Transportation (Medical): No    Lack of Transportation (Non-Medical): No  Physical Activity: Insufficiently Active (05/02/2020)   Received from [facility], [facility]   Exercise Vital Sign    Days of Exercise per Week: 3 days    Minutes of Exercise per Session: 30 min  Stress: No Stress Concern Present (05/02/2020)   Received from [facility], [facility] - Occupational Stress Questionnaire    Feeling of Stress : Not at all  Social Connections: Socially Integrated (05/02/2020)   Received from [facility], [facility]   Social Connection and Isolation Panel [NHANES]    Frequency of Communication with Friends and Family: More than three times a week    Frequency of Social Gatherings with Friends and Family: More than three times a week    Attends Religious Services: More than 4 times per year    Active Member of [employer] or Organizations: Yes    Attends Engineer, structural: More than 4 times per year    Marital Status: Married  Catering manager Violence: Not At Risk (05/02/2020)   Received from [facility], [facility]   Humiliation, Afraid, Rape, and Kick questionnaire    Fear of  Current or Ex-Partner: No    Emotionally Abused: No    Physically Abused: No    Sexually Abused: No   Review of Systems Appetite is good Weight is stable Sleeps okay---never a great sleeper Wear set belt Full dentures--hasn't needed dentist Voids okay. Stream is fine. Nocturia x 1--goes when he wakes for other reasons. Some urgency No sig back or joint pain No chest pain or SOB Occasional vertigo--uses cane for balance some of the time. No syncope     Objective:   Physical Exam Constitutional:      Appearance:  Normal appearance.  HENT:     Mouth/Throat:     Pharynx: No oropharyngeal exudate or posterior oropharyngeal erythema.  Eyes:     Conjunctiva/sclera: Conjunctivae normal.     Pupils: Pupils are equal, round, and reactive to light.  Cardiovascular:     Rate and Rhythm: Normal rate and regular rhythm.     Heart sounds: No murmur heard.    No gallop.     Comments: Faint pedal pulses--feet warm Pulmonary:     Effort: Pulmonary effort is normal.     Breath sounds: Normal breath sounds. No wheezing or rales.  Abdominal:     Palpations: Abdomen is soft.     Tenderness: There is no abdominal tenderness.  Musculoskeletal:     Cervical back: Neck supple.     Right lower leg: No edema.     Left lower leg: No edema.  Lymphadenopathy:     Cervical: No cervical adenopathy.  Skin:    Findings: No lesion or rash.  Neurological:     General: No focal deficit present.     Mental Status: He is alert and oriented to person, place, and time.     Comments: Word naming---7/1 minute Recall 2/3  Psychiatric:        Mood and Affect: Mood normal.        Behavior: Behavior normal.            Assessment & Plan:

## 2022-09-11 NOTE — Progress Notes (Signed)
Vision Screening   Right eye Left eye Both eyes  Without correction 20/20 20/100 20/20  With correction     Hearing Screening - Comments:: Passed whisper test

## 2022-09-11 NOTE — Assessment & Plan Note (Signed)
Is on the lsiinopril Follows with nephrology

## 2022-09-11 NOTE — Assessment & Plan Note (Signed)
Takes the monthly B12 injections

## 2022-09-11 NOTE — Assessment & Plan Note (Signed)
On imaging Will start low dose rosuvastatin 5mg  daily

## 2022-09-11 NOTE — Assessment & Plan Note (Addendum)
BP Readings from Last 3 Encounters:  09/11/22 122/82  08/07/22 136/80  09/07/21 124/80   Controlled with low dose lisinopril 2.5mg  daily and amlodipine 10mg 

## 2022-09-11 NOTE — Assessment & Plan Note (Signed)
PFH normalized this year on the vitamin D

## 2022-09-11 NOTE — Assessment & Plan Note (Signed)
No claudication but not walking much Will start the statin

## 2022-09-11 NOTE — Assessment & Plan Note (Signed)
I have personally reviewed the Medicare Annual Wellness questionnaire and have noted 1. The patient's medical and social history 2. Their use of alcohol, tobacco or illicit drugs 3. Their current medications and supplements 4. The patient's functional ability including ADL's, fall risks, home safety risks and hearing or visual             impairment. 5. Diet and physical activities 6. Evidence for depression or mood disorders  The patients weight, height, BMI and visual acuity have been recorded in the chart I have made referrals, counseling and provided education to the patient based review of the above and I have provided the pt with a written personalized care plan for preventive services.  I have provided you with a copy of your personalized plan for preventive services. Please take the time to review along with your updated medication list.  Done with cancer screening  Discussed exercise--especially leg strengthening Updated COVID/flu vaccines this fall RSV--if he didn't already get it

## 2022-10-08 ENCOUNTER — Other Ambulatory Visit: Payer: Self-pay | Admitting: Internal Medicine

## 2022-10-21 DIAGNOSIS — N1832 Chronic kidney disease, stage 3b: Secondary | ICD-10-CM | POA: Diagnosis not present

## 2022-10-21 DIAGNOSIS — I129 Hypertensive chronic kidney disease with stage 1 through stage 4 chronic kidney disease, or unspecified chronic kidney disease: Secondary | ICD-10-CM | POA: Diagnosis not present

## 2022-10-21 DIAGNOSIS — R809 Proteinuria, unspecified: Secondary | ICD-10-CM | POA: Diagnosis not present

## 2022-10-21 DIAGNOSIS — N2889 Other specified disorders of kidney and ureter: Secondary | ICD-10-CM | POA: Diagnosis not present

## 2022-10-21 DIAGNOSIS — N281 Cyst of kidney, acquired: Secondary | ICD-10-CM | POA: Diagnosis not present

## 2022-11-14 DIAGNOSIS — L578 Other skin changes due to chronic exposure to nonionizing radiation: Secondary | ICD-10-CM | POA: Diagnosis not present

## 2022-11-14 DIAGNOSIS — Z85820 Personal history of malignant melanoma of skin: Secondary | ICD-10-CM | POA: Diagnosis not present

## 2022-11-14 DIAGNOSIS — L308 Other specified dermatitis: Secondary | ICD-10-CM | POA: Diagnosis not present

## 2022-11-14 DIAGNOSIS — Z85828 Personal history of other malignant neoplasm of skin: Secondary | ICD-10-CM | POA: Diagnosis not present

## 2022-11-14 DIAGNOSIS — Z872 Personal history of diseases of the skin and subcutaneous tissue: Secondary | ICD-10-CM | POA: Diagnosis not present

## 2022-11-14 DIAGNOSIS — L986 Other infiltrative disorders of the skin and subcutaneous tissue: Secondary | ICD-10-CM | POA: Diagnosis not present

## 2022-11-14 DIAGNOSIS — D485 Neoplasm of uncertain behavior of skin: Secondary | ICD-10-CM | POA: Diagnosis not present

## 2022-11-14 DIAGNOSIS — Z859 Personal history of malignant neoplasm, unspecified: Secondary | ICD-10-CM | POA: Diagnosis not present

## 2022-12-04 ENCOUNTER — Other Ambulatory Visit: Payer: Self-pay | Admitting: Internal Medicine

## 2022-12-05 ENCOUNTER — Encounter: Payer: Self-pay | Admitting: Internal Medicine

## 2022-12-05 ENCOUNTER — Ambulatory Visit (INDEPENDENT_AMBULATORY_CARE_PROVIDER_SITE_OTHER): Payer: Medicare Other | Admitting: Internal Medicine

## 2022-12-05 VITALS — BP 116/70 | HR 68 | Temp 98.1°F | Ht 71.75 in | Wt 189.0 lb

## 2022-12-05 DIAGNOSIS — R1030 Lower abdominal pain, unspecified: Secondary | ICD-10-CM | POA: Insufficient documentation

## 2022-12-05 NOTE — Assessment & Plan Note (Signed)
For several days Bowels okay with linzess and eating fine Likely a flare of his IBS with the bloating, etc Okay to use the hyoscyamine prn for the cramping Gave low FODMAP info Unlikely to be sig diverticulitis (minimal tenderness)---but if worsens, I would try empiric augmentin

## 2022-12-05 NOTE — Progress Notes (Signed)
Subjective:    Patient ID: Darren Atkins, male    DOB: 31-Mar-1940, 82 y.o.   MRN: [id number]  HPI Here with wife due to abdominal symptoms  Pain flared up 4 days ago Lower abdominal cramping----gets bloated, especially after supper (has to unbutton pants) Does ease up over time Tylenol in the evening seems to ease it some  Bowels are regular with linzess daily Hyoscyamine has helped some  Hasn't been taking omeprazole regularly--just prn No heartburn  No N/V  Current Outpatient Medications on File Prior to Visit  Medication Sig Dispense Refill   amLODipine (NORVASC) 10 MG tablet Take 1 tablet by mouth once daily 90 tablet 3   Cholecalciferol (VITAMIN D3) 1000 units CAPS Take 1 capsule by mouth daily.     cyanocobalamin (VITAMIN B12) 1000 MCG/ML injection INJECT 1 ML  ONCE EVERY MONTH 3 mL 3   folic acid (FOLVITE) 1 MG tablet Take 1 tablet by mouth once daily 90 tablet 3   hyoscyamine (ANASPAZ) 0.125 MG TBDP disintergrating tablet TAKE 1 TABLET BY MOUTH THREE TIMES DAILY AS NEEDED FOR CRAMPS 60 tablet 0   linaclotide (LINZESS) 145 MCG CAPS capsule Take 1 capsule (145 mcg total) by mouth daily before breakfast. 30 capsule 11   omeprazole (PRILOSEC) 40 MG capsule TAKE 1 CAPSULE BY MOUTH TWICE DAILY CAN  DECREASE  TO  DAILY  IF  SYMPTOMS  IMPROVE. 180 capsule 0   rosuvastatin (CRESTOR) 5 MG tablet Take 1 tablet (5 mg total) by mouth daily. 90 tablet 3   ondansetron (ZOFRAN-ODT) 4 MG disintegrating tablet Take 4 mg by mouth every 8 (eight) hours as needed. (Patient not taking: Reported on 12/05/2022)     No current facility-administered medications on file prior to visit.    Allergies  Allergen Reactions   Dapsone Other (See Comments)   Hydrochlorothiazide Other (See Comments)   Hydrochlorothiazide W-Triamterene     REACTION: Palps REACTION: Palps   Naprosyn [Naproxen] Other (See Comments)    Insomnia, stomach upset   Sulfa Antibiotics     REACTION: unspecified    Sulfonamide Derivatives     REACTION: unspecified    Past Medical History:  Diagnosis Date   Allergy    Anemia    Hypertension    Osteoarthritis    PAD (peripheral artery disease) (HCC)    abnormal screening by home nurse    Past Surgical History:  Procedure Laterality Date   COLONOSCOPY WITH PROPOFOL N/A 08/05/2018   Procedure: COLONOSCOPY WITH PROPOFOL;  Surgeon: Pasty Spillers, MD;  Location: [facility] ENDOSCOPY;  Service: Endoscopy;  Laterality: N/A;   NM MYOVIEW LTD     Aden. Myoview- apical thinning, no ischemia EF 53% 02/08    Family History  Problem Relation Age of Onset   Hypertension Mother    Osteoporosis Mother    Hypertension Father    Coronary artery disease Neg Hx    Diabetes Neg Hx    Cancer Neg Hx     Social History   Socioeconomic History   Marital status: Married    Spouse name: Not on file   Number of children: 1   Years of education: Not on file   Highest education level: Not on file  Occupational History   Occupation: Retired---- Administrator, Civil Service for [employer]    Employer: RETIRED    Comment:    Tobacco Use   Smoking status: Never    Passive exposure: Never   Smokeless tobacco: Former    Types:  Dorna Bloom    Quit date: 02/05/1983  Vaping Use   Vaping status: Never Used  Substance and Sexual Activity   Alcohol use: No   Drug use: Not Currently   Sexual activity: Not on file  Other Topics Concern   Not on file  Social History Narrative   Widowed 2020   Remarried 2021      No living will   No health care POA-- wife or daughter should make decisions   Would accept resuscitation attempts but no prolonged artificial ventilation   Probably would accept tube feeds--but not sure about how long         Social Determinants of Health   Financial Resource Strain: Low Risk  (05/02/2020)   Received from [facility], [facility]   Overall Financial Resource Strain (CARDIA)    Difficulty of Paying Living Expenses: Not hard at all  Food  Insecurity: No Food Insecurity (05/02/2020)   Received from [facility], [facility]   Hunger Vital Sign    Worried About Running Out of Food in the Last Year: Never true    Ran Out of Food in the Last Year: Never true  Transportation Needs: No Transportation Needs (05/02/2020)   Received from [facility], [facility]   [facility] - Transportation    Lack of Transportation (Medical): No    Lack of Transportation (Non-Medical): No  Physical Activity: Insufficiently Active (05/02/2020)   Received from [facility], [facility]   Exercise Vital Sign    Days of Exercise per Week: 3 days    Minutes of Exercise per Session: 30 min  Stress: No Stress Concern Present (05/02/2020)   Received from [facility], [facility] - Occupational Stress Questionnaire    Feeling of Stress : Not at all  Social Connections: Socially Integrated (05/02/2020)   Received from [facility], [facility]   Social Connection and Isolation Panel [NHANES]    Frequency of Communication with Friends and Family: More than three times a week    Frequency of Social Gatherings with Friends and Family: More than three times a week    Attends Religious Services: More than 4 times per year    Active Member of [employer] or Organizations: Yes    Attends Engineer, structural: More than 4 times per year    Marital Status: Married  Catering manager Violence: Not At Risk (05/02/2020)   Received from [facility], [facility]   Humiliation, Afraid, Rape, and Kick questionnaire    Fear of Current or Ex-Partner: No    Emotionally Abused: No    Physically Abused: No    Sexually Abused: No   Review of Systems No fever Tries to be careful with eating--no clear changes Has tried beano at times (if there is gassy food) No cough or SOB    Objective:   Physical Exam Constitutional:      Appearance: Normal appearance.  Pulmonary:     Effort:  Pulmonary effort is normal.     Breath sounds: Normal breath sounds. No wheezing or rales.  Abdominal:     General: There is no distension.     Palpations: Abdomen is soft. There is no mass.     Tenderness: There is no guarding or rebound.     Comments: Very slight LLQ tenderness  Musculoskeletal:     Cervical back: Neck supple.  Lymphadenopathy:  Cervical: No cervical adenopathy.  Neurological:     Mental Status: He is alert.            Assessment & Plan:

## 2022-12-11 ENCOUNTER — Other Ambulatory Visit: Payer: Self-pay | Admitting: Internal Medicine

## 2023-01-02 ENCOUNTER — Other Ambulatory Visit: Payer: Self-pay | Admitting: Internal Medicine

## 2023-01-06 ENCOUNTER — Other Ambulatory Visit: Payer: Self-pay | Admitting: Internal Medicine

## 2023-01-26 ENCOUNTER — Other Ambulatory Visit: Payer: Self-pay | Admitting: Internal Medicine

## 2023-03-19 ENCOUNTER — Telehealth: Payer: Self-pay | Admitting: Internal Medicine

## 2023-03-19 NOTE — Telephone Encounter (Signed)
Copied from CRM [id number]. Topic: General - Other >> Mar 19, 2023 10:07 AM [name] wrote: Reason for CRM: Patient called stated he received a letter from [facility] gastroenterology that he need to make an appointment for a colonoscopy. He sated he's 82 and is not sure if he needs that but would like some clarity. Requesting a call back.

## 2023-03-20 NOTE — Telephone Encounter (Signed)
Called patient reviewed information has not had any issues or symptoms will disregard letter. He will reach out if any other questions.

## 2023-04-28 DIAGNOSIS — N2889 Other specified disorders of kidney and ureter: Secondary | ICD-10-CM | POA: Diagnosis not present

## 2023-04-28 DIAGNOSIS — R809 Proteinuria, unspecified: Secondary | ICD-10-CM | POA: Diagnosis not present

## 2023-04-28 DIAGNOSIS — N1832 Chronic kidney disease, stage 3b: Secondary | ICD-10-CM | POA: Diagnosis not present

## 2023-04-28 DIAGNOSIS — N281 Cyst of kidney, acquired: Secondary | ICD-10-CM | POA: Diagnosis not present

## 2023-04-28 DIAGNOSIS — I129 Hypertensive chronic kidney disease with stage 1 through stage 4 chronic kidney disease, or unspecified chronic kidney disease: Secondary | ICD-10-CM | POA: Diagnosis not present

## 2023-05-22 ENCOUNTER — Encounter: Payer: Self-pay | Admitting: Urology

## 2023-06-25 ENCOUNTER — Other Ambulatory Visit: Payer: Self-pay | Admitting: Internal Medicine

## 2023-08-07 ENCOUNTER — Ambulatory Visit

## 2023-08-07 ENCOUNTER — Ambulatory Visit
Admission: RE | Admit: 2023-08-07 | Discharge: 2023-08-07 | Disposition: A | Discharge status: Home / Self Care | Source: Ambulatory Visit | Attending: Urology | Admitting: Urology

## 2023-08-07 DIAGNOSIS — K8020 Calculus of gallbladder without cholecystitis without obstruction: Secondary | ICD-10-CM | POA: Diagnosis not present

## 2023-08-07 DIAGNOSIS — N2889 Other specified disorders of kidney and ureter: Secondary | ICD-10-CM | POA: Insufficient documentation

## 2023-08-07 DIAGNOSIS — I700 Atherosclerosis of aorta: Secondary | ICD-10-CM | POA: Diagnosis not present

## 2023-08-07 DIAGNOSIS — K5730 Diverticulosis of large intestine without perforation or abscess without bleeding: Secondary | ICD-10-CM | POA: Diagnosis not present

## 2023-08-07 MED ORDER — GADOBUTROL 1 MMOL/ML IV SOLN
7.5000 mL | Freq: Once | INTRAVENOUS | Status: AC | PRN
  Administered 2023-08-07: 7.5 mL via INTRAVENOUS

## 2023-08-13 ENCOUNTER — Ambulatory Visit: Payer: Self-pay | Admitting: Urology

## 2023-08-13 ENCOUNTER — Ambulatory Visit: Admitting: Physician Assistant

## 2023-08-13 VITALS — BP 161/73 | HR 76 | Ht 71.0 in | Wt 187.2 lb

## 2023-08-13 DIAGNOSIS — N2889 Other specified disorders of kidney and ureter: Secondary | ICD-10-CM

## 2023-08-13 NOTE — Progress Notes (Signed)
 08/13/2023 2:42 PM   Darren Atkins 1941-01-21 [id number]  CC: Chief Complaint  Patient presents with   Follow-up   HPI: Darren Atkins is a 83 y.o. male with PMH Bosniak 4 cystic renal mass on surveillance who presents today for annual follow-up.  He is accompanied today by his wife, who contributes to HPI.  Today he reports no hematuria, flank pain, cough, or unintentional weight loss in the past year.  He has no acute concerns today.  MRI abdomen with and without contrast dated 08/07/2023 with a stable Bosniak 4 cystic renal mass measuring 3.0 x 2.2 cm.  PMH: Past Medical History:  Diagnosis Date   Allergy    Anemia    Hypertension    Osteoarthritis    PAD (peripheral artery disease) (HCC)    abnormal screening by home nurse    Surgical History: Past Surgical History:  Procedure Laterality Date   COLONOSCOPY WITH PROPOFOL  N/A 08/05/2018   Procedure: COLONOSCOPY WITH PROPOFOL ;  Surgeon: Janalyn Keene NOVAK, MD;  Location: [facility] ENDOSCOPY;  Service: Endoscopy;  Laterality: N/A;   NM MYOVIEW LTD     Aden. Myoview- apical thinning, no ischemia EF 53% 02/08    Home Medications:  Allergies as of 08/13/2023       Reactions   Dapsone Other (See Comments)   Hydrochlorothiazide Other (See Comments)   Hydrochlorothiazide-triamterene    REACTION: Palps REACTION: Palps   Naprosyn  [naproxen ] Other (See Comments)   Insomnia, stomach upset   Sulfa Antibiotics    REACTION: unspecified   Sulfonamide Derivatives    REACTION: unspecified        Medication List        Accurate as of August 13, 2023  2:42 PM. If you have any questions, ask your nurse or doctor.          amLODipine  10 MG tablet Commonly known as: NORVASC  Take 1 tablet by mouth once daily   cyanocobalamin  1000 MCG/ML injection Commonly known as: VITAMIN B12 INJECT 1 ML  ONCE EVERY MONTH   folic acid  1 MG tablet Commonly known as: FOLVITE  Take 1 tablet by mouth once daily   hyoscyamine  0.125 MG Tbdp  disintergrating tablet Commonly known as: ANASPAZ  TAKE 1 TABLET BY MOUTH THREE TIMES DAILY AS NEEDED FOR CRAMPS   Linzess  145 MCG Caps capsule Generic drug: linaclotide  TAKE 1 CAPSULE BY MOUTH ONCE DAILY BEFORE BREAKFAST   omeprazole  40 MG capsule Commonly known as: PRILOSEC TAKE 1 CAPSULE BY MOUTH TWICE DAILY CAN  DECREASE  TO  DAILY  IF  SYMPTOMS  IMPROVE.   ondansetron  4 MG disintegrating tablet Commonly known as: ZOFRAN -ODT Take 4 mg by mouth every 8 (eight) hours as needed.   rosuvastatin  5 MG tablet Commonly known as: CRESTOR  Take 1 tablet (5 mg total) by mouth daily.   Vitamin D3 1000 units Caps Take 1 capsule by mouth daily.        Allergies:  Allergies  Allergen Reactions   Dapsone Other (See Comments)   Hydrochlorothiazide Other (See Comments)   Hydrochlorothiazide-Triamterene     REACTION: Palps REACTION: Palps   Naprosyn  [Naproxen ] Other (See Comments)    Insomnia, stomach upset   Sulfa Antibiotics     REACTION: unspecified   Sulfonamide Derivatives     REACTION: unspecified    Family History: Family History  Problem Relation Age of Onset   Hypertension Mother    Osteoporosis Mother    Hypertension Father    Coronary artery disease Neg Hx  Diabetes Neg Hx    Cancer Neg Hx     Social History:   reports that he has never smoked. He has never been exposed to tobacco smoke. He quit smokeless tobacco use about 40 years ago.  His smokeless tobacco use included chew. He reports that he does not currently use drugs. He reports that he does not drink alcohol.  Physical Exam: BP (!) 161/73   Pulse 76   Ht 5' 11 (1.803 m)   Wt 187 lb 4 oz (84.9 kg)   BMI 26.12 kg/m   Constitutional:  Alert and oriented, no acute distress, nontoxic appearing HEENT: [place], AT Cardiovascular: No clubbing, cyanosis, or edema Respiratory: Normal respiratory effort, no increased work of breathing Skin: No rashes, bruises or suspicious lesions Neurologic: Grossly intact,  no focal deficits, moving all 4 extremities Psychiatric: Normal mood and affect  Pertinent Imaging: MRI abdomen with and without contrast, 08/07/2023: CLINICAL DATA:  Cystic renal neoplasm   EXAM: MRI ABDOMEN WITHOUT AND WITH CONTRAST   TECHNIQUE: Multiplanar multisequence MR imaging of the abdomen was performed both before and after the administration of intravenous contrast.   CONTRAST:  7.5mL GADAVIST  GADOBUTROL  1 MMOL/ML IV SOLN   COMPARISON:  07/15/2022   FINDINGS: Lower chest: No acute abnormality.   Hepatobiliary: No solid liver abnormality is seen. Numerous gallstones packed in the gallbladder. No gallbladder wall thickening, or biliary dilatation.   Pancreas: Unremarkable. No pancreatic ductal dilatation or surrounding inflammatory changes.   Spleen: Normal in size without significant abnormality.   Adrenals/Urinary Tract: Adrenal glands are unremarkable. Unchanged intrinsically T1 hyperintense hemorrhagic or proteinaceous lesion arising from the inferior pole of the right kidney, again measuring 3.0 x 2.2 cm and with an eccentric, contrast enhancing nodule of the inferior margin measuring 1.3 cm (series 3, image 12, series 20, image 27). Multiple additional simple fluid signal and hemorrhagic or proteinaceous renal cortical cysts, the remainder of the findings benign and requiring no further follow-up or characterization.   Stomach/Bowel: Stomach is within normal limits. No evidence of bowel wall thickening, distention, or inflammatory changes. Descending colonic diverticulosis.   Vascular/Lymphatic: Aortic atherosclerosis. No enlarged abdominal lymph nodes.   Other: No abdominal wall hernia or abnormality. No ascites.   Musculoskeletal: No acute or significant osseous findings.   IMPRESSION: 1. Unchanged intrinsically T1 hyperintense hemorrhagic or proteinaceous lesion arising from the inferior pole of the right kidney, again measuring 3.0 x 2.2 cm and  with an eccentric, contrast enhancing nodule of the inferior margin measuring 1.3 cm. This remains consistent with a Bosniak category IV lesion with hemorrhagic or proteinaceous contents, suspicious for cystic renal cell carcinoma. 2. No evidence of renal vein invasion, lymphadenopathy, or metastatic disease in the abdomen. 3. Cholelithiasis. 4. Descending colonic diverticulosis.   Aortic Atherosclerosis (ICD10-I70.0).     Electronically Signed   By: Marolyn JONETTA Jaksch M.D.   On: 08/07/2023 14:12  I personally reviewed the images referenced above and note a stable cystic right renal mass.  Assessment & Plan:   1. Renal mass, right (Primary) Stable, no concerning urologic symptoms.  Will continue to monitor with annual scans.  Because this issue may need to be managed surgically in the future, I would prefer for him to establish with Dr. Georganne next year due to his expertise in robotics.  Will schedule this appointment after Dr. Georganne joins us  later this year.  Patient is in agreement.  Return in about 1 year (around 08/12/2024) for Annual f/u with MRI prior with Dr.  Georganne (will schedule once he arrives).  Lucie Hones, PA-C  [facility] [place] [address], [address] [place], [place] [address] [phone]

## 2023-08-15 ENCOUNTER — Telehealth: Payer: Self-pay | Admitting: Internal Medicine

## 2023-08-15 NOTE — Telephone Encounter (Signed)
 Copied from CRM [id number]. Topic: General - Call Back - No Documentation >> Aug 14, 2023  2:29 PM Darren Atkins wrote: Reason for CRM: Patient stated he missed a call from [place], agent could not locate what this might have been in regards too. Callback number [phone].

## 2023-08-29 ENCOUNTER — Ambulatory Visit

## 2023-08-29 VITALS — Ht 71.0 in | Wt 187.0 lb

## 2023-08-29 DIAGNOSIS — Z0000 Encounter for general adult medical examination without abnormal findings: Secondary | ICD-10-CM

## 2023-08-29 NOTE — Patient Instructions (Signed)
 Darren Atkins , Thank you for taking time out of your busy schedule to complete your Annual Wellness Visit with me. I enjoyed our conversation and look forward to speaking with you again next year. I, as well as your care team,  appreciate your ongoing commitment to your health goals. Please review the following plan we discussed and let me know if I can assist you in the future. Your Game plan/ To Do List    Follow up Visits: Next Medicare AWV with our clinical staff: 09/01/23 @ 9:30am televisit   Have you seen your provider in the last 6 months (3 months if uncontrolled diabetes)? No Next Office Visit with your provider: 09/15/23  Clinician Recommendations:  Aim for 30 minutes of exercise or brisk walking, 6-8 glasses of water, and 5 servings of fruits and vegetables each day.       This is a list of the screening recommended for you and due dates:  Health Maintenance  Topic Date Due   COVID-19 Vaccine (5 - 2024-25 season) 10/06/2022   Colon Cancer Screening  08/05/2023   Flu Shot  09/05/2023   Medicare Annual Wellness Visit  08/28/2024   DTaP/Tdap/Td vaccine (4 - Td or Tdap) 06/26/2031   Pneumococcal Vaccine for age over 22  Completed   Zoster (Shingles) Vaccine  Completed   Hepatitis B Vaccine  Aged Out   HPV Vaccine  Aged Out   Meningitis B Vaccine  Aged Out    Advanced directives: (Declined) Advance directive discussed with you today. Even though you declined this today, please call our office should you change your mind, and we can give you the proper paperwork for you to fill out. Advance Care Planning is important because it:  [x]  Makes sure you receive the medical care that is consistent with your values, goals, and preferences  [x]  It provides guidance to your family and loved ones and reduces their decisional burden about whether or not they are making the right decisions based on your wishes.  Follow the link provided in your after visit summary or read over the paperwork we  have mailed to you to help you started getting your Advance Directives in place. If you need assistance in completing these, please reach out to us  so that we can help you!

## 2023-08-29 NOTE — Progress Notes (Signed)
 Subjective:   Darren Atkins is a 83 y.o. who presents for a Medicare Wellness preventive visit.  As a reminder, Annual Wellness Visits don't include a physical exam, and some assessments may be limited, especially if this visit is performed virtually. We may recommend an in-person follow-up visit with your provider if needed.  Visit Complete: Virtual I connected with  Darren Atkins on 08/29/23 by a audio enabled telemedicine application and verified that I am speaking with the correct person using two identifiers.  Patient Location: Home  Provider Location: Home Office  I discussed the limitations of evaluation and management by telemedicine. The patient expressed understanding and agreed to proceed.  Vital Signs: Because this visit was a virtual/telehealth visit, some criteria may be missing or patient reported. Any vitals not documented were not able to be obtained and vitals that have been documented are patient reported.  VideoDeclined- This patient declined Librarian, academic. Therefore the visit was completed with audio only.  Persons Participating in Visit: Patient.  AWV Questionnaire: No: Patient Medicare AWV questionnaire was not completed prior to this visit.  Cardiac Risk Factors include: advanced age (>40men, >60 women);hypertension;male gender;sedentary lifestyle     Objective:    Today's Vitals   08/29/23 0848  Weight: 187 lb (84.8 kg)  Height: 5' 11 (1.803 m)   Body mass index is 26.08 kg/m.     08/29/2023    8:57 AM 08/05/2018   10:13 AM 04/29/2016   12:43 PM 04/03/2015   11:11 AM 02/01/2014    8:43 AM  Advanced Directives  Does Patient Have a Medical Advance Directive? No No No  No  No   Would patient like information on creating a medical advance directive?  No - Patient declined  Yes (MAU/Ambulatory/Procedural Areas - Information given)  Yes - Educational materials given  Yes - Educational materials given      Data saved with  a previous flowsheet row definition    Current Medications (verified) Outpatient Encounter Medications as of 08/29/2023  Medication Sig   amLODipine  (NORVASC ) 10 MG tablet Take 1 tablet by mouth once daily   Cholecalciferol (VITAMIN D3) 1000 units CAPS Take 1 capsule by mouth daily.   cyanocobalamin  (VITAMIN B12) 1000 MCG/ML injection INJECT 1 ML  ONCE EVERY MONTH   folic acid  (FOLVITE ) 1 MG tablet Take 1 tablet by mouth once daily   hyoscyamine  (ANASPAZ ) 0.125 MG TBDP disintergrating tablet TAKE 1 TABLET BY MOUTH THREE TIMES DAILY AS NEEDED FOR CRAMPS   linaclotide  (LINZESS ) 145 MCG CAPS capsule TAKE 1 CAPSULE BY MOUTH ONCE DAILY BEFORE BREAKFAST   omeprazole  (PRILOSEC) 40 MG capsule TAKE 1 CAPSULE BY MOUTH TWICE DAILY CAN  DECREASE  TO  DAILY  IF  SYMPTOMS  IMPROVE.   ondansetron  (ZOFRAN -ODT) 4 MG disintegrating tablet Take 4 mg by mouth every 8 (eight) hours as needed.   rosuvastatin  (CRESTOR ) 5 MG tablet Take 1 tablet (5 mg total) by mouth daily.   No facility-administered encounter medications on file as of 08/29/2023.    Allergies (verified) Dapsone, Hydrochlorothiazide, Hydrochlorothiazide-triamterene, Naprosyn  [naproxen ], Sulfa antibiotics, and Sulfonamide derivatives   History: Past Medical History:  Diagnosis Date   Allergy    Anemia    Hypertension    Osteoarthritis    PAD (peripheral artery disease) (HCC)    abnormal screening by home nurse   Past Surgical History:  Procedure Laterality Date   COLONOSCOPY WITH PROPOFOL  N/A 08/05/2018   Procedure: COLONOSCOPY WITH PROPOFOL ;  Surgeon:  Janalyn Keene NOVAK, MD;  Location: [facility] ENDOSCOPY;  Service: Endoscopy;  Laterality: N/A;   NM MYOVIEW LTD     Aden. 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    Employer: RETIRED    Comment:    Tobacco Use   Smoking status: Never    Passive exposure: Never   Smokeless tobacco: Former    Types: Chew    Quit date: 02/05/1983  Vaping Use   Vaping status: Never Used  Substance and Sexual Activity   Alcohol use: No   Drug use: Not Currently   Sexual activity: Not on file  Other Topics Concern   Not on file  Social History Narrative   Widowed 2020   Remarried 2021      No living will   No health care POA-- wife or daughter should make decisions   Would accept resuscitation attempts but no prolonged artificial ventilation   Probably would accept tube feeds--but not sure about how long         Social Drivers of Corporate investment banker Strain: Low Risk  (08/29/2023)   Overall Financial Resource Strain (CARDIA)    Difficulty of Paying Living Expenses: Not hard at all  Food Insecurity: No Food Insecurity (08/29/2023)   Hunger Vital Sign    Worried About Running Out of Food in the Last Year: Never true    Ran Out of Food in the Last Year: Never true  Transportation Needs: No Transportation Needs (08/29/2023)   PRAPARE - Administrator, Civil Service (Medical): No    Lack of Transportation (Non-Medical): No  Physical Activity: Inactive (08/29/2023)   Exercise Vital Sign    Days of Exercise per Week: 0 days    Minutes of Exercise per Session: 0 min  Stress: No Stress Concern Present (08/29/2023)   [employer] of Occupational Health - Occupational Stress Questionnaire    Feeling of Stress: Not at all  Social Connections: Socially Integrated (08/29/2023)   Social Connection and Isolation Panel    Frequency of Communication with Friends and Family: Three times a week     Frequency of Social Gatherings with Friends and Family: Twice a week    Attends Religious Services: 1 to 4 times per year    Active Member of [employer] or Organizations: Yes    Attends Banker Meetings: 1 to 4 times per year    Marital Status: Married    Tobacco Counseling Counseling given: Not Answered    Clinical Intake:  Pre-visit preparation completed: Yes  Pain : No/denies pain     BMI - recorded: 26.08 Nutritional Status: BMI 25 -29 Overweight Nutritional Risks: None Diabetes: No  No results found for: HGBA1C   How often do you need to have someone help you when you read instructions, pamphlets, or other written materials from your doctor or pharmacy?: 1 - Never  Interpreter Needed?:  No  Comments: lives with wife Information entered by :: B.Denyce Harr,LPN   Activities of Daily Living     08/29/2023    8:58 AM  In your present state of health, do you have any difficulty performing the following activities:  Hearing? 0  Vision? 0  Difficulty concentrating or making decisions? 0  Walking or climbing stairs? 0  Dressing or bathing? 0  Doing errands, shopping? 0  Preparing Food and eating ? N  Using the Toilet? N  In the past six months, have you accidently leaked urine? N  Do you have problems with loss of bowel control? N  Managing your Medications? N  Managing your Finances? N  Housekeeping or managing your Housekeeping? N    Patient Care Team: Jimmy Charlie FERNS, MD as PCP [facility]  I have updated your Care Teams any recent Medical Services you may have received from other providers in the past year.     Assessment:   This is a routine wellness examination for Darren Atkins.  Hearing/Vision screen Hearing Screening - Comments:: Pt says his hearing is good Vision Screening - Comments:: Pt says his vision is good w/glasses  Dr Jaunita [place]   Goals Addressed   None    Depression Screen      08/29/2023    8:54 AM 09/11/2022    9:19 AM 09/11/2022    8:52 AM 09/07/2021   10:18 AM 09/06/2020    9:59 AM 09/01/2019   12:16 PM 08/24/2018   12:52 PM  PHQ 2/9 Scores  PHQ - 2 Score 0 0 0 0 0 0 0    Fall Risk     08/29/2023    8:51 AM 09/11/2022    9:19 AM 09/11/2022    8:51 AM 09/07/2021   10:18 AM 09/06/2020    9:59 AM  Fall Risk   Falls in the past year? 0 0 0 0 1  Number falls in past yr: 0  0  0  Injury with Fall? 0  0  0  Risk for fall due to : No Fall Risks  No Fall Risks    Follow up Education provided;Falls prevention discussed  Falls evaluation completed      MEDICARE RISK AT HOME:  Medicare Risk at Home Any stairs in or around the home?: Yes If so, are there any without handrails?: Yes Home free of loose throw rugs in walkways, pet beds, electrical cords, etc?: Yes Adequate lighting in your home to reduce risk of falls?: Yes Life alert?: No Use of a cane, walker or w/c?: No Grab bars in the bathroom?: Yes Shower chair or bench in shower?: Yes Elevated toilet seat or a handicapped toilet?: No  TIMED UP AND GO:  Was the test performed?  No  Cognitive Function: 6CIT completed        08/29/2023    8:59 AM  6CIT Screen  What Year? 0 points  What month? 0 points  What time? 0 points  Count back from 20 0 points  Months in reverse 0 points  Repeat phrase 0 points  Total Score 0 points    Immunizations Immunization History  Administered Date(s) Administered   Influenza Split 11/14/2010, 11/18/2012   Influenza Whole 12/05/2001, 01/17/2010   Influenza, High Dose Seasonal PF 11/08/2014, 11/24/2015, 10/31/2018   Influenza-Unspecified 12/04/2013, 10/30/2016, 11/06/2017, 11/25/2019, 10/23/2020, 11/03/2021   Moderna Covid-19 Vaccine Bivalent Booster 30yrs & up 10/23/2020   PFIZER(Purple Top)SARS-COV-2 Vaccination 02/18/2019, 03/11/2019, 12/14/2019  PNEUMOCOCCAL CONJUGATE-20 06/25/2021   Pneumococcal Conjugate-13 02/01/2014   Pneumococcal Polysaccharide-23 09/26/2006,  04/30/2017   Td 11/06/1995, 01/05/2008   Tdap 06/25/2021   Zoster Recombinant(Shingrix) 06/25/2021, 11/03/2021   Zoster, Live 10/05/2012    Screening Tests Health Maintenance  Topic Date Due   COVID-19 Vaccine (5 - 2024-25 season) 10/06/2022   Colonoscopy  08/05/2023   INFLUENZA VACCINE  09/05/2023   Medicare Annual Wellness (AWV)  08/28/2024   DTaP/Tdap/Td (4 - Td or Tdap) 06/26/2031   Pneumococcal Vaccine: 50+ Years  Completed   Zoster Vaccines- Shingrix  Completed   Hepatitis B Vaccines  Aged Out   HPV VACCINES  Aged Out   Meningococcal B Vaccine  Aged Out    Health Maintenance  Health Maintenance Due  Topic Date Due   COVID-19 Vaccine (5 - 2024-25 season) 10/06/2022   Colonoscopy  08/05/2023   Health Maintenance Items Addressed: None at this time  Additional Screening:  Vision Screening: Recommended annual ophthalmology exams for early detection of glaucoma and other disorders of the eye. Would you like a referral to an eye doctor? No    Dental Screening: Recommended annual dental exams for proper oral hygiene  Community Resource Referral / Chronic Care Management: CRR required this visit?  No   CCM required this visit?  No   Plan:    I have personally reviewed and noted the following in the patient's chart:   Medical and social history Use of alcohol, tobacco or illicit drugs  Current medications and supplements including opioid prescriptions. Patient is not currently taking opioid prescriptions. Functional ability and status Nutritional status Physical activity Advanced directives List of other physicians Hospitalizations, surgeries, and ER visits in previous 12 months Vitals Screenings to include cognitive, depression, and falls Referrals and appointments  In addition, I have reviewed and discussed with patient certain preventive protocols, quality metrics, and best practice recommendations. A written personalized care plan for preventive services  as well as general preventive health recommendations were provided to patient.   Erminio LITTIE Saris, LPN   [id number]/[id number]   After Visit Summary: (Declined) Due to this being a telephonic visit, with patients personalized plan was offered to patient but patient Declined AVS at this time   Notes: Nothing significant to report at this time.

## 2023-09-08 ENCOUNTER — Other Ambulatory Visit: Payer: Self-pay | Admitting: Internal Medicine

## 2023-09-15 ENCOUNTER — Ambulatory Visit: Payer: Medicare Other | Admitting: Internal Medicine

## 2023-09-15 ENCOUNTER — Encounter: Payer: Self-pay | Admitting: Internal Medicine

## 2023-09-15 VITALS — BP 130/84 | HR 73 | Temp 98.3°F | Ht 71.0 in | Wt 186.0 lb

## 2023-09-15 DIAGNOSIS — Z0000 Encounter for general adult medical examination without abnormal findings: Secondary | ICD-10-CM

## 2023-09-15 DIAGNOSIS — K581 Irritable bowel syndrome with constipation: Secondary | ICD-10-CM

## 2023-09-15 DIAGNOSIS — I10 Essential (primary) hypertension: Secondary | ICD-10-CM | POA: Diagnosis not present

## 2023-09-15 DIAGNOSIS — I700 Atherosclerosis of aorta: Secondary | ICD-10-CM | POA: Diagnosis not present

## 2023-09-15 DIAGNOSIS — N1832 Chronic kidney disease, stage 3b: Secondary | ICD-10-CM

## 2023-09-15 DIAGNOSIS — N2581 Secondary hyperparathyroidism of renal origin: Secondary | ICD-10-CM | POA: Diagnosis not present

## 2023-09-15 DIAGNOSIS — I739 Peripheral vascular disease, unspecified: Secondary | ICD-10-CM | POA: Diagnosis not present

## 2023-09-15 NOTE — Assessment & Plan Note (Signed)
 Doing well with the linzess .

## 2023-09-15 NOTE — Assessment & Plan Note (Signed)
 Healthy Really needs to start some walking, etc again No cancer screening due to age Flu/COVID vaccines in the fall RSV if he didn't have it

## 2023-09-15 NOTE — Progress Notes (Signed)
 Subjective:    Patient ID: Darren Atkins, male    DOB: 10-14-1940, 83 y.o.   MRN: [id number]  HPI Here for physical With wife  Doing okay Still not really exercising---just does some yard work Still does B12 injections No problems with statin--for known aortic atherosclerosis  Still follows with Dr Dennise Last GFR stable at 35 PTH again just mildly elevated--remains on the vitamin D   Current Outpatient Medications on File Prior to Visit  Medication Sig Dispense Refill   amLODipine  (NORVASC ) 10 MG tablet Take 1 tablet by mouth once daily 90 tablet 3   Cholecalciferol (VITAMIN D3) 1000 units CAPS Take 1 capsule by mouth daily.     cyanocobalamin  (VITAMIN B12) 1000 MCG/ML injection INJECT 1 ML  ONCE EVERY MONTH 3 mL 3   folic acid  (FOLVITE ) 1 MG tablet Take 1 tablet by mouth once daily 90 tablet 3   hyoscyamine  (ANASPAZ ) 0.125 MG TBDP disintergrating tablet TAKE 1 TABLET BY MOUTH THREE TIMES DAILY AS NEEDED FOR CRAMPS 60 tablet 0   linaclotide  (LINZESS ) 145 MCG CAPS capsule TAKE 1 CAPSULE BY MOUTH ONCE DAILY BEFORE BREAKFAST 30 capsule 2   omeprazole  (PRILOSEC) 40 MG capsule TAKE 1 CAPSULE BY MOUTH TWICE DAILY CAN  DECREASE  TO  DAILY  IF  SYMPTOMS  IMPROVE. 180 capsule 3   ondansetron  (ZOFRAN -ODT) 4 MG disintegrating tablet Take 4 mg by mouth every 8 (eight) hours as needed.     rosuvastatin  (CRESTOR ) 5 MG tablet Take 1 tablet by mouth once daily 90 tablet 1   No current facility-administered medications on file prior to visit.    Allergies  Allergen Reactions   Dapsone Other (See Comments)   Hydrochlorothiazide Other (See Comments)   Hydrochlorothiazide-Triamterene     REACTION: Palps REACTION: Palps   Naprosyn  [Naproxen ] Other (See Comments)    Insomnia, stomach upset   Sulfa Antibiotics     REACTION: unspecified   Sulfonamide Derivatives     REACTION: unspecified    Past Medical History:  Diagnosis Date   Allergy    Anemia    Hypertension    Osteoarthritis     PAD (peripheral artery disease) (HCC)    abnormal screening by home nurse    Past Surgical History:  Procedure Laterality Date   COLONOSCOPY WITH PROPOFOL  N/A 08/05/2018   Procedure: COLONOSCOPY WITH PROPOFOL ;  Surgeon: Janalyn Keene NOVAK, MD;  Location: [facility] ENDOSCOPY;  Service: Endoscopy;  Laterality: N/A;   NM MYOVIEW LTD     Aden. Myoview- apical thinning, no ischemia EF 53% 02/08    Family History  Problem Relation Age of Onset   Hypertension Mother    Osteoporosis Mother    Hypertension Father    Coronary artery disease Neg Hx    Diabetes Neg Hx    Cancer Neg Hx     Social History   Socioeconomic History   Marital status: Married    Spouse name: Not on file   Number of children: 1   Years of education: Not on file   Highest education level: Not on file  Occupational History   Occupation: Retired---- Administrator, Civil Service for dairy farmers    Employer: RETIRED    Comment:    Tobacco Use   Smoking status: Never    Passive exposure: Never   Smokeless tobacco: Former    Types: Chew    Quit date: 02/05/1983  Vaping Use   Vaping status: Never Used  Substance and Sexual Activity   Alcohol use: No  Drug use: Not Currently   Sexual activity: Not on file  Other Topics Concern   Not on file  Social History Narrative   Widowed 2020   Remarried 2021      No living will   No health care POA-- wife or daughter should make decisions   Would accept resuscitation attempts but no prolonged artificial ventilation   Probably would accept tube feeds--but not sure about how long         Social Drivers of Corporate investment banker Strain: Low Risk  (08/29/2023)   Overall Financial Resource Strain (CARDIA)    Difficulty of Paying Living Expenses: Not hard at all  Food Insecurity: No Food Insecurity (08/29/2023)   Hunger Vital Sign    Worried About Running Out of Food in the Last Year: Never true    Ran Out of Food in the Last Year: Never true  Transportation Needs: No  Transportation Needs (08/29/2023)   PRAPARE - Administrator, Civil Service (Medical): No    Lack of Transportation (Non-Medical): No  Physical Activity: Inactive (08/29/2023)   Exercise Vital Sign    Days of Exercise per Week: 0 days    Minutes of Exercise per Session: 0 min  Stress: No Stress Concern Present (08/29/2023)   [employer] of Occupational Health - Occupational Stress Questionnaire    Feeling of Stress: Not at all  Social Connections: Socially Integrated (08/29/2023)   Social Connection and Isolation Panel    Frequency of Communication with Friends and Family: Three times a week    Frequency of Social Gatherings with Friends and Family: Twice a week    Attends Religious Services: 1 to 4 times per year    Active Member of [employer] or Organizations: Yes    Attends Banker Meetings: 1 to 4 times per year    Marital Status: Married  Catering manager Violence: Not At Risk (08/29/2023)   Humiliation, Afraid, Rape, and Kick questionnaire    Fear of Current or Ex-Partner: No    Emotionally Abused: No    Physically Abused: No    Sexually Abused: No   Review of Systems  Constitutional:  Negative for fatigue and unexpected weight change.       Wears seat belt  HENT:  Negative for dental problem, hearing loss and trouble swallowing.        Full dentures  Eyes:  Negative for visual disturbance.       No diplopia or unilateral vision loss  Respiratory:  Negative for cough, chest tightness and shortness of breath.   Cardiovascular:  Negative for chest pain, palpitations and leg swelling.  Gastrointestinal:  Positive for constipation. Negative for blood in stool.       Bowels okay with linzess  No heartburn  Endocrine: Negative for polydipsia and polyuria.  Genitourinary:  Negative for difficulty urinating and urgency.       No sexual problems  Musculoskeletal:  Negative for arthralgias, back pain and joint swelling.  Skin:  Negative for rash.       No  recent derm visits--will go back  Allergic/Immunologic: Positive for environmental allergies. Negative for immunocompromised state.       Occ OTC med  Neurological:  Negative for dizziness, syncope, light-headedness and headaches.  Hematological:  Negative for adenopathy. Bruises/bleeds easily.  Psychiatric/Behavioral:  Negative for dysphoric mood and sleep disturbance. The patient is not nervous/anxious.        Objective:   Physical Exam Constitutional:  Appearance: Normal appearance.  HENT:     Mouth/Throat:     Pharynx: No oropharyngeal exudate or posterior oropharyngeal erythema.  Eyes:     Conjunctiva/sclera: Conjunctivae normal.     Pupils: Pupils are equal, round, and reactive to light.  Cardiovascular:     Rate and Rhythm: Normal rate and regular rhythm.     Pulses: Normal pulses.     Heart sounds: No murmur heard.    No gallop.  Pulmonary:     Effort: Pulmonary effort is normal.     Breath sounds: Normal breath sounds. No wheezing or rales.  Abdominal:     Palpations: Abdomen is soft.     Tenderness: There is no abdominal tenderness.  Musculoskeletal:     Cervical back: Neck supple.     Right lower leg: No edema.     Left lower leg: No edema.  Lymphadenopathy:     Cervical: No cervical adenopathy.  Skin:    Findings: No lesion or rash.  Neurological:     General: No focal deficit present.     Mental Status: He is alert and oriented to person, place, and time.  Psychiatric:        Mood and Affect: Mood normal.        Behavior: Behavior normal.            Assessment & Plan:

## 2023-09-15 NOTE — Assessment & Plan Note (Signed)
 BP Readings from Last 3 Encounters:  09/15/23 130/84  08/13/23 (!) 161/73  12/05/22 116/70   Better Off the lisinopril  per nephrology

## 2023-09-15 NOTE — Assessment & Plan Note (Signed)
 Tolerating rosuvastatin  5mg 

## 2023-09-15 NOTE — Assessment & Plan Note (Signed)
 Still on the vitamin D 

## 2023-09-15 NOTE — Assessment & Plan Note (Signed)
 No claudication but not walking Now on statin

## 2023-09-15 NOTE — Assessment & Plan Note (Signed)
Stable GFR

## 2023-09-18 ENCOUNTER — Other Ambulatory Visit: Payer: Self-pay | Admitting: Internal Medicine

## 2023-10-07 ENCOUNTER — Other Ambulatory Visit: Payer: Self-pay

## 2023-10-07 DIAGNOSIS — N2889 Other specified disorders of kidney and ureter: Secondary | ICD-10-CM

## 2023-10-13 ENCOUNTER — Telehealth: Payer: Self-pay | Admitting: Physician Assistant

## 2023-10-13 NOTE — Telephone Encounter (Signed)
 Patient called stating he has gotten an MRI done in July of his year. Patient states he received a call to schedule another MRI, as there was a referral put in. Patient would like to know if this is necessary, being that he just had one done. Please advise.

## 2023-10-14 NOTE — Telephone Encounter (Signed)
 Pt aware MRI is due 6/26 before his annual f/u with Garren in 7/26.   Pt voiced understanding.   Made a note on the MRI that expected due date is 07/05/24.

## 2023-11-03 DIAGNOSIS — N1832 Chronic kidney disease, stage 3b: Secondary | ICD-10-CM | POA: Diagnosis not present

## 2023-11-03 DIAGNOSIS — N281 Cyst of kidney, acquired: Secondary | ICD-10-CM | POA: Diagnosis not present

## 2023-11-03 DIAGNOSIS — R8290 Unspecified abnormal findings in urine: Secondary | ICD-10-CM | POA: Diagnosis not present

## 2023-11-03 DIAGNOSIS — R809 Proteinuria, unspecified: Secondary | ICD-10-CM | POA: Diagnosis not present

## 2023-11-03 DIAGNOSIS — I129 Hypertensive chronic kidney disease with stage 1 through stage 4 chronic kidney disease, or unspecified chronic kidney disease: Secondary | ICD-10-CM | POA: Diagnosis not present

## 2023-11-03 DIAGNOSIS — N2889 Other specified disorders of kidney and ureter: Secondary | ICD-10-CM | POA: Diagnosis not present

## 2023-12-10 ENCOUNTER — Other Ambulatory Visit: Payer: Self-pay

## 2023-12-10 MED ORDER — HYOSCYAMINE SULFATE 0.125 MG PO TBDP: ORAL_TABLET | ORAL | 1 refills | Status: AC

## 2023-12-10 MED ORDER — FOLIC ACID 1 MG PO TABS: 1.0000 mg | ORAL_TABLET | Freq: Every day | ORAL | 1 refills | Status: AC

## 2023-12-10 NOTE — Telephone Encounter (Signed)
 Needs TOC with Dr Bennett sooner than the Appt he has now

## 2023-12-12 ENCOUNTER — Telehealth: Payer: Self-pay

## 2023-12-12 ENCOUNTER — Other Ambulatory Visit (HOSPITAL_COMMUNITY): Payer: Self-pay

## 2023-12-12 NOTE — Telephone Encounter (Signed)
 Pharmacy Patient Advocate Encounter   Received notification from Onbase that prior authorization for Hyoscyamine  Sulfate 0.125MG  dispersible tablets   is required/requested.   Insurance verification completed.   The patient is insured through [facility].   Per test claim: PA required; PA submitted to above mentioned insurance via Latent Key/confirmation #/EOC [id number] Status is pending

## 2023-12-15 ENCOUNTER — Other Ambulatory Visit (HOSPITAL_COMMUNITY): Payer: Self-pay

## 2023-12-17 NOTE — Telephone Encounter (Signed)
 Pharmacy Patient Advocate Encounter  Received notification from [facility] MEDICARE  that Prior Authorization for Hyoscyamine  Sulfate 0.125MG  dispersible tablets  has been DENIED.  See denial reason below. No denial letter attached in CMM. Will attach denial letter to Media tab once received.  PLEASE BE ADVISE OUTCOME FROM PLAN: Under section 1860D-2(e)(2) of the Social Security Act (the Act), certain drugs are excluded from Medicare coverage or are excluded from coverage when used to treat certain medical conditions. HYOSCYAMINE  TAB 0.125MG  IS ONE OF THE DRUGS THAT ARE EXCLUDED FROM MEDICARE COVERAGE BY LAW, AND WE DO NOT OFFER THE DRUG AS A SUPPLEMENTAL BENEFIT   PA #/Case ID/Reference #: [id number]

## 2023-12-18 ENCOUNTER — Other Ambulatory Visit: Payer: Self-pay

## 2023-12-18 MED ORDER — LINACLOTIDE 145 MCG PO CAPS: ORAL_CAPSULE | ORAL | 1 refills | Status: DC

## 2024-01-30 ENCOUNTER — Other Ambulatory Visit: Payer: Self-pay

## 2024-01-30 MED ORDER — AMLODIPINE BESYLATE 10 MG PO TABS: 10.0000 mg | ORAL_TABLET | Freq: Every day | ORAL | 0 refills | Status: AC

## 2024-02-11 ENCOUNTER — Ambulatory Visit (INDEPENDENT_AMBULATORY_CARE_PROVIDER_SITE_OTHER)

## 2024-02-11 VITALS — BP 120/78 | HR 65 | Temp 97.7°F | Ht 71.0 in | Wt 193.0 lb

## 2024-02-11 DIAGNOSIS — I10 Essential (primary) hypertension: Secondary | ICD-10-CM

## 2024-02-11 DIAGNOSIS — M545 Low back pain, unspecified: Secondary | ICD-10-CM

## 2024-02-11 DIAGNOSIS — E559 Vitamin D deficiency, unspecified: Secondary | ICD-10-CM | POA: Diagnosis not present

## 2024-02-11 DIAGNOSIS — K219 Gastro-esophageal reflux disease without esophagitis: Secondary | ICD-10-CM

## 2024-02-11 DIAGNOSIS — N2889 Other specified disorders of kidney and ureter: Secondary | ICD-10-CM | POA: Diagnosis not present

## 2024-02-11 DIAGNOSIS — D510 Vitamin B12 deficiency anemia due to intrinsic factor deficiency: Secondary | ICD-10-CM

## 2024-02-11 DIAGNOSIS — E7849 Other hyperlipidemia: Secondary | ICD-10-CM

## 2024-02-11 DIAGNOSIS — E538 Deficiency of other specified B group vitamins: Secondary | ICD-10-CM | POA: Diagnosis not present

## 2024-02-11 MED ORDER — ROSUVASTATIN CALCIUM 5 MG PO TABS: 5.0000 mg | ORAL_TABLET | Freq: Every day | ORAL | 3 refills | Status: AC

## 2024-02-11 MED ORDER — CYANOCOBALAMIN 1000 MCG/ML IJ SOLN: INTRAMUSCULAR | 11 refills | Status: AC

## 2024-02-11 MED ORDER — OMEPRAZOLE 40 MG PO CPDR: 40.0000 mg | DELAYED_RELEASE_CAPSULE | Freq: Every day | ORAL | 0 refills | Status: AC | PRN

## 2024-02-11 NOTE — Patient Instructions (Signed)
 Thank you for visiting [facility] today! Here's what we talked about: - START using Pepto-bismol first for heartburn, if that doesn't work then use the Omeprazole 

## 2024-02-11 NOTE — Progress Notes (Signed)
 "  Subjective:   This visit was conducted in person. The patient gave informed consent to the use of Abridge AI technology to record the contents of the encounter as documented below.   Patient ID: Darren Atkins, male    DOB: 04-03-1940, 84 y.o.   MRN: [id number]   Discussed the use of AI scribe software for clinical note transcription with the patient, who gave verbal consent to proceed.  History of Present Illness Darren Atkins is an 84 year old male who presents for a routine follow-up.  He has a history of hypertension, managed with lisinopril  and amlodipine , and takes Crestor  for cholesterol management.  He experiences neuropathy in his feet, described as 'a little numbness.'  He has arthritis and uses Tylenol for pain management, taking it three times a day. He notes that taking Tylenol on an empty stomach in the early morning bothers his stomach, so he now takes it with breakfast.  He follows with a nephrologist for a history of a mass on his kidney, undergoing annual MRIs, and is also under the care of a urologist at [facility].  He has experienced episodes of vertigo in the past, with the last episode occurring several months ago.  He has a history of pernicious anemia and administers B12 injections monthly at home, although he is currently overdue for his shot. He also takes folic acid  supplements.  He takes Linzess  145 mcg daily for constipation and occasionally uses hyoscyamine  for bowel issues.  He experiences heartburn and takes Prilosec as needed, approximately twice a week. He finds Pepto Bismol effective for managing symptoms. He consumes a lot of pizza, which may contribute to his heartburn.  He has a history of spinal stenosis and uses Tylenol for pain control, which he finds effective.  No current symptoms of peripheral artery disease, such as leg or toe pain.  He has a history of allergies to naproxen , sulfa antibiotics, and hydrochlorothiazide.  He does not  consume alcohol and quit using tobacco years ago, having chewed more than smoked. He remarried in 2021 and lives with his spouse.    Review of Systems  All other systems reviewed and are negative.       Allergies[1]  Medications Ordered Prior to Encounter[2]  BP 120/78 (BP Location: Left Arm, Patient Position: Sitting, Cuff Size: Large)   Pulse 65   Temp 97.7 F (36.5 C) (Oral)   Ht 5' 11 (1.803 m)   Wt 193 lb (87.5 kg)   SpO2 97%   BMI 26.92 kg/m   Objective:      Physical Exam GENERAL: Alert, cooperative, well developed, no acute distress. HEAD: Normocephalic atraumatic. EYES: Extraocular movements intact BL, pupils round, equal and reactive to light BL, conjunctivae normal BL. EXTREMITIES: No cyanosis or edema. NEUROLOGICAL: Oriented to person, place and time, no gait abnormalities, moves all extremities without gross motor or sensory deficit.         Assessment & Plan:   Assessment & Plan Gastroesophageal reflux disease Intermittent heartburn managed with Prilosec and Pepto Bismol. No known food triggers, but acidic foods and caffeine may contribute. - Prilosec as needed for heartburn. - Use Pepto Bismol as first-line treatment for heartburn. - Consider dietary modifications to reduce intake of acidic foods and caffeine.  Hypertension Managed with lisinopril  and amlodipine . - Continue lisinopril  and amlodipine  as prescribed.  Pernicious anemia Managed with monthly B12 injections. Last B12 level checked over two years ago. - Ordered fasting B12 level  - Continue monthly  B12 injections.  Folic acid  deficiency Ordered folic acid  level for monitoring - Continue folic acid  1 mg daily  Vitamin D  deficiency Vitamin D  level ordered for monitoring - Continue vitamin D  1000 units daily  HLD Well-managed on Crestor  5 mg daily.  Lipid panel ordered for monitoring. - Continue Crestor  5 mg daily  History of renal mass Renal mass with annual MRI monitoring.  Follow-up with urologist ongoing. - Continue annual MRI monitoring with [facility]   Return in about 31 weeks (around 09/15/2024) for CPE (already scheduled), fasting labs in 1week .   Ruairi Stutsman K Addalynne Golding, MD  02/11/2024     Contains text generated by Abridge.        [1]  Allergies Allergen Reactions   Dapsone Other (See Comments)   Hydrochlorothiazide Other (See Comments)   Hydrochlorothiazide-Triamterene     REACTION: Palps REACTION: Palps   Naprosyn  [Naproxen ] Other (See Comments)    Insomnia, stomach upset   Sulfa Antibiotics     REACTION: unspecified   Sulfonamide Derivatives     REACTION: unspecified  [2]  Current Outpatient Medications on File Prior to Visit  Medication Sig Dispense Refill   amLODipine  (NORVASC ) 10 MG tablet Take 1 tablet (10 mg total) by mouth daily. 90 tablet 0   Cholecalciferol (VITAMIN D3) 1000 units CAPS Take 1 capsule by mouth daily.     folic acid  (FOLVITE ) 1 MG tablet Take 1 tablet (1 mg total) by mouth daily. 90 tablet 1   hyoscyamine  (ANASPAZ ) 0.125 MG TBDP disintergrating tablet TAKE 1 TABLET BY MOUTH THREE TIMES DAILY AS NEEDED FOR CRAMPS 60 tablet 1   lisinopril  (ZESTRIL ) 2.5 MG tablet Take 2.5 mg by mouth daily.     No current facility-administered medications on file prior to visit.   "

## 2024-02-16 ENCOUNTER — Other Ambulatory Visit (INDEPENDENT_AMBULATORY_CARE_PROVIDER_SITE_OTHER)

## 2024-02-16 DIAGNOSIS — D510 Vitamin B12 deficiency anemia due to intrinsic factor deficiency: Secondary | ICD-10-CM | POA: Diagnosis not present

## 2024-02-16 DIAGNOSIS — E538 Deficiency of other specified B group vitamins: Secondary | ICD-10-CM | POA: Diagnosis not present

## 2024-02-16 DIAGNOSIS — E7849 Other hyperlipidemia: Secondary | ICD-10-CM

## 2024-02-16 DIAGNOSIS — D519 Vitamin B12 deficiency anemia, unspecified: Secondary | ICD-10-CM | POA: Diagnosis not present

## 2024-02-16 DIAGNOSIS — E559 Vitamin D deficiency, unspecified: Secondary | ICD-10-CM | POA: Diagnosis not present

## 2024-02-16 LAB — LIPID PANEL
Cholesterol: 137 mg/dL (ref 28–200)
HDL: 34.1 mg/dL — ABNORMAL LOW
LDL Cholesterol: 67 mg/dL (ref 10–99)
NonHDL: 102.9
Total CHOL/HDL Ratio: 4
Triglycerides: 181 mg/dL — ABNORMAL HIGH (ref 10.0–149.0)
VLDL: 36.2 mg/dL (ref 0.0–40.0)

## 2024-02-16 LAB — FOLATE: Folate: >23.7 ng/mL

## 2024-02-16 LAB — VITAMIN B12: Vitamin B-12: 674 pg/mL (ref 211–911)

## 2024-02-16 LAB — VITAMIN D 25 HYDROXY (VIT D DEFICIENCY, FRACTURES): VITD: 29.1 ng/mL — ABNORMAL LOW (ref 30.00–100.00)

## 2024-02-17 ENCOUNTER — Other Ambulatory Visit: Payer: Self-pay | Admitting: Family

## 2024-02-22 ENCOUNTER — Ambulatory Visit: Payer: Self-pay

## 2024-08-05 ENCOUNTER — Ambulatory Visit: Admitting: Urology

## 2024-08-31 ENCOUNTER — Ambulatory Visit

## 2024-09-08 ENCOUNTER — Other Ambulatory Visit

## 2024-09-15 ENCOUNTER — Encounter
